# Patient Record
Sex: Female | Born: 1990 | Race: White | Hispanic: No | Marital: Single | Smoking: Former smoker
Health system: Southern US, Community
[De-identification: ages and names within clinical notes are randomized; demographics above are authoritative.]

## PROBLEM LIST (undated history)

## (undated) ENCOUNTER — Inpatient Hospital Stay (HOSPITAL_COMMUNITY): Payer: Self-pay

## (undated) ENCOUNTER — Inpatient Hospital Stay (HOSPITAL_COMMUNITY): Payer: BC Managed Care – PPO

## (undated) DIAGNOSIS — F32A Depression, unspecified: Secondary | ICD-10-CM

## (undated) DIAGNOSIS — I951 Orthostatic hypotension: Secondary | ICD-10-CM

## (undated) DIAGNOSIS — R87629 Unspecified abnormal cytological findings in specimens from vagina: Secondary | ICD-10-CM

## (undated) DIAGNOSIS — R350 Frequency of micturition: Secondary | ICD-10-CM

## (undated) DIAGNOSIS — F329 Major depressive disorder, single episode, unspecified: Secondary | ICD-10-CM

## (undated) DIAGNOSIS — R51 Headache: Secondary | ICD-10-CM

## (undated) DIAGNOSIS — R102 Pelvic and perineal pain: Secondary | ICD-10-CM

## (undated) DIAGNOSIS — R569 Unspecified convulsions: Secondary | ICD-10-CM

## (undated) DIAGNOSIS — F319 Bipolar disorder, unspecified: Secondary | ICD-10-CM

## (undated) DIAGNOSIS — R351 Nocturia: Secondary | ICD-10-CM

## (undated) DIAGNOSIS — Z8742 Personal history of other diseases of the female genital tract: Secondary | ICD-10-CM

## (undated) DIAGNOSIS — B009 Herpesviral infection, unspecified: Secondary | ICD-10-CM

## (undated) DIAGNOSIS — Z915 Personal history of self-harm: Secondary | ICD-10-CM

## (undated) DIAGNOSIS — F341 Dysthymic disorder: Secondary | ICD-10-CM

## (undated) DIAGNOSIS — Z9151 Personal history of suicidal behavior: Secondary | ICD-10-CM

## (undated) DIAGNOSIS — N3010 Interstitial cystitis (chronic) without hematuria: Secondary | ICD-10-CM

## (undated) HISTORY — DX: Headache: R51

---
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
## 2009-03-28 HISTORY — PX: DILATION AND CURETTAGE OF UTERUS: SHX78

## 2009-04-06 ENCOUNTER — Emergency Department (HOSPITAL_COMMUNITY): Admission: EM | Admit: 2009-04-06 | Discharge: 2009-04-07 | Payer: Self-pay | Admitting: Emergency Medicine

## 2009-05-25 IMAGING — CT CT PELVIS W/ CM
2 of 4 series · 16 of 46 positions shown, 18 images · IV contrast (agent unspecified)
Comparison: 12/18/2006

CT ABDOMEN

CLINICAL DATA: Known UTI, increasing suprapubic, mid abdominal,
and right flank pain, leukocytosis

CT ABDOMEN AND PELVIS WITH CONTRAST
TECHNIQUE: Multidetector CT imaging of the abdomen and pelvis was
performed using the standard protocol following bolus
administration of intravenous contrast. Sagittal and coronal MPR
images reconstructed from axial data set.
Contrast: Dilute oral contrast and 100 ml [id number]

[Series 2: rtn ap with st · axial · 0.62mm/px · z∈[-617,-232]mm · 13 of 85 slices shown, 15 images]
[im 4/85  soft-tissue]
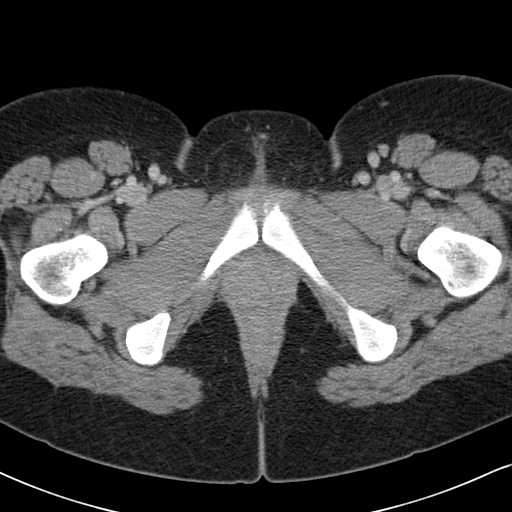
[im 4/85  bone]
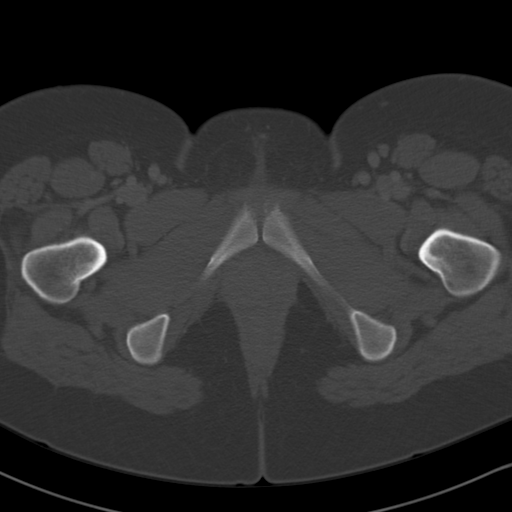
[im 10/85  soft-tissue]
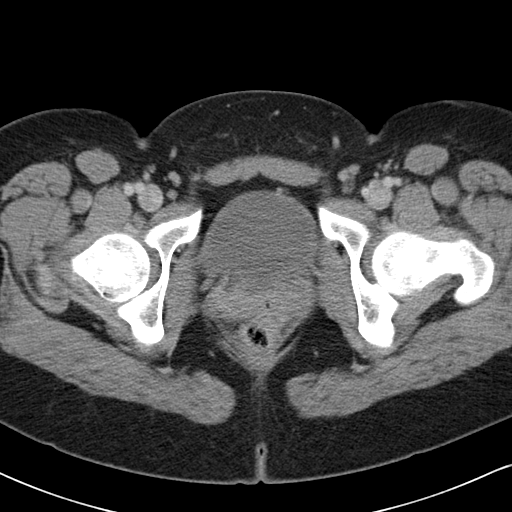
[im 17/85  soft-tissue]
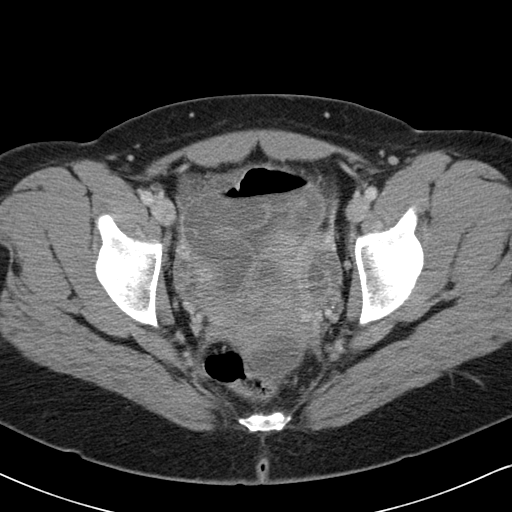
[im 23/85  soft-tissue]
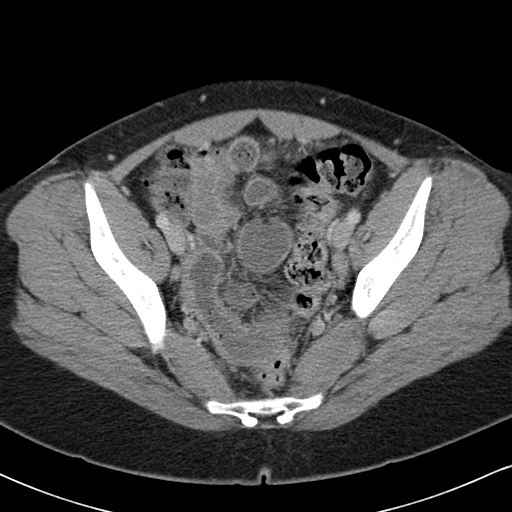
[im 30/85  soft-tissue]
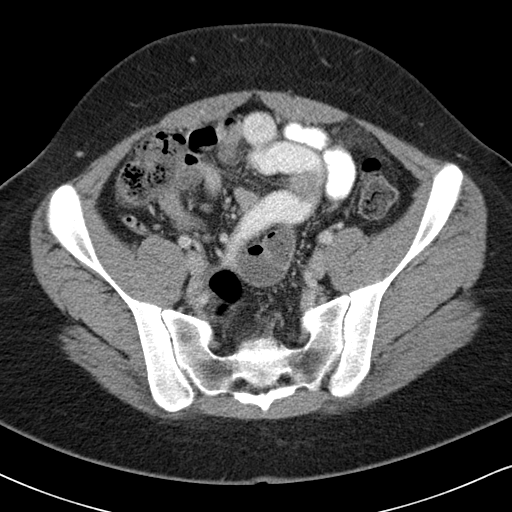
[im 36/85  soft-tissue]
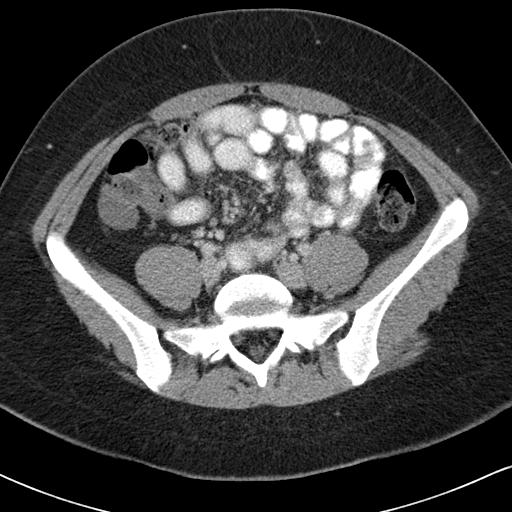
[im 43/85  soft-tissue]
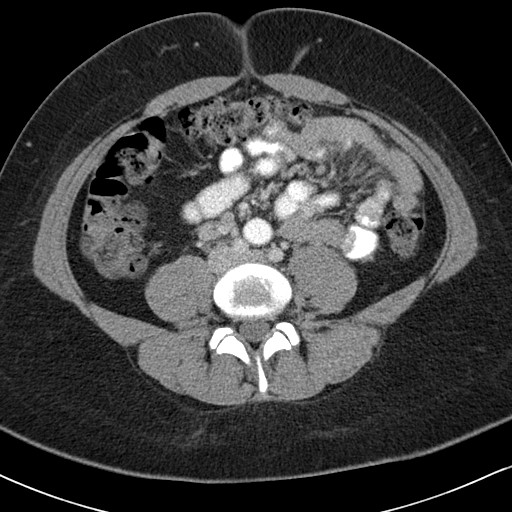
[im 49/85  soft-tissue]
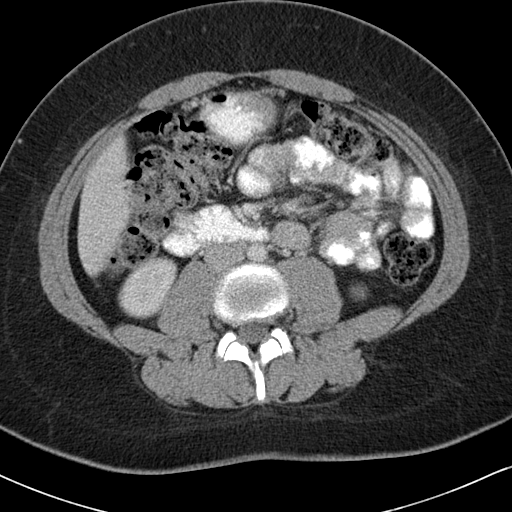
[im 55/85  soft-tissue]
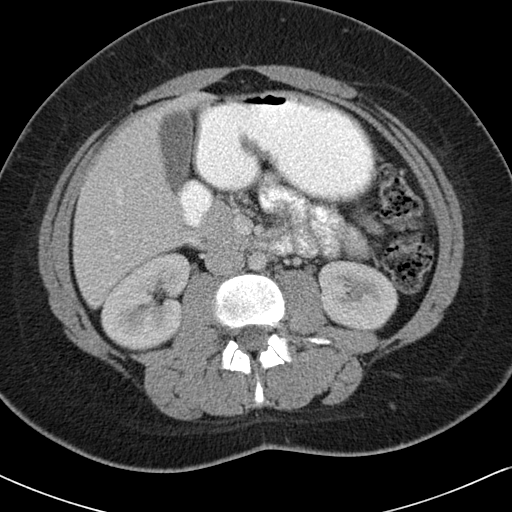
[im 55/85  bone]
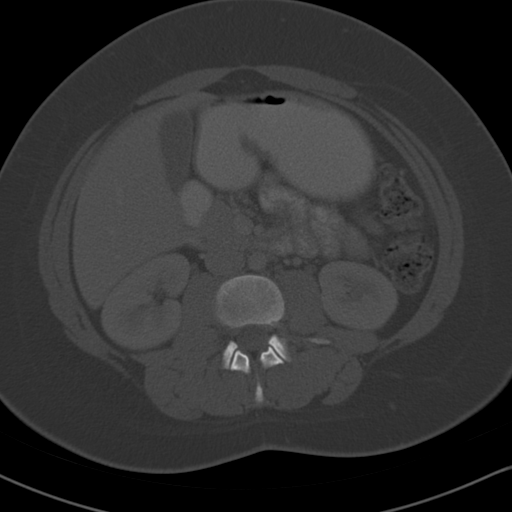
[im 62/85  soft-tissue]
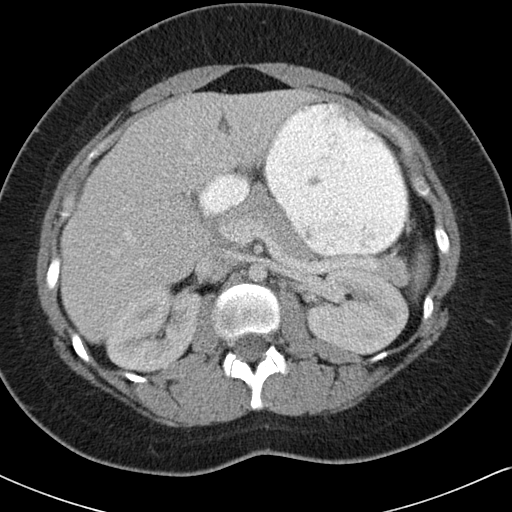
[im 68/85  soft-tissue]
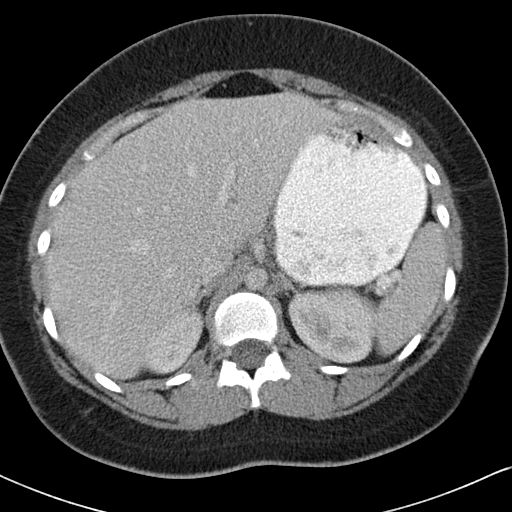
[im 75/85  soft-tissue]
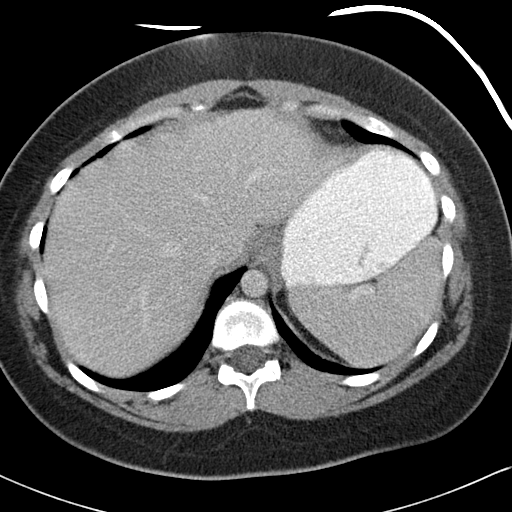
[im 81/85  soft-tissue]
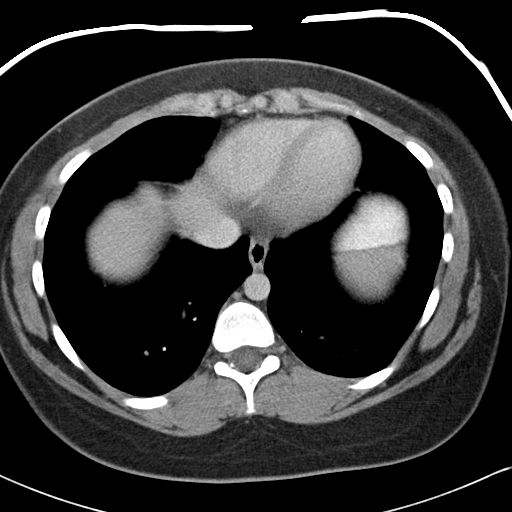

[Series 602: <mpr thick range> · coronal · 0.86mm/px · 3 of 73 slices shown]
[im 25/73  soft-tissue]
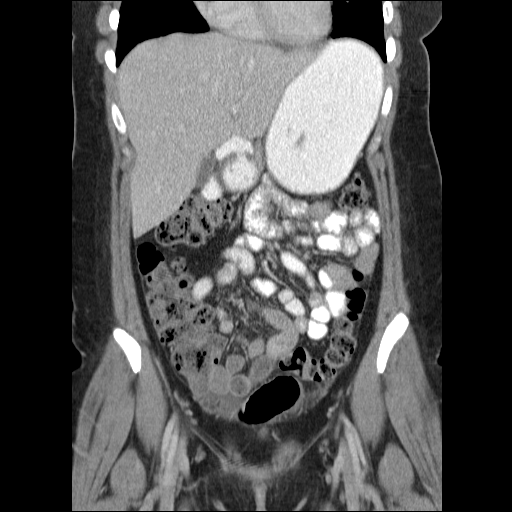
[im 33/73  soft-tissue]
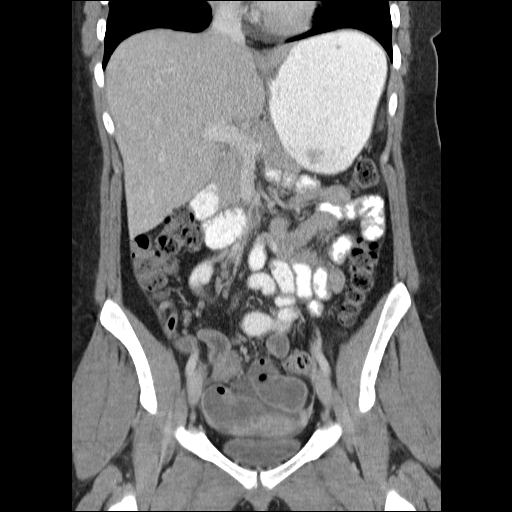
[im 41/73  soft-tissue]
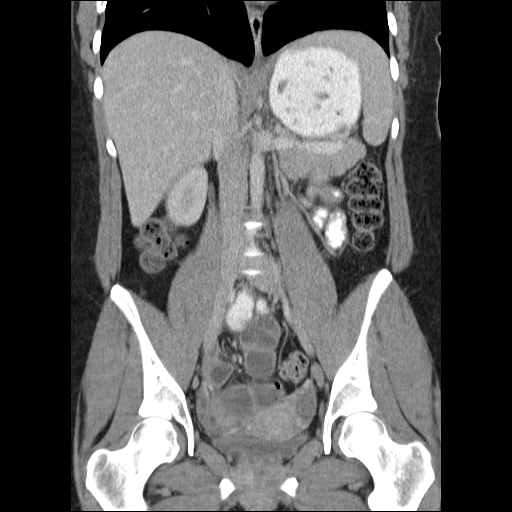

[16 of 46 positions shown; findings below may reference images not displayed]

FINDINGS: Lung bases clear.
Liver, spleen, pancreas, kidneys, and adrenal glands normal.
Symmetric nephrograms without mass, calcification, or
hydronephrosis.
No upper abdominal mass, adenopathy, or free fluid.
Scattered normal-sized lymph nodes throughout small bowel
mesentery, nonspecific.
IMPRESSION: No acute upper abdominal abnormalities, see below.

CT PELVIS
FINDINGS: Bones unremarkable.
Nonspecific free fluid in pelvis dependently and between pelvic
small bowel loops.
In addition, mild dilatation of small bowel loops in pelvis with
mildly thickened walls.
Colon unremarkable with normal appendix noted in upper right
pelvis.
Observed infiltrative changes surround uterus and adnexae without
discrete mass.
No urinary tract calcification or dilatation.
Small calcified pelvic phleboliths noted.
Minimal enhancement is identified at the peritoneal margins of the
cul-de-sac fluid.
Overall pattern is that of an inflammatory process in the pelvis,
suspect infectious, cannot exclude infection/abscess formation.
No free intraperitoneal air or evidence of bowel obstruction.
Ovaries grossly normal in size for patient of this age, containing
low attenuation foci which may represent follicles or small cysts.
Bones unremarkable.
IMPRESSION: Free fluid and apparent inflammatory process in pelvis suspicious
for infection, though uncertain as to whether this is of
gastrointestinal, gynecologic, or urinary origin.
Cannot exclude pelvic abscess.
Diffuse thickening of small bowel loops identified with hazy
mesenteric edema.

Critical test results telephoned to Dr. Stocker at the time of
interpretation on 06/19/2008 at 4585 hrs.

## 2009-08-01 ENCOUNTER — Emergency Department (HOSPITAL_COMMUNITY): Admission: EM | Admit: 2009-08-01 | Discharge: 2009-08-01 | Payer: Self-pay | Admitting: Emergency Medicine

## 2009-10-10 ENCOUNTER — Emergency Department (HOSPITAL_COMMUNITY): Admission: EM | Admit: 2009-10-10 | Discharge: 2009-10-10 | Payer: Self-pay | Admitting: Emergency Medicine

## 2009-11-23 ENCOUNTER — Ambulatory Visit: Payer: Self-pay | Admitting: Nurse Practitioner

## 2009-11-23 ENCOUNTER — Inpatient Hospital Stay (HOSPITAL_COMMUNITY): Admission: AD | Admit: 2009-11-23 | Discharge: 2009-11-23 | Payer: Self-pay | Admitting: Obstetrics and Gynecology

## 2009-11-23 DIAGNOSIS — O2390 Unspecified genitourinary tract infection in pregnancy, unspecified trimester: Secondary | ICD-10-CM

## 2009-11-23 DIAGNOSIS — N72 Inflammatory disease of cervix uteri: Secondary | ICD-10-CM

## 2009-11-23 DIAGNOSIS — N949 Unspecified condition associated with female genital organs and menstrual cycle: Secondary | ICD-10-CM

## 2009-11-25 ENCOUNTER — Inpatient Hospital Stay (HOSPITAL_COMMUNITY): Admission: AD | Admit: 2009-11-25 | Discharge: 2009-11-26 | Payer: Self-pay | Admitting: Obstetrics and Gynecology

## 2009-11-25 DIAGNOSIS — R109 Unspecified abdominal pain: Secondary | ICD-10-CM

## 2009-11-25 DIAGNOSIS — O2390 Unspecified genitourinary tract infection in pregnancy, unspecified trimester: Secondary | ICD-10-CM

## 2009-11-25 DIAGNOSIS — N760 Acute vaginitis: Secondary | ICD-10-CM

## 2009-11-25 DIAGNOSIS — A499 Bacterial infection, unspecified: Secondary | ICD-10-CM

## 2009-11-28 ENCOUNTER — Ambulatory Visit: Payer: Self-pay | Admitting: Gynecology

## 2009-11-28 ENCOUNTER — Inpatient Hospital Stay (HOSPITAL_COMMUNITY): Admission: AD | Admit: 2009-11-28 | Discharge: 2009-11-28 | Payer: Self-pay | Admitting: Obstetrics and Gynecology

## 2009-11-28 DIAGNOSIS — O368190 Decreased fetal movements, unspecified trimester, not applicable or unspecified: Secondary | ICD-10-CM

## 2009-12-12 ENCOUNTER — Inpatient Hospital Stay (HOSPITAL_COMMUNITY): Admission: AD | Admit: 2009-12-12 | Discharge: 2009-12-12 | Payer: Self-pay | Admitting: Obstetrics and Gynecology

## 2010-01-08 ENCOUNTER — Ambulatory Visit: Payer: Self-pay | Admitting: Obstetrics and Gynecology

## 2010-01-08 ENCOUNTER — Inpatient Hospital Stay (HOSPITAL_COMMUNITY): Admission: AD | Admit: 2010-01-08 | Discharge: 2010-01-08 | Payer: Self-pay | Admitting: Obstetrics and Gynecology

## 2010-01-17 ENCOUNTER — Ambulatory Visit: Payer: Self-pay | Admitting: Family

## 2010-01-17 ENCOUNTER — Inpatient Hospital Stay (HOSPITAL_COMMUNITY): Admission: AD | Admit: 2010-01-17 | Discharge: 2010-01-17 | Payer: Self-pay | Admitting: Obstetrics and Gynecology

## 2010-03-02 ENCOUNTER — Inpatient Hospital Stay (HOSPITAL_COMMUNITY)
Admission: AD | Admit: 2010-03-02 | Discharge: 2010-03-02 | Payer: Self-pay | Source: Home / Self Care | Admitting: Obstetrics and Gynecology

## 2010-03-14 ENCOUNTER — Inpatient Hospital Stay (HOSPITAL_COMMUNITY)
Admission: AD | Admit: 2010-03-14 | Discharge: 2010-03-14 | Payer: Self-pay | Source: Home / Self Care | Attending: Obstetrics and Gynecology | Admitting: Obstetrics and Gynecology

## 2010-03-25 ENCOUNTER — Inpatient Hospital Stay (HOSPITAL_COMMUNITY)
Admission: AD | Admit: 2010-03-25 | Discharge: 2010-03-25 | Payer: Self-pay | Source: Home / Self Care | Attending: Obstetrics and Gynecology | Admitting: Obstetrics and Gynecology

## 2010-03-30 ENCOUNTER — Inpatient Hospital Stay (HOSPITAL_COMMUNITY)
Admission: AD | Admit: 2010-03-30 | Discharge: 2010-04-02 | Payer: Self-pay | Source: Home / Self Care | Attending: Obstetrics and Gynecology | Admitting: Obstetrics and Gynecology

## 2010-04-01 LAB — CBC
HCT: 26.5 % — ABNORMAL LOW (ref 36.0–46.0)
Hemoglobin: 8.6 g/dL — ABNORMAL LOW (ref 12.0–15.0)
MCH: 26.9 pg (ref 26.0–34.0)
MCHC: 32.5 g/dL (ref 30.0–36.0)
MCV: 82.8 fL (ref 78.0–100.0)
Platelets: 215 10*3/uL (ref 150–400)
RBC: 3.2 MIL/uL — ABNORMAL LOW (ref 3.87–5.11)
RDW: 15.3 % (ref 11.5–15.5)
WBC: 11.9 10*3/uL — ABNORMAL HIGH (ref 4.0–10.5)

## 2010-04-02 ENCOUNTER — Encounter
Admission: RE | Admit: 2010-04-02 | Discharge: 2010-04-08 | Payer: Self-pay | Source: Home / Self Care | Attending: Obstetrics and Gynecology | Admitting: Obstetrics and Gynecology

## 2010-04-02 LAB — CBC
HCT: 27 % — ABNORMAL LOW (ref 36.0–46.0)
Hemoglobin: 8.6 g/dL — ABNORMAL LOW (ref 12.0–15.0)
MCH: 26.5 pg (ref 26.0–34.0)
MCHC: 31.9 g/dL (ref 30.0–36.0)
MCV: 83.3 fL (ref 78.0–100.0)
Platelets: 250 10*3/uL (ref 150–400)
RBC: 3.24 MIL/uL — ABNORMAL LOW (ref 3.87–5.11)
RDW: 15.4 % (ref 11.5–15.5)
WBC: 9.7 10*3/uL (ref 4.0–10.5)

## 2010-05-23 ENCOUNTER — Emergency Department (HOSPITAL_COMMUNITY): Payer: BC Managed Care – PPO

## 2010-05-23 ENCOUNTER — Emergency Department (HOSPITAL_COMMUNITY)
Admission: EM | Admit: 2010-05-23 | Discharge: 2010-05-24 | Disposition: A | Discharge status: Home / Self Care | Payer: BC Managed Care – PPO | Attending: Emergency Medicine | Admitting: Emergency Medicine

## 2010-05-23 DIAGNOSIS — R0789 Other chest pain: Secondary | ICD-10-CM | POA: Insufficient documentation

## 2010-05-23 LAB — DIFFERENTIAL
Basophils Absolute: 0 10*3/uL (ref 0.0–0.1)
Basophils Relative: 0 % (ref 0–1)
Eosinophils Absolute: 0.2 10*3/uL (ref 0.0–0.7)
Eosinophils Relative: 2 % (ref 0–5)
Lymphocytes Relative: 46 % (ref 12–46)
Lymphs Abs: 3.7 10*3/uL (ref 0.7–4.0)
Monocytes Absolute: 0.6 10*3/uL (ref 0.1–1.0)
Monocytes Relative: 8 % (ref 3–12)
Neutro Abs: 3.5 10*3/uL (ref 1.7–7.7)
Neutrophils Relative %: 44 % (ref 43–77)

## 2010-05-23 LAB — CBC
Hemoglobin: 12.1 g/dL (ref 12.0–15.0)
RBC: 4.72 MIL/uL (ref 3.87–5.11)
WBC: 8 10*3/uL (ref 4.0–10.5)

## 2010-05-23 LAB — POCT CARDIAC MARKERS
CKMB, poc: 1.3 ng/mL (ref 1.0–8.0)
Myoglobin, poc: 48 ng/mL (ref 12–200)
Troponin i, poc: <0.05 ng/mL (ref 0.00–0.09)

## 2010-05-24 LAB — URINALYSIS, ROUTINE W REFLEX MICROSCOPIC
Nitrite: NEGATIVE
Protein, ur: NEGATIVE mg/dL
Specific Gravity, Urine: 1.025 (ref 1.005–1.030)
Urobilinogen, UA: 1 mg/dL (ref 0.0–1.0)

## 2010-05-24 LAB — URINE MICROSCOPIC-ADD ON

## 2010-05-24 LAB — POCT PREGNANCY, URINE: Preg Test, Ur: NEGATIVE

## 2010-05-24 LAB — D-DIMER, QUANTITATIVE: D-Dimer, Quant: 0.47 ug/mL-FEU (ref 0.00–0.48)

## 2010-06-07 LAB — URINALYSIS, ROUTINE W REFLEX MICROSCOPIC
Bilirubin Urine: NEGATIVE
Glucose, UA: NEGATIVE mg/dL
Ketones, ur: NEGATIVE mg/dL
Specific Gravity, Urine: 1.025 (ref 1.005–1.030)
pH: 5.5 (ref 5.0–8.0)

## 2010-06-07 LAB — CBC
HCT: 37 % (ref 36.0–46.0)
Hemoglobin: 11.9 g/dL — ABNORMAL LOW (ref 12.0–15.0)
Hemoglobin: 12.2 g/dL (ref 12.0–15.0)
MCH: 26.3 pg (ref 26.0–34.0)
MCH: 26.5 pg (ref 26.0–34.0)
MCHC: 32.2 g/dL (ref 30.0–36.0)
MCHC: 32.4 g/dL (ref 30.0–36.0)
MCV: 81.9 fL (ref 78.0–100.0)
Platelets: 232 10*3/uL (ref 150–400)
Platelets: 243 10*3/uL (ref 150–400)
RBC: 4.52 MIL/uL (ref 3.87–5.11)
RDW: 14.7 % (ref 11.5–15.5)
WBC: 11.2 10*3/uL — ABNORMAL HIGH (ref 4.0–10.5)

## 2010-06-07 LAB — COMPREHENSIVE METABOLIC PANEL
ALT: 32 U/L (ref 0–35)
AST: 33 U/L (ref 0–37)
Albumin: 2.5 g/dL — ABNORMAL LOW (ref 3.5–5.2)
CO2: 21 mEq/L (ref 19–32)
Calcium: 9.1 mg/dL (ref 8.4–10.5)
Creatinine, Ser: 0.47 mg/dL (ref 0.4–1.2)
GFR calc Af Amer: >60 mL/min (ref >60)
GFR calc non Af Amer: >60 mL/min (ref >60)
Sodium: 134 mEq/L — ABNORMAL LOW (ref 135–145)
Total Protein: 5.9 g/dL — ABNORMAL LOW (ref 6.0–8.3)

## 2010-06-07 LAB — URINE MICROSCOPIC-ADD ON

## 2010-06-07 LAB — RPR: RPR Ser Ql: NONREACTIVE

## 2010-06-11 LAB — URINALYSIS, ROUTINE W REFLEX MICROSCOPIC
Bilirubin Urine: NEGATIVE
Glucose, UA: NEGATIVE mg/dL
Hgb urine dipstick: NEGATIVE
Ketones, ur: NEGATIVE mg/dL
Ketones, ur: NEGATIVE mg/dL
Nitrite: NEGATIVE
Protein, ur: NEGATIVE mg/dL
Protein, ur: NEGATIVE mg/dL
pH: 6 (ref 5.0–8.0)
pH: 6 (ref 5.0–8.0)

## 2010-06-11 LAB — WET PREP, GENITAL
Clue Cells Wet Prep HPF POC: NONE SEEN
Trich, Wet Prep: NONE SEEN
Yeast Wet Prep HPF POC: NONE SEEN

## 2010-06-11 LAB — GC/CHLAMYDIA PROBE AMP, GENITAL
Chlamydia, DNA Probe: NEGATIVE
GC Probe Amp, Genital: NEGATIVE

## 2010-06-12 LAB — WET PREP, GENITAL
Clue Cells Wet Prep HPF POC: NONE SEEN
Yeast Wet Prep HPF POC: NONE SEEN

## 2010-06-12 LAB — RPR: RPR Ser Ql: NONREACTIVE

## 2010-06-13 LAB — DIFFERENTIAL
Eosinophils Absolute: 0.2 10*3/uL (ref 0.0–0.7)
Eosinophils Relative: 2 % (ref 0–5)
Lymphocytes Relative: 33 % (ref 12–46)
Lymphs Abs: 3.3 10*3/uL (ref 0.7–4.0)
Monocytes Absolute: 0.8 10*3/uL (ref 0.1–1.0)

## 2010-06-13 LAB — URINALYSIS, ROUTINE W REFLEX MICROSCOPIC
Glucose, UA: NEGATIVE mg/dL
Hgb urine dipstick: NEGATIVE
Specific Gravity, Urine: 1.015 (ref 1.005–1.030)
pH: 6 (ref 5.0–8.0)

## 2010-06-13 LAB — URINE MICROSCOPIC-ADD ON

## 2010-06-13 LAB — CBC
HCT: 36.1 % (ref 36.0–46.0)
Hemoglobin: 11.9 g/dL — ABNORMAL LOW (ref 12.0–15.0)
MCV: 87.5 fL (ref 78.0–100.0)
WBC: 10 10*3/uL (ref 4.0–10.5)

## 2010-06-15 LAB — POCT PREGNANCY, URINE: Preg Test, Ur: POSITIVE

## 2010-06-15 LAB — GC/CHLAMYDIA PROBE AMP, GENITAL
Chlamydia, DNA Probe: NEGATIVE
GC Probe Amp, Genital: NEGATIVE

## 2010-06-15 LAB — URINALYSIS, ROUTINE W REFLEX MICROSCOPIC
Hgb urine dipstick: NEGATIVE
Nitrite: NEGATIVE
Protein, ur: NEGATIVE mg/dL
Urobilinogen, UA: 0.2 mg/dL (ref 0.0–1.0)

## 2010-06-15 LAB — CBC
HCT: 37 % (ref 36.0–46.0)
MCV: 87.7 fL (ref 78.0–100.0)
Platelets: 309 10*3/uL (ref 150–400)
RDW: 12.3 % (ref 11.5–15.5)

## 2010-06-15 LAB — WET PREP, GENITAL: Yeast Wet Prep HPF POC: NONE SEEN

## 2010-07-08 LAB — COMPREHENSIVE METABOLIC PANEL
ALT: 16 U/L (ref 0–35)
Alkaline Phosphatase: 57 U/L (ref 39–117)
CO2: 28 mEq/L (ref 19–32)
Chloride: 102 mEq/L (ref 96–112)
GFR calc non Af Amer: >60 mL/min (ref >60)
Glucose, Bld: 106 mg/dL — ABNORMAL HIGH (ref 70–99)
Potassium: 3.9 mEq/L (ref 3.5–5.1)
Sodium: 136 mEq/L (ref 135–145)

## 2010-07-08 LAB — URINE MICROSCOPIC-ADD ON

## 2010-07-08 LAB — DIFFERENTIAL
Basophils Relative: 1 % (ref 0–1)
Eosinophils Absolute: 0.2 10*3/uL (ref 0.0–0.7)
Monocytes Relative: 9 % (ref 3–12)
Neutrophils Relative %: 69 % (ref 43–77)

## 2010-07-08 LAB — URINALYSIS, ROUTINE W REFLEX MICROSCOPIC
Glucose, UA: NEGATIVE mg/dL
Ketones, ur: NEGATIVE mg/dL
Protein, ur: NEGATIVE mg/dL

## 2010-07-08 LAB — CBC
Hemoglobin: 12.3 g/dL (ref 12.0–15.0)
RBC: 4.35 MIL/uL (ref 3.87–5.11)

## 2010-07-08 LAB — WET PREP, GENITAL
Clue Cells Wet Prep HPF POC: NONE SEEN
Trich, Wet Prep: NONE SEEN
Yeast Wet Prep HPF POC: NONE SEEN

## 2010-07-08 LAB — GC/CHLAMYDIA PROBE AMP, GENITAL: Chlamydia, DNA Probe: POSITIVE — AB

## 2010-07-08 LAB — LIPASE, BLOOD: Lipase: 17 U/L (ref 11–59)

## 2010-07-13 LAB — URINALYSIS, ROUTINE W REFLEX MICROSCOPIC
Ketones, ur: 15 mg/dL — AB
Nitrite: NEGATIVE
Protein, ur: NEGATIVE mg/dL
Urobilinogen, UA: 0.2 mg/dL (ref 0.0–1.0)
pH: 5.5 (ref 5.0–8.0)

## 2010-07-13 LAB — DIFFERENTIAL
Eosinophils Absolute: 0.1 10*3/uL (ref 0.0–0.7)
Eosinophils Relative: 1 % (ref 0–5)
Lymphocytes Relative: 25 % (ref 12–46)
Lymphs Abs: 3.4 10*3/uL (ref 0.7–4.0)
Monocytes Relative: 6 % (ref 3–12)
Neutrophils Relative %: 68 % (ref 43–77)

## 2010-07-13 LAB — ETHANOL: Alcohol, Ethyl (B): <5 mg/dL (ref 0–10)

## 2010-07-13 LAB — RAPID URINE DRUG SCREEN, HOSP PERFORMED
Amphetamines: NOT DETECTED
Benzodiazepines: NOT DETECTED
Cocaine: NOT DETECTED
Tetrahydrocannabinol: NOT DETECTED

## 2010-07-13 LAB — BASIC METABOLIC PANEL
Chloride: 105 mEq/L (ref 96–112)
GFR calc Af Amer: >60 mL/min (ref >60)
GFR calc non Af Amer: >60 mL/min (ref >60)
Potassium: 3.8 mEq/L (ref 3.5–5.1)
Sodium: 139 mEq/L (ref 135–145)

## 2010-07-13 LAB — POCT PREGNANCY, URINE: Preg Test, Ur: NEGATIVE

## 2010-07-13 LAB — CBC
HCT: 39.2 % (ref 36.0–46.0)
MCV: 86.3 fL (ref 78.0–100.0)
RBC: 4.55 MIL/uL (ref 3.87–5.11)
WBC: 13.4 10*3/uL — ABNORMAL HIGH (ref 4.0–10.5)

## 2010-08-10 NOTE — Op Note (Signed)
NAME:  Kathy [name], Kathy [name]           ACCOUNT NO.:  [id number]   MEDICAL RECORD NO.:  [id number]          PATIENT TYPE:  AMB   LOCATION:  DSC                          FACILITY:  [facility]   PHYSICIAN:  Jefry H. Pollyann Kennedy, MD     DATE OF BIRTH:  1990/11/24   DATE OF PROCEDURE:  09/10/2007  DATE OF DISCHARGE:                               OPERATIVE REPORT   PREOPERATIVE DIAGNOSIS:  Chronic tonsillitis.   POSTOPERATIVE DIAGNOSIS:  Chronic tonsillitis.   PROCEDURE:  Tonsillectomy.   SURGEON:  Jefry H. Pollyann Kennedy, MD   ANESTHESIA:  General endotracheal anesthesia was used.   COMPLICATIONS:  None.   FINDINGS:  Large cryptic tonsils.  Nasopharynx clear.   BLOOD LOSS:  None.   REFERRING PHYSICIAN:  Ursula Beath, MD   HISTORY:  An 20 year old with a history of chronic and recurring  tonsillar pharyngitis.  Risks, benefits, alternatives, and complications  of the procedure were explained to the patient and her parents.  She  seemed to understand and agreed to surgery.   PROCEDURE:  The patient was taken to the operating room and placed on  the operating table in supine position.  Following induction of general  endotracheal anesthesia, the table was turned and the patient was draped  in a standard fashion.  A Crowe-Davis mouth gag was inserted into the  oral cavity used to retract the tongue and mandible and attached to Mayo  stand.  Inspection of the palate revealed no evidence of a submucous  cleft or shortening of the soft palate.  Red rubber catheter was  inserted into the right side of the nose, withdrawn through the mouth  and used to retract the soft palate and uvula.  Exam of the nasopharynx  revealed no abnormal findings.  The tonsillectomy was performed using  electrocautery dissection carefully dissecting the avascular plane  between the capsule and the constrictor muscles.  Tonsils were sent  together for pathologic evaluation.  Cautery was used for  completion of hemostasis.   The oral cavity and pharynx was irrigated  with saline and suction.  An orogastric tube was used to aspirate the  contents of the stomach.  The patient was then awakened, extubated, and  transferred to recovery in stable condition.      Jefry H. Pollyann Kennedy, MD  Electronically Signed     JHR/MEDQ  D:  09/10/2007  T:  09/10/2007  Job:  [id number]   cc:   Ursula Beath, MD

## 2010-08-13 NOTE — H&P (Signed)
NAME:  Kathy [name], Kathy [name]           ACCOUNT NO.:  [id number]   MEDICAL RECORD NO.:  [id number]          PATIENT TYPE:  INP   LOCATION:  0100                          [facility]ACILITY:  [facility]   PHYSICIAN:  Lalla Brothers, MDDATE OF BIRTH:  November 11, 1990   DATE OF ADMISSION:  05/06/2004  DATE OF DISCHARGE:                         PSYCHIATRIC ADMISSION ASSESSMENT   IDENTIFICATION:  This 87-1/20-year-old female 9th grade student at [employer] is admitted emergently voluntarily from behavioral center access  and intake where she was brought by the adoptive parents for inpatient  stabilization and treatment of suicide attempt and depression. The patient  had overdosed with 4-7 OxyContin and 4 Advil of her father's and had slumped  to the floor twice at home. She had a previous suicide attempt by smothering  herself and has a history of self-cutting including an acute laceration on  the left wrist.   HISTORY OF PRESENT ILLNESS:  The patient and mother described that the  patient has had many years of low self-esteem and self-doubt in a dependent  pattern. The patient has been rather pervasively depressed now for the last  2 years reportedly since the 7th grade year in school. The patient has  dissatisfaction and disappointment with herself, her life and her future.  She has a significant lack of fulfillment despite the encouragement and  redirection of others. She is difficult to work with in that way. The  patient formulates that she was given therapy 2 years ago but not allowed  consideration of medication at that time. She is admitted at this time on  Wellbutrin 300 mg XL every morning as well as Adderall 20 mg XR every  morning, taking the Adderall for ADHD in the Wellbutrin apparently for  depression. The patient suggests that she had some therapy at a around age  [age] as well. Adoptive mother indicates that [facility] has been  their most helpful therapy resource though she  is now retired. The patient  has several months now of increasing depression and fixation and  hopelessness and helplessness. She maintains that her adoptive parents are  overbearing, overly critical, and expecting too much of her. The patient  indicates that she would have taken more pills have a friend did not  interrupt her verbally and stop her from taking further. Mother states the  patient estimated ingesting 4-7 of the OxyContin tablets of father's but  they can only document that as many as 4 were missing. The patient asked why  her biological mother at age 27 after the patient's birth what maltreat the  patient and give her up for adoption. The patient seems to formulate that  her biological mother now has young child and that the biological father has  three children. The patient continues to ask over and over when they did not  keep her. The patient seems to propose that others are withholding nurturing  and concern from her. She does not acknowledge specific anxiety. She does  report inattention in the past but does not describe hyperactivity. She has  significant dependent features to her identity diffusion and confusion. She  does  not acknowledge hallucinations or delusions.   PAST MEDICAL HISTORY:  The patient is under the primary care of Dr. Evelena Peat at that [facility]. She has a history of  chickenpox. She does have as asthma episodes when she has upper respiratory  infections. She has occasional strep throat. She is mildly overweight but  mother describes a the patient was losing some weight on Adderall and  Wellbutrin leveled this off, while the patient states that neither really  affected her appetite, but she just stop eating apparently because of  depression. She has a laceration on the left wrist. She has scars from  previous self-lacerations on the left forearm. She has some acne vulgaris.  Her last menses was 05/03/2004. She is not sexually  active. She has no  medication allergies. At the time of admission she is taking Wellbutrin 300  mg XL every morning and Adderall 20 mg XR every morning. She has had no  seizure or syncope. She has had no heart murmur or arrhythmia.   REVIEW OF SYSTEMS:  The patient denies difficulty with gait, gaze or  continence. She denies exposure to communicable disease or toxins. She  denies rash, jaundice or purpura. There is no chest pain, palpitations or  presyncope. There is no abdominal pain, nausea, vomiting or diarrhea. There  is no dysuria or arthralgia.   IMMUNIZATIONS:  Are up-to-date.   FAMILY HISTORY:  Biological mother was age 77 when the patient was born and  was maltreating to the patient. The patient was subsequently adopted and  currently resides with biological parents. The patient is concerned the  biological mother has her own young child now and the biological father has  three children which they did keep. Nothing is known, otherwise, about her  biological family.   SOCIAL AND DEVELOPMENTAL HISTORY:  The patient is a 9th grade student at  [employer]. Adaptation to high school appears to have been  challenging particular developmentally. The patient is dressed in a morbid  fashion having stars inked in on all fingers and wearing black with heavy  black makeup and she had black hair. The patient denies use of illicit drugs  or alcohol. She denies sexual activity. She denies legal problems.   ASSETS:  The patient thinks she cannot talk in any clarifying way about her  feelings and past, but she does so quite successfully in the interview and  intervention.   MENTAL STATUS EXAM:  Height 65 inches and weight is 160 pounds with blood  pressure 137/79, heart rate 72 sitting and 130/84 with heart rate of 94  standing. The patient is right-handed. Neurological exam is intact. She is  alert and oriented with speech intact. Cranial nerves II-XII intact. Alternating  motion rates are intact. Muscle strength and tone are normal.  There is no abnormal involuntary movements. There is no neurologic soft  signs or pathologic reflexes. Gait and gaze are intact. The patient dresses  in a morbid fashion but is hopeless in her mannerism. She understates her  capability for sharing her own emotions and the content of her loss. She  presents low self-esteem and self-deprecation. She suggests a 2-year history  of chronic dysthymic dysphoria now exacerbated over a couple months with  apparent major depression. She seems to project inadequate nurturing and  validation from parents currently considering this is stressful and  critical. However, object loss extends to biological parents predominantly.  She has no hallucinations or delusions. She has no  dissociation or cognitive  impairment. She is inattentive though she is seen after taking her Adderall  making quantitation difficult. She does not have hyperactivity at this time.  She does have suicidal ideation and attempt. She is not homicidal or  assaultive   IMPRESSION:   AXIS I:  1.  Major depression, single episode, moderate to severe.  2.  Dysthymic disorder, early onset, severe with atypical features.  3.  Attention deficit hyperactivity disorder, predominantly inattentive      type, moderate severity.  4.  Identity disorder with dependent features.  5.  Parent-child problem.  6.  Other specified family circumstances.   AXIS II:  Diagnosis deferred.   AXIS III:  1.  Laceration, left wrist.  2.  Asthma by history.  3.  Slightly overweight.   AXIS IV:  Stressors family severe chronic; phase of life severe acute.   AXIS V:  Global assessment of function 35 with highest in the last year 73.   PLAN:  The patient is admitted for inpatient adolescent psychiatric and  multidisciplinary multimodal behavioral treatment in a team-based  programmatic locked psychiatric unit. I discussed Wellbutrin  pharmacotherapy  with the patient and adoptive mother separately. We will increase Wellbutrin  to 450 mg XL every morning. We will maintain Adderall currently 20 mg XR  every morning though it may be possible to discontinue Adderall when  Wellbutrin as titrated up. If Wellbutrin is not successful for mood and this  dose, then it may be best to change to a different antidepressant. Learning-  based strategies, cognitive behavioral, object relations and family,  identity consolidation, individuation separation habit reversal training  therapies are be considered and undertaken. Estimated length of stay is 5-7  days with target symptoms for discharge being stabilization of suicide risk  and mood, stabilization of neurotic perpetuation of depression in treatment  failure and generalization of the capacity for safe effective participation  in outpatient treatment.      GEJ/MEDQ  D:  05/07/2004  T:  05/07/2004  Job:  [id number]
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
## 2010-09-23 ENCOUNTER — Other Ambulatory Visit: Payer: Self-pay | Admitting: Obstetrics and Gynecology

## 2010-09-24 ENCOUNTER — Ambulatory Visit
Admission: RE | Admit: 2010-09-24 | Discharge: 2010-09-24 | Disposition: A | Discharge status: Home / Self Care | Payer: BC Managed Care – PPO | Source: Ambulatory Visit | Attending: Obstetrics and Gynecology | Admitting: Obstetrics and Gynecology

## 2010-12-23 ENCOUNTER — Other Ambulatory Visit: Payer: Self-pay | Admitting: Family Medicine

## 2010-12-23 DIAGNOSIS — R221 Localized swelling, mass and lump, neck: Secondary | ICD-10-CM

## 2010-12-24 ENCOUNTER — Ambulatory Visit
Admission: RE | Admit: 2010-12-24 | Discharge: 2010-12-24 | Disposition: A | Discharge status: Home / Self Care | Payer: BC Managed Care – PPO | Source: Ambulatory Visit | Attending: Family Medicine | Admitting: Family Medicine

## 2010-12-24 DIAGNOSIS — R221 Localized swelling, mass and lump, neck: Secondary | ICD-10-CM

## 2011-01-06 LAB — DIFFERENTIAL
Basophils Absolute: 0
Basophils Relative: 0
Eosinophils Relative: 0
Monocytes Absolute: 0.7

## 2011-01-06 LAB — COMPREHENSIVE METABOLIC PANEL
AST: 20
Albumin: 3.8
Alkaline Phosphatase: 43 — ABNORMAL LOW
BUN: 14
CO2: 26
Chloride: 101
Potassium: 4.4
Total Bilirubin: 0.5

## 2011-01-06 LAB — URINALYSIS, ROUTINE W REFLEX MICROSCOPIC
Glucose, UA: NEGATIVE
Hgb urine dipstick: NEGATIVE
Specific Gravity, Urine: 1.022

## 2011-01-06 LAB — CBC
HCT: 37.1
MCV: 82.4
Platelets: 360 — ABNORMAL HIGH
RBC: 4.5
WBC: 11.9 — ABNORMAL HIGH

## 2011-01-06 LAB — WET PREP, GENITAL

## 2011-01-06 LAB — GC/CHLAMYDIA PROBE AMP, GENITAL: GC Probe Amp, Genital: NEGATIVE

## 2011-01-06 LAB — POCT PREGNANCY, URINE: Operator id: 29727

## 2011-04-15 ENCOUNTER — Inpatient Hospital Stay (HOSPITAL_COMMUNITY): Payer: BC Managed Care – PPO

## 2011-04-15 ENCOUNTER — Encounter (HOSPITAL_COMMUNITY): Payer: Self-pay

## 2011-04-15 ENCOUNTER — Inpatient Hospital Stay (HOSPITAL_COMMUNITY)
Admission: AD | Admit: 2011-04-15 | Discharge: 2011-04-15 | Disposition: A | Discharge status: Home / Self Care | Payer: BC Managed Care – PPO | Source: Ambulatory Visit | Attending: Obstetrics and Gynecology | Admitting: Obstetrics and Gynecology

## 2011-04-15 DIAGNOSIS — R109 Unspecified abdominal pain: Secondary | ICD-10-CM | POA: Insufficient documentation

## 2011-04-15 DIAGNOSIS — O209 Hemorrhage in early pregnancy, unspecified: Secondary | ICD-10-CM

## 2011-04-15 HISTORY — DX: Orthostatic hypotension: I95.1

## 2011-04-15 HISTORY — DX: Major depressive disorder, single episode, unspecified: F32.9

## 2011-04-15 HISTORY — DX: Depression, unspecified: F32.A

## 2011-04-15 LAB — CBC
Hemoglobin: 10.9 g/dL — ABNORMAL LOW (ref 12.0–15.0)
MCHC: 32.7 g/dL (ref 30.0–36.0)
RBC: 3.94 MIL/uL (ref 3.87–5.11)
WBC: 7 10*3/uL (ref 4.0–10.5)

## 2011-04-15 LAB — URINALYSIS, ROUTINE W REFLEX MICROSCOPIC
Leukocytes, UA: NEGATIVE
Nitrite: NEGATIVE
Specific Gravity, Urine: >1.03 — ABNORMAL HIGH (ref 1.005–1.030)
pH: 6 (ref 5.0–8.0)

## 2011-04-15 LAB — WET PREP, GENITAL
Clue Cells Wet Prep HPF POC: NONE SEEN
Trich, Wet Prep: NONE SEEN
Yeast Wet Prep HPF POC: NONE SEEN

## 2011-04-15 LAB — HCG, QUANTITATIVE, PREGNANCY: hCG, Beta Chain, Quant, S: 21451 m[IU]/mL — ABNORMAL HIGH (ref <5)

## 2011-04-15 LAB — URINE MICROSCOPIC-ADD ON

## 2011-04-15 NOTE — Progress Notes (Signed)
Patient states she started spotting and cramping last night and it continues. Patient is not wearing a pad at this time.

## 2011-04-15 NOTE — ED Notes (Signed)
Labs and [place] reviewed with NP

## 2011-04-15 NOTE — ED Provider Notes (Signed)
History     Chief Complaint  Patient presents with  . Abdominal Cramping   HPI  Pt is early pregnant ?[redacted]w[redacted]d by LMP 03/08/2011.  However, that was not a normal period and pt is unsure how far along she is.  She has a history of miscarriage and has been followed in th office with serial Hcg s which pt says are rising appropriately.  Pt has had increase in pain/cramping with some spotting yesterday and today.    Past Medical History  Diagnosis Date  . Orthostatic hypotension   . Bradycardia   . Asthma   . Urinary tract infection   . Depression   . Ovarian cyst   . PID (pelvic inflammatory disease)   . Abnormal Pap smear   . Chlamydia     Past Surgical History  Procedure Date  . Dilation and curettage of uterus     Family History  Problem Relation Age of Onset  . Adopted: Yes    History  Substance Use Topics  . Smoking status: Former Games developer  . Smokeless tobacco: Never Used   Comment: with positive preg test  . Alcohol Use: No    Allergies: No Known Allergies  Prescriptions prior to admission  Medication Sig Dispense Refill  . acetaminophen (TYLENOL) 325 MG tablet Take 325 mg by mouth every 6 (six) hours as needed. For pain and fever.      . fish oil-omega-3 fatty acids 1000 MG capsule Take 1 g by mouth daily.      . Prenatal Vit-Fe Fumarate-FA (PRENATAL MULTIVITAMIN) TABS Take 1 tablet by mouth daily.        Review of Systems  Constitutional: Positive for fever.  HENT: Positive for congestion.    Physical Exam   Blood pressure 105/72, pulse 85, temperature 98.1 F (36.7 C), temperature source Oral, resp. rate 16, height 5\' 4"  (1.626 m), weight 172 lb 9.6 oz (78.291 kg), last menstrual period 03/08/2011, SpO2 99.00%, unknown if currently breastfeeding.  Physical Exam  Vitals reviewed. Constitutional: She is oriented to person, place, and time. She appears well-developed and well-nourished.  Eyes: Pupils are equal, round, and reactive to light.  Neck: Normal  range of motion. Neck supple.  Cardiovascular: Normal rate.   Respiratory: Effort normal.  GI: Soft. She exhibits no distension. There is no tenderness. There is no rebound and no guarding.  Genitourinary: Vagina normal and uterus normal. No vaginal discharge found.       No blood noted in vault; uterus slightly tender with bimanual - uterus and adnexa without palpable enlargement but difficult to evaluate due to habitus  Musculoskeletal: Normal range of motion.  Neurological: She is alert and oriented to person, place, and time.  Skin: Skin is warm and dry.  Psychiatric: She has a normal mood and affect.    MAU Course  Procedures Results for orders placed during the hospital encounter of 04/15/11 (from the past 24 hour(s))  URINALYSIS, ROUTINE W REFLEX MICROSCOPIC     Status: Abnormal   Collection Time   04/15/11  8:24 AM      Component Value Range   Color, Urine YELLOW  YELLOW    APPearance CLEAR  CLEAR    Specific Gravity, Urine >1.030 (*) 1.005 - 1.030    pH 6.0  5.0 - 8.0    Glucose, UA NEGATIVE  NEGATIVE (mg/dL)   Hgb urine dipstick TRACE (*) NEGATIVE    Bilirubin Urine NEGATIVE  NEGATIVE    Ketones, ur NEGATIVE  NEGATIVE (  mg/dL)   Protein, ur NEGATIVE  NEGATIVE (mg/dL)   Urobilinogen, UA 0.2  0.0 - 1.0 (mg/dL)   Nitrite NEGATIVE  NEGATIVE    Leukocytes, UA NEGATIVE  NEGATIVE   URINE MICROSCOPIC-ADD ON     Status: Normal   Collection Time   04/15/11  8:24 AM      Component Value Range   Squamous Epithelial / LPF RARE  RARE    WBC, UA 0-2  <3 (WBC/hpf)   RBC / HPF 0-2  <3 (RBC/hpf)   Urine-Other MUCOUS PRESENT    CBC     Status: Abnormal   Collection Time   04/15/11  8:32 AM      Component Value Range   WBC 7.0  4.0 - 10.5 (K/uL)   RBC 3.94  3.87 - 5.11 (MIL/uL)   Hemoglobin 10.9 (*) 12.0 - 15.0 (g/dL)   HCT 16.1 (*) 09.6 - 46.0 (%)   MCV 84.5  78.0 - 100.0 (fL)   MCH 27.7  26.0 - 34.0 (pg)   MCHC 32.7  30.0 - 36.0 (g/dL)   RDW 04.5  40.9 - 81.1 (%)   Platelets  245  150 - 400 (K/uL)  HCG, QUANTITATIVE, PREGNANCY     Status: Abnormal   Collection Time   04/15/11  8:32 AM      Component Value Range   hCG, Beta Chain, Quant, S 91478 (*) <5 (mIU/mL)  WET PREP, GENITAL     Status: Abnormal   Collection Time   04/15/11  8:35 AM      Component Value Range   Yeast, Wet Prep NONE SEEN  NONE SEEN    Trich, Wet Prep NONE SEEN  NONE SEEN    Clue Cells, Wet Prep NONE SEEN  NONE SEEN    WBC, Wet Prep HPF POC FEW (*) NONE SEEN   ultrasound showed [redacted]w[redacted]d IUP with small right CLC FHR 96 Gc/Chlamdyia-pending Discussed with Dr. Renaldo Fiddler- pt to keep U/S appt next week as scheduled to confirm viability  Assessment and Plan  Bleeding in early pregnancy  LINEBERRY,SUSAN 04/15/2011, 8:39 AM

## 2011-04-15 NOTE — Progress Notes (Signed)
Has been followed at MD office to confirm preg.  Had been having pain in RLQ, had spotting last night, was sick all day (cold sick).  Generalized cramping today.

## 2011-04-23 ENCOUNTER — Inpatient Hospital Stay (HOSPITAL_COMMUNITY)
Admission: AD | Admit: 2011-04-23 | Discharge: 2011-04-23 | Disposition: A | Discharge status: Home / Self Care | Payer: BC Managed Care – PPO | Source: Ambulatory Visit | Attending: Obstetrics and Gynecology | Admitting: Obstetrics and Gynecology

## 2011-04-23 ENCOUNTER — Encounter (HOSPITAL_COMMUNITY): Payer: Self-pay | Admitting: *Deleted

## 2011-04-23 DIAGNOSIS — O209 Hemorrhage in early pregnancy, unspecified: Secondary | ICD-10-CM

## 2011-04-23 HISTORY — DX: Bipolar disorder, unspecified: F31.9

## 2011-04-23 NOTE — ED Provider Notes (Signed)
History     Chief Complaint  Patient presents with  . Vaginal Bleeding   HPI This is a 21 y.o. female at [redacted]w[redacted]d who presents with c/o bleeding with clots over the past 24 hrs. Has had bleeding for weeks. This was dark and brown withsome red. Had u/s last week with "slower than expected" FHR of 95, but states she had one in the office yesterday with FHR 140s.   States has to pick up her son and cannot stay for [place] today.  Did have IC yesterday  OB History    Grav Para Term Preterm Abortions TAB SAB Ect Mult Living   3 1 1  0 1 0 1 0 0 1      Past Medical History  Diagnosis Date  . Orthostatic hypotension   . Bradycardia   . Asthma   . Urinary tract infection   . Depression   . Ovarian cyst   . PID (pelvic inflammatory disease)   . Abnormal Pap smear   . Chlamydia   . Bipolar 1 disorder     Past Surgical History  Procedure Date  . Dilation and curettage of uterus     Family History  Problem Relation Age of Onset  . Adopted: Yes    History  Substance Use Topics  . Smoking status: Former Smoker    Quit date: 03/29/2011  . Smokeless tobacco: Never Used   Comment: with positive preg test  . Alcohol Use: No    Allergies: No Known Allergies  Prescriptions prior to admission  Medication Sig Dispense Refill  . acetaminophen (TYLENOL) 325 MG tablet Take 325 mg by mouth every 6 (six) hours as needed. For pain and fever.      . Prenatal Vit-Fe Fumarate-FA (PRENATAL MULTIVITAMIN) TABS Take 1 tablet by mouth daily.        ROS As above  Physical Exam   Blood pressure 103/48, pulse 75, temperature 98.8 F (37.1 C), temperature source Oral, resp. rate 16, height 5\' 4"  (1.626 m), weight 176 lb 9.6 oz (80.105 kg), last menstrual period 03/08/2011, SpO2 97.00%, unknown if currently breastfeeding.  Physical Exam Deferred due to pt's request  Does not want exam.   I did a bedside US and noted her abdomen to be soft and nontender. US showed  SIUP with FHR seen at 150-160.    MAU Course  Procedures  Assessment and Plan  A:  SIUP at [redacted]w[redacted]d      First Trimester bleeding      Reassuring FHR P:  Reassured.      Bleeding precautions      Follow up as scheduled next week in office      Recommend pelvic rest until she has 1-2 weeks with no bleeding  [facility] 04/23/2011, 5:00 PM

## 2011-04-23 NOTE — Progress Notes (Signed)
Onset of vaginal bleeding with passing clots mild back pain had [place] yesterday at [facility]  [facility] 9/17

## 2011-04-23 NOTE — ED Notes (Signed)
M.Williams at bedside with U/S machine. FHR observed with u/s. Pt reassured and stated she needed to pick up son could not wait for formal u/s. Pt d/c home.

## 2011-07-12 ENCOUNTER — Encounter (HOSPITAL_COMMUNITY): Payer: Self-pay | Admitting: *Deleted

## 2011-07-12 ENCOUNTER — Inpatient Hospital Stay (HOSPITAL_COMMUNITY)
Admission: AD | Admit: 2011-07-12 | Discharge: 2011-07-12 | Disposition: A | Discharge status: Home / Self Care | Payer: BC Managed Care – PPO | Source: Ambulatory Visit | Attending: Obstetrics and Gynecology | Admitting: Obstetrics and Gynecology

## 2011-07-12 DIAGNOSIS — R1011 Right upper quadrant pain: Secondary | ICD-10-CM | POA: Insufficient documentation

## 2011-07-12 LAB — COMPREHENSIVE METABOLIC PANEL
ALT: 10 U/L (ref 0–35)
AST: 14 U/L (ref 0–37)
Albumin: 2.4 g/dL — ABNORMAL LOW (ref 3.5–5.2)
Alkaline Phosphatase: 52 U/L (ref 39–117)
BUN: 10 mg/dL (ref 6–23)
Chloride: 101 mEq/L (ref 96–112)
Potassium: 3.9 mEq/L (ref 3.5–5.1)
Total Bilirubin: 0.2 mg/dL — ABNORMAL LOW (ref 0.3–1.2)

## 2011-07-12 LAB — URINALYSIS, ROUTINE W REFLEX MICROSCOPIC
Glucose, UA: NEGATIVE mg/dL
Leukocytes, UA: NEGATIVE
Protein, ur: NEGATIVE mg/dL

## 2011-07-12 LAB — CBC
HCT: 33.3 % — ABNORMAL LOW (ref 36.0–46.0)
Platelets: 234 10*3/uL (ref 150–400)
RDW: 13.3 % (ref 11.5–15.5)
WBC: 10.8 10*3/uL — ABNORMAL HIGH (ref 4.0–10.5)

## 2011-07-12 NOTE — Discharge Instructions (Signed)
Abdominal Pain During Pregnancy  Abdominal discomfort is common in pregnancy. Most of the time, it does not cause harm. There are many causes of abdominal pain. Some causes are more serious than others. Some of the causes of abdominal pain in pregnancy are easily diagnosed. Occasionally, the diagnosis takes time to understand. Other times, the cause is not determined. Abdominal pain can be a sign that something is very wrong with the pregnancy, or the pain may have nothing to do with the pregnancy at all. For this reason, always tell your caregiver if you have any abdominal discomfort.  CAUSES  Common and harmless causes of abdominal pain include:   Constipation.   Excess gas and bloating.   Round ligament pain. This is pain that is felt in the folds of the groin.   The position the baby or placenta is in.   Baby kicks.   Braxton-Hicks contractions. These are mild contractions that do not cause cervical dilation.  Serious causes of abdominal pain include:   Ectopic pregnancy. This happens when a fertilized egg implants outside of the uterus.   Miscarriage.   Preterm labor. This is when labor starts at less than 37 weeks of pregnancy.   Placental abruption. This is when the placenta partially or completely separates from the uterus.   Preeclampsia. This is often associated with high blood pressure and has been referred to as "toxemia in pregnancy."   Uterine or amniotic fluid infections.  Causes unrelated to pregnancy include:   Urinary tract infection.   Gallbladder stones or inflammation.   Hepatitis or other liver illness.   Intestinal problems, stomach flu, food poisoning, or ulcer.   Appendicitis.   Kidney (renal) stones.   Kidney infection (pylonephritis).  HOME CARE INSTRUCTIONS   For mild pain:   Do not have sexual intercourse or put anything in your vagina until your symptoms go away completely.   Get plenty of rest until your pain improves. If your pain does not improve in 1 hour, call  your caregiver.   Drink clear fluids if you feel nauseous. Avoid solid food as long as you are uncomfortable or nauseous.   Only take medicine as directed by your caregiver.   Keep all follow-up appointments with your caregiver.  SEEK IMMEDIATE MEDICAL CARE IF:   You are bleeding, leaking fluid, or passing tissue from the vagina.   You have increasing pain or cramping.   You have persistent vomiting.   You have painful or bloody urination.   You have a fever.   You notice a decrease in your baby's movements.   You have extreme weakness or feel faint.   You have shortness of breath, with or without abdominal pain.   You develop a severe headache with abdominal pain.   You have abnormal vaginal discharge with abdominal pain.   You have persistent diarrhea.   You have abdominal pain that continues even after rest, or gets worse.  MAKE SURE YOU:    Understand these instructions.   Will watch your condition.   Will get help right away if you are not doing well or get worse.  Document Released: 03/14/2005 Document Revised: 03/03/2011 Document Reviewed: 10/08/2010  [employer] Patient Information 2012 [employer].

## 2011-07-12 NOTE — MAU Note (Signed)
Pt in c/o sharp, twisting pains in ruq that started this morning.  Feels relief when pressing on it.  Denies any vaginal problems.

## 2011-07-12 NOTE — MAU Provider Note (Signed)
History   Pt presents today c/o RUQ pain for the past couple of hours. She states the pain is constant and feel better when she pushes on her abd. She also reports some lower abd cramping that comes and goes but none now. She denies vag dc, bleeding, fever, dysuria, N&V, or any other sx at this time.  CSN: [id number]  Arrival date and time: 07/12/11 4098   First Provider Initiated Contact with Patient 07/12/11 [phone]      Chief Complaint  Patient presents with  . Abdominal Pain   HPI  OB History    Grav Para Term Preterm Abortions TAB SAB Ect Mult Living   3 1 1  0 1 0 1 0 0 1      Past Medical History  Diagnosis Date  . Orthostatic hypotension   . Bradycardia   . Asthma   . Urinary tract infection   . Depression   . Ovarian cyst   . PID (pelvic inflammatory disease)   . Abnormal Pap smear   . Chlamydia   . Bipolar 1 disorder     Past Surgical History  Procedure Date  . Dilation and curettage of uterus     Family History  Problem Relation Age of Onset  . Adopted: Yes  . Anesthesia problems Neg Hx     History  Substance Use Topics  . Smoking status: Former Smoker    Quit date: 03/29/2011  . Smokeless tobacco: Never Used   Comment: with positive preg test  . Alcohol Use: No    Allergies: No Known Allergies  Prescriptions prior to admission  Medication Sig Dispense Refill  . Prenatal Vit-Fe Fumarate-FA (PRENATAL MULTIVITAMIN) TABS Take 1 tablet by mouth daily.        Review of Systems  Constitutional: Negative for fever and chills.  Eyes: Negative for blurred vision and double vision.  Respiratory: Negative for cough, hemoptysis, sputum production, shortness of breath and wheezing.   Cardiovascular: Negative for chest pain and palpitations.  Gastrointestinal: Positive for abdominal pain. Negative for nausea, vomiting, diarrhea and constipation.  Genitourinary: Negative for dysuria, urgency, frequency and hematuria.  Neurological: Negative for dizziness  and headaches.  Psychiatric/Behavioral: Negative for depression and suicidal ideas.   Physical Exam   Blood pressure 115/59, pulse 82, temperature 98.5 F (36.9 C), temperature source Oral, resp. rate 18, height 5\' 5"  (1.651 m), weight 180 lb (81.647 kg), last menstrual period 03/08/2011, unknown if currently breastfeeding.  Physical Exam  Nursing note and vitals reviewed. Constitutional: She is oriented to person, place, and time. She appears well-developed and well-nourished. No distress.  HENT:  Head: Normocephalic and atraumatic.  Eyes: Conjunctivae and EOM are normal. Pupils are equal, round, and reactive to light.  GI: Soft. She exhibits no distension and no mass. There is tenderness (Slight tenderness to the RUQ and periumbilically. ). There is no rebound and no guarding.  Genitourinary: No bleeding around the vagina. No vaginal discharge found.       Cervix Lg/closed.  Neurological: She is alert and oriented to person, place, and time.  Skin: Skin is warm and dry. She is not diaphoretic.  Psychiatric: She has a normal mood and affect. Her behavior is normal. Judgment and thought content normal.    MAU Course  Procedures  Results for orders placed during the hospital encounter of 07/12/11 (from the past 24 hour(s))  URINALYSIS, ROUTINE W REFLEX MICROSCOPIC     Status: Normal   Collection Time   07/12/11  9:20  AM      Component Value Range   Color, Urine YELLOW  YELLOW    APPearance CLEAR  CLEAR    Specific Gravity, Urine 1.015  1.005 - 1.030    pH 6.0  5.0 - 8.0    Glucose, UA NEGATIVE  NEGATIVE (mg/dL)   Hgb urine dipstick NEGATIVE  NEGATIVE    Bilirubin Urine NEGATIVE  NEGATIVE    Ketones, ur NEGATIVE  NEGATIVE (mg/dL)   Protein, ur NEGATIVE  NEGATIVE (mg/dL)   Urobilinogen, UA 0.2  0.0 - 1.0 (mg/dL)   Nitrite NEGATIVE  NEGATIVE    Leukocytes, UA NEGATIVE  NEGATIVE   CBC     Status: Abnormal   Collection Time   07/12/11  9:54 AM      Component Value Range   WBC  10.8 (*) 4.0 - 10.5 (K/uL)   RBC 3.87  3.87 - 5.11 (MIL/uL)   Hemoglobin 10.6 (*) 12.0 - 15.0 (g/dL)   HCT 04.5 (*) 40.9 - 46.0 (%)   MCV 86.0  78.0 - 100.0 (fL)   MCH 27.4  26.0 - 34.0 (pg)   MCHC 31.8  30.0 - 36.0 (g/dL)   RDW 81.1  91.4 - 78.2 (%)   Platelets 234  150 - 400 (K/uL)  COMPREHENSIVE METABOLIC PANEL     Status: Abnormal   Collection Time   07/12/11  9:54 AM      Component Value Range   Sodium 135  135 - 145 (mEq/L)   Potassium 3.9  3.5 - 5.1 (mEq/L)   Chloride 101  96 - 112 (mEq/L)   CO2 27  19 - 32 (mEq/L)   Glucose, Bld 74  70 - 99 (mg/dL)   BUN 10  6 - 23 (mg/dL)   Creatinine, Ser 9.56  0.50 - 1.10 (mg/dL)   Calcium 8.9  8.4 - 21.3 (mg/dL)   Total Protein 5.4 (*) 6.0 - 8.3 (g/dL)   Albumin 2.4 (*) 3.5 - 5.2 (g/dL)   AST 14  0 - 37 (U/L)   ALT 10  0 - 35 (U/L)   Alkaline Phosphatase 52  39 - 117 (U/L)   Total Bilirubin 0.2 (*) 0.3 - 1.2 (mg/dL)   GFR calc non Af Amer >90  >90 (mL/min)   GFR calc Af Amer >90  >90 (mL/min)   Discussed pt with Dr. Renaldo Fiddler. Ok to dc to home.  Assessment and Plan  RUQ pain: discussed with pt at length. She has a f/u appt scheduled with Dr. Kinnie Scales office. Discussed diet, activity, risks, and precautions.  Clinton Gallant. Charletta Voight III, DrHSc, MPAS, PA-C  07/12/2011, 10:05 AM

## 2011-08-05 ENCOUNTER — Inpatient Hospital Stay (HOSPITAL_COMMUNITY)
Admission: AD | Admit: 2011-08-05 | Discharge: 2011-08-05 | Disposition: A | Discharge status: Home / Self Care | Payer: BC Managed Care – PPO | Source: Ambulatory Visit | Attending: Obstetrics and Gynecology | Admitting: Obstetrics and Gynecology

## 2011-08-05 ENCOUNTER — Encounter (HOSPITAL_COMMUNITY): Payer: Self-pay

## 2011-08-05 DIAGNOSIS — R109 Unspecified abdominal pain: Secondary | ICD-10-CM | POA: Insufficient documentation

## 2011-08-05 DIAGNOSIS — O26899 Other specified pregnancy related conditions, unspecified trimester: Secondary | ICD-10-CM

## 2011-08-05 DIAGNOSIS — K5900 Constipation, unspecified: Secondary | ICD-10-CM | POA: Insufficient documentation

## 2011-08-05 DIAGNOSIS — O368190 Decreased fetal movements, unspecified trimester, not applicable or unspecified: Secondary | ICD-10-CM | POA: Insufficient documentation

## 2011-08-05 LAB — URINALYSIS, ROUTINE W REFLEX MICROSCOPIC
Bilirubin Urine: NEGATIVE
Ketones, ur: NEGATIVE mg/dL
Nitrite: NEGATIVE
pH: 6 (ref 5.0–8.0)

## 2011-08-05 LAB — URINE MICROSCOPIC-ADD ON
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
## 2011-08-05 NOTE — Discharge Instructions (Signed)
Drink at least 8 8-oz glasses of water every day. Take Tylenol 325 mg 2 tablets by mouth every 4 hours if needed for pain. Keep your appointments in the office Eat a high fiber diet. Call your doctor if you are having problems.

## 2011-09-23 ENCOUNTER — Inpatient Hospital Stay (HOSPITAL_COMMUNITY)
Admission: AD | Admit: 2011-09-23 | Discharge: 2011-09-23 | Disposition: A | Discharge status: Home / Self Care | Payer: BC Managed Care – PPO | Source: Ambulatory Visit | Attending: Obstetrics and Gynecology | Admitting: Obstetrics and Gynecology

## 2011-09-23 ENCOUNTER — Encounter (HOSPITAL_COMMUNITY): Payer: Self-pay | Admitting: *Deleted

## 2011-09-23 DIAGNOSIS — R55 Syncope and collapse: Secondary | ICD-10-CM

## 2011-09-23 DIAGNOSIS — R109 Unspecified abdominal pain: Secondary | ICD-10-CM | POA: Insufficient documentation

## 2011-09-23 DIAGNOSIS — O2650 Maternal hypotension syndrome, unspecified trimester: Secondary | ICD-10-CM | POA: Insufficient documentation

## 2011-09-23 LAB — CBC
MCV: 84.9 fL (ref 78.0–100.0)
Platelets: 269 10*3/uL (ref 150–400)
RBC: 3.83 MIL/uL — ABNORMAL LOW (ref 3.87–5.11)
WBC: 11.6 10*3/uL — ABNORMAL HIGH (ref 4.0–10.5)

## 2011-09-23 LAB — GLUCOSE, CAPILLARY: Glucose-Capillary: 76 mg/dL (ref 70–99)

## 2011-09-23 NOTE — MAU Provider Note (Signed)
Chief Complaint:  Blurred Vision and Abdominal Pain   First Provider Initiated Contact with Patient 09/23/11 1113      HPI  Kathy [name] is a 21 y.o. G3P1011 at [redacted]w[redacted]d presenting with presyncope sx. 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 No longer feels weak but does have a headache. Declines treatment for that. This episode was similar to orthostatic hypotension she experienced postdelivery in the past. Has never fainted. She also reports bilateral low back pain that has been present four-week or so and radiates around to the left groin. She's also had intermittent visual blurring in her left eye for about 2 weeks. She denies dysuria, urinary urgency or frequency, hematuria. Denies contractions, leakage of fluid or vaginal bleeding. Good fetal movement.   Pregnancy Course: uncomplicated  Past Medical History: Past Medical History  Diagnosis Date  . Orthostatic hypotension   . Bradycardia   . Asthma   . Urinary tract infection   . Depression   . Ovarian cyst   . PID (pelvic inflammatory disease)   . Abnormal Pap smear   . Chlamydia   . Bipolar 1 disorder     Past Surgical History: Past Surgical History  Procedure Date  . Dilation and curettage of uterus     Family History: Family History  Problem Relation Age of Onset  . Adopted: Yes  . Anesthesia problems Neg Hx     Social History: History  Substance Use Topics  . Smoking status: Former Smoker    Quit date: 03/29/2011  . Smokeless tobacco: Never Used   Comment: with positive preg test  . Alcohol Use: No    Allergies: No Known Allergies  Meds:  Prescriptions prior to admission  Medication Sig Dispense Refill  . Prenatal Vit-Fe Fumarate-FA (PRENATAL MULTIVITAMIN) TABS Take 1 tablet by mouth daily.          Physical Exam  Blood pressure 113/60, pulse 94, temperature 99.1 F (37.3 C), temperature source  Oral, resp. rate 16, height 5\' 5"  (1.651 m), weight 98.884 kg (218 lb), last menstrual period 03/08/2011, SpO2 99.00%, unknown if currently breastfeeding.  Orthostatic VS normal  GENERAL: Well-developed, well-nourished female in no acute distress.  HEENT: normocephalic, good dentition HEART: normal rate RESP: normal effort ABDOMEN: Soft, nontender, gravid appropriate for gestational age EXTREMITIES: Nontender, no edema NEURO: alert and oriented      FHT:  Baseline 150 , moderate variability, accelerations present, no decelerations Contractions: none   Labs: Results for orders placed during the hospital encounter of 09/23/11 (from the past 24 hour(s))  GLUCOSE, CAPILLARY     Status: Normal   Collection Time   09/23/11 11:36 AM      Component Value Range   Glucose-Capillary 76  70 - 99 mg/dL   Comment 1 Notify RN    CBC     Status: Abnormal   Collection Time   09/23/11 11:52 AM      Component Value Range   WBC 11.6 (*) 4.0 - 10.5 K/uL   RBC 3.83 (*) 3.87 - 5.11 MIL/uL   Hemoglobin 10.5 (*) 12.0 - 15.0 g/dL   HCT 16.1 (*) 09.6 - 04.5 %   MCV 84.9  78.0 - 100.0 fL   MCH 27.4  26.0 - 34.0 pg   MCHC 32.3  30.0 - 36.0 g/dL   RDW 40.9  81.1 - 91.4 %   Platelets  269  150 - 400 K/uL        Assessment:  G3P1011 at [redacted]w[redacted]d Presyncope   Plan: Discharge with precautions on slow position changes, stay well-hydrated. Followup at routine prenatal visit next week as scheduled    Kestrel Mis 6/28/201311:29 AM

## 2011-09-23 NOTE — Discharge Instructions (Signed)
Near-Syncope Near-syncope is sudden weakness, dizziness, or feeling like you might pass out (faint). This may occur when getting up after sitting or while standing for a long period of time. Near-syncope can be caused by a drop in blood pressure. This is a common reaction, but it may occur to a greater degree in people taking medicines to control their blood pressure. Fainting often occurs when the blood pressure or pulse is too low to provide enough blood flow to the brain to keep you conscious. Fainting and near-syncope are not usually due to serious medical problems. However, certain people should be more cautious in the event of near-syncope, including elderly patients, patients with diabetes, and patients with a history of heart conditions (especially irregular rhythms).  CAUSES   Drop in blood pressure.   Physical pain.   Dehydration.   Heat exhaustion.   Emotional distress.   Low blood sugar.   Internal bleeding.   Heart and circulatory problems.   Infections.  SYMPTOMS   Dizziness.   Feeling sick to your stomach (nauseous).   Nearly fainting.   Body numbness.   Turning pale.   Tunnel vision.   Weakness.  HOME CARE INSTRUCTIONS   Lie down right away if you start feeling like you might faint. Breathe deeply and steadily. Wait until all the symptoms have passed. Most of these episodes last only a few minutes. You may feel tired for several hours.   Drink enough fluids to keep your urine clear or pale yellow.   If you are taking blood pressure or heart medicine, get up slowly, taking several minutes to sit and then stand. This can reduce dizziness that is caused by a drop in blood pressure.  SEEK IMMEDIATE MEDICAL CARE IF:   You have a severe headache.   Unusual pain develops in the chest, abdomen, or back.   There is bleeding from the mouth or rectum, or you have black or tarry stool.   An irregular heartbeat or a very rapid pulse develops.   You have  repeated fainting or seizure-like jerking during an episode.   You faint when sitting or lying down.   You develop confusion.   You have difficulty walking.   Severe weakness develops.   Vision problems develop.  MAKE SURE YOU:   Understand these instructions.   Will watch your condition.   Will get help right away if you are not doing well or get worse.  Document Released: 03/14/2005 Document Revised: 03/03/2011 Document Reviewed: 04/30/2010 [employer] Patient Information 2012 [employer]. 

## 2011-09-23 NOTE — MAU Note (Signed)
Patient states she has been having blurred vision in the right eye off and on for about 1-2 weeks. Started having abdominal pain for a couple of days. Today having back pain, feels tired an weak,  felt dizzy and like she was going to faint. Denies any bleeding or leaking and reports good fetal movement.

## 2011-10-17 ENCOUNTER — Encounter (HOSPITAL_COMMUNITY): Payer: Self-pay | Admitting: *Deleted

## 2011-10-17 ENCOUNTER — Inpatient Hospital Stay (HOSPITAL_COMMUNITY)
Admission: AD | Admit: 2011-10-17 | Discharge: 2011-10-18 | Disposition: A | Discharge status: Home / Self Care | Payer: BC Managed Care – PPO | Source: Ambulatory Visit | Attending: Obstetrics and Gynecology | Admitting: Obstetrics and Gynecology

## 2011-10-17 DIAGNOSIS — M545 Low back pain, unspecified: Secondary | ICD-10-CM | POA: Insufficient documentation

## 2011-10-17 DIAGNOSIS — A084 Viral intestinal infection, unspecified: Secondary | ICD-10-CM | POA: Diagnosis present

## 2011-10-17 DIAGNOSIS — O99891 Other specified diseases and conditions complicating pregnancy: Secondary | ICD-10-CM | POA: Insufficient documentation

## 2011-10-17 DIAGNOSIS — R197 Diarrhea, unspecified: Secondary | ICD-10-CM | POA: Insufficient documentation

## 2011-10-17 DIAGNOSIS — A088 Other specified intestinal infections: Secondary | ICD-10-CM | POA: Insufficient documentation

## 2011-10-17 DIAGNOSIS — R109 Unspecified abdominal pain: Secondary | ICD-10-CM | POA: Insufficient documentation

## 2011-10-17 LAB — URINALYSIS, ROUTINE W REFLEX MICROSCOPIC
Leukocytes, UA: NEGATIVE
Nitrite: NEGATIVE
Protein, ur: NEGATIVE mg/dL
Urobilinogen, UA: 0.2 mg/dL (ref 0.0–1.0)

## 2011-10-17 MED ORDER — DEXTROSE 5 % IN LACTATED RINGERS IV BOLUS: 1000.0000 mL | Freq: Once | INTRAVENOUS | Status: DC

## 2011-10-17 MED ORDER — ONDANSETRON HCL 4 MG/2ML IJ SOLN
4.0000 mg | INTRAMUSCULAR | Status: AC
  Administered 2011-10-18: 4 mg via INTRAVENOUS
  Filled 2011-10-17: qty 2

## 2011-10-17 NOTE — MAU Note (Signed)
Pt reports pain on left side of abd and upper abd.

## 2011-10-17 NOTE — Progress Notes (Signed)
Pt states she was treated for depression about 2 years ago 

## 2011-10-17 NOTE — Progress Notes (Signed)
Pt states she had nausea and blurred vision and she feel dizzy

## 2011-10-17 NOTE — MAU Provider Note (Signed)
Chief Complaint:  Abdominal Pain   First Provider Initiated Contact with Patient 10/17/11 2232      HPI  Kathy [name] is a 21 y.o. G3P1011 at [redacted]w[redacted]d presenting with diarrhea x2-3 times in 3 hours, abdominal cramping, and constant lower back pain.  She also reports nausea without vomiting with onset around 5 pm, the same time diarrhea started.  She reports good fetal movement, denies LOF, vaginal bleeding, vaginal itching/burning, urinary symptoms, h/a, dizziness, or fever/chills.     Pregnancy Course: uncomplicated  Past Medical History: Past Medical History  Diagnosis Date  . Orthostatic hypotension   . Bradycardia   . Asthma   . Urinary tract infection   . Depression   . Ovarian cyst   . PID (pelvic inflammatory disease)   . Abnormal Pap smear   . Chlamydia   . Bipolar 1 disorder     Past Surgical History: Past Surgical History  Procedure Date  . Dilation and curettage of uterus     Family History: Family History  Problem Relation Age of Onset  . Adopted: Yes  . Anesthesia problems Neg Hx     Social History: History  Substance Use Topics  . Smoking status: Former Smoker    Quit date: 03/29/2011  . Smokeless tobacco: Never Used   Comment: with positive preg test  . Alcohol Use: No    Allergies: No Known Allergies  Meds:  Prescriptions prior to admission  Medication Sig Dispense Refill  . Prenatal Vit-Fe Fumarate-FA (PRENATAL MULTIVITAMIN) TABS Take 1 tablet by mouth daily.          Physical Exam  Blood pressure 127/69, pulse 114, temperature 98.7 F (37.1 C), temperature source Oral, resp. rate 20, height 5' 3.5" (1.613 m), weight 102.967 kg (227 lb), last menstrual period 03/08/2011, SpO2 100.00%. GENERAL: Well-developed, well-nourished female in no acute distress.  HEENT: normocephalic, good dentition HEART: normal rate RESP: normal effort ABDOMEN: Soft, nontender, gravid appropriate for gestational age EXTREMITIES: Nontender, no  edema NEURO: alert and oriented  Pelvic exam: Cervix pink, visually closed, without lesion, scant white creamy discharge, vaginal walls and external genitalia normal Cervix 0/long/high, soft, posterior, external os 1 cm     FHT:  Baseline 165 , moderate variability, accelerations present, no decelerations Contractions: irregular, q 3-10 mins   Labs: Results for orders placed during the hospital encounter of 10/17/11 (from the past 24 hour(s))  URINALYSIS, ROUTINE W REFLEX MICROSCOPIC     Status: Abnormal   Collection Time   10/17/11  9:30 PM      Component Value Range   Color, Urine YELLOW  YELLOW   APPearance CLEAR  CLEAR   Specific Gravity, Urine 1.025  1.005 - 1.030   pH 6.0  5.0 - 8.0   Glucose, UA NEGATIVE  NEGATIVE mg/dL   Hgb urine dipstick NEGATIVE  NEGATIVE   Bilirubin Urine NEGATIVE  NEGATIVE   Ketones, ur >80 (*) NEGATIVE mg/dL   Protein, ur NEGATIVE  NEGATIVE mg/dL   Urobilinogen, UA 0.2  0.0 - 1.0 mg/dL   Nitrite NEGATIVE  NEGATIVE   Leukocytes, UA NEGATIVE  NEGATIVE     Assessment: Gastroenteritis, viral   Plan: LR x1000 ml by IV Zofran 4 mg IV x1 dose Called Dr Henderson Cloud to discuss assessment and findings Zofran 4 mg PO Q6 hours D/C home with preterm labor precautions F/U with prenatal provider Return to MAU as needed  Medication List  As of 10/20/2011  4:29 PM   TAKE these  medications         ondansetron 4 MG tablet   Commonly known as: ZOFRAN   Take 1 tablet (4 mg total) by mouth every 8 (eight) hours as needed for nausea.      prenatal multivitamin Tabs   Take 1 tablet by mouth daily.           LEFTWICH-KIRBY, LISA 7/22/201311:18 PM

## 2011-10-18 DIAGNOSIS — A088 Other specified intestinal infections: Secondary | ICD-10-CM

## 2011-10-18 DIAGNOSIS — A084 Viral intestinal infection, unspecified: Secondary | ICD-10-CM | POA: Diagnosis present

## 2011-10-18 MED ORDER — ONDANSETRON HCL 4 MG PO TABS: 4.0000 mg | ORAL_TABLET | Freq: Three times a day (TID) | ORAL | Status: AC | PRN

## 2011-11-25 ENCOUNTER — Inpatient Hospital Stay (HOSPITAL_COMMUNITY)
Admission: AD | Admit: 2011-11-25 | Discharge: 2011-11-25 | Disposition: A | Discharge status: Home / Self Care | Payer: BC Managed Care – PPO | Source: Ambulatory Visit | Attending: Obstetrics and Gynecology | Admitting: Obstetrics and Gynecology

## 2011-11-25 ENCOUNTER — Encounter (HOSPITAL_COMMUNITY): Payer: Self-pay | Admitting: *Deleted

## 2011-11-25 ENCOUNTER — Encounter (HOSPITAL_COMMUNITY): Payer: Self-pay

## 2011-11-25 ENCOUNTER — Inpatient Hospital Stay (HOSPITAL_COMMUNITY): Payer: BC Managed Care – PPO

## 2011-11-25 DIAGNOSIS — O479 False labor, unspecified: Secondary | ICD-10-CM | POA: Insufficient documentation

## 2011-11-25 DIAGNOSIS — O99891 Other specified diseases and conditions complicating pregnancy: Secondary | ICD-10-CM | POA: Insufficient documentation

## 2011-11-25 LAB — POCT FERN TEST: Fern Test: NEGATIVE

## 2011-11-25 NOTE — MAU Note (Signed)
Pt states she has been having leakage since a week ago and is concerned that she may be leaking amniotic fluid since last pregnancy she had the same issues with last pregnancy and was actually ruptured for a week or so.

## 2011-11-25 NOTE — MAU Note (Signed)
Pt notified u/s should be completed in the next half hour.

## 2011-11-25 NOTE — MAU Note (Signed)
Report given to Dr. Vincente Poli received order to discharge home

## 2011-11-25 NOTE — MAU Provider Note (Signed)
Kathy [name] is a 21 y.o. female @[redacted]w[redacted]d  gestation with ? ROM.  Procedures: Slide obtained for RN to do microscopic exam for ferning. No pooling noted on exam. RN  will call the patient's OB with results of slide and to give update on EFM tracing.   BP 117/75  Pulse 105  Temp 97.9 F (36.6 C) (Oral)  Resp 18  Ht 5' 5.5" (1.664 m)  Wt 247 lb 12.8 oz (112.401 kg)  BMI 40.61 kg/m2  LMP 03/08/2011

## 2011-11-25 NOTE — MAU Note (Signed)
RN notified pt that she is now waiting on u/s requested by Dr. Vincente Poli. Pt concerned with time, stating she has another child to take care of. RN to call u/s for estimated time until u/s performed.

## 2011-11-25 NOTE — MAU Note (Signed)
Patient states she is having contractions every 4-5 minutes, no leaking and reports good fetal movement. States she was seen in MAU earlier and r/o rom but states she was not examined. Spoke with her doctor and was told to come back for reevaluation.

## 2011-12-03 ENCOUNTER — Inpatient Hospital Stay (HOSPITAL_COMMUNITY)
Admission: AD | Admit: 2011-12-03 | Discharge: 2011-12-03 | Disposition: A | Discharge status: Home / Self Care | Payer: BC Managed Care – PPO | Source: Ambulatory Visit | Attending: Obstetrics and Gynecology | Admitting: Obstetrics and Gynecology

## 2011-12-03 ENCOUNTER — Encounter (HOSPITAL_COMMUNITY): Payer: Self-pay | Admitting: Obstetrics and Gynecology

## 2011-12-03 DIAGNOSIS — O479 False labor, unspecified: Secondary | ICD-10-CM | POA: Insufficient documentation

## 2011-12-03 NOTE — MAU Note (Signed)
"  I've been having UCs since 0730 this morning every 4-6 mins.  They have been more frequent and intense.  I've been having backaches that are pretty regular and started at the same time. No VB or LOF.  (+) FM, but slower today.  He was more active yesterday."

## 2011-12-03 NOTE — MAU Note (Signed)
Pt reports having ctx on and off since this morning. C/o constant back pain as well. Denies vag bleeding or leaking at this time. Reports good fetal movement.

## 2011-12-11 ENCOUNTER — Inpatient Hospital Stay (HOSPITAL_COMMUNITY): Payer: BC Managed Care – PPO | Admitting: Anesthesiology

## 2011-12-11 ENCOUNTER — Encounter (HOSPITAL_COMMUNITY): Payer: Self-pay | Admitting: Obstetrics and Gynecology

## 2011-12-11 ENCOUNTER — Inpatient Hospital Stay (HOSPITAL_COMMUNITY)
Admission: AD | Admit: 2011-12-11 | Discharge: 2011-12-13 | DRG: 373 | Disposition: A | Discharge status: Home / Self Care | Payer: BC Managed Care – PPO | Source: Ambulatory Visit | Attending: Obstetrics and Gynecology | Admitting: Obstetrics and Gynecology

## 2011-12-11 ENCOUNTER — Encounter (HOSPITAL_COMMUNITY): Payer: Self-pay | Admitting: Anesthesiology

## 2011-12-11 DIAGNOSIS — A084 Viral intestinal infection, unspecified: Secondary | ICD-10-CM

## 2011-12-11 LAB — CBC
HCT: 36 % (ref 36.0–46.0)
MCV: 82.6 fL (ref 78.0–100.0)
Platelets: 231 10*3/uL (ref 150–400)
RBC: 4.36 MIL/uL (ref 3.87–5.11)
WBC: 10.8 10*3/uL — ABNORMAL HIGH (ref 4.0–10.5)

## 2011-12-11 MED ORDER — OXYTOCIN 40 UNITS IN LACTATED RINGERS INFUSION - SIMPLE MED
62.5000 mL/h | Freq: Once | INTRAVENOUS | Status: AC
  Administered 2011-12-12: 62.5 mL/h via INTRAVENOUS
  Filled 2011-12-11: qty 1000

## 2011-12-11 MED ORDER — TERBUTALINE SULFATE 1 MG/ML IJ SOLN: 0.2500 mg | Freq: Once | INTRAMUSCULAR | Status: AC | PRN

## 2011-12-11 MED ORDER — FENTANYL 2.5 MCG/ML BUPIVACAINE 1/10 % EPIDURAL INFUSION (WH - ANES)
14.0000 mL/h | INTRAMUSCULAR | Status: DC
  Administered 2011-12-11 (×2): 14 mL/h via EPIDURAL
  Filled 2011-12-11 (×2): qty 60

## 2011-12-11 MED ORDER — LIDOCAINE HCL (PF) 1 % IJ SOLN
INTRAMUSCULAR | Status: DC | PRN
  Administered 2011-12-11 (×2): 5 mL
  Administered 2011-12-12: 30 mL

## 2011-12-11 MED ORDER — LIDOCAINE HCL (PF) 1 % IJ SOLN
30.0000 mL | INTRAMUSCULAR | Status: DC | PRN
  Filled 2011-12-11: qty 30

## 2011-12-11 MED ORDER — OXYTOCIN BOLUS FROM INFUSION
500.0000 mL | Freq: Once | INTRAVENOUS | Status: AC
  Administered 2011-12-12: 500 mL via INTRAVENOUS
  Filled 2011-12-11: qty 500

## 2011-12-11 MED ORDER — EPHEDRINE 5 MG/ML INJ
10.0000 mg | INTRAVENOUS | Status: DC | PRN
  Filled 2011-12-11: qty 4

## 2011-12-11 MED ORDER — LACTATED RINGERS IV SOLN: 500.0000 mL | INTRAVENOUS | Status: DC | PRN

## 2011-12-11 MED ORDER — DIPHENHYDRAMINE HCL 50 MG/ML IJ SOLN: 12.5000 mg | INTRAMUSCULAR | Status: DC | PRN

## 2011-12-11 MED ORDER — ONDANSETRON HCL 4 MG/2ML IJ SOLN: 4.0000 mg | Freq: Four times a day (QID) | INTRAMUSCULAR | Status: DC | PRN

## 2011-12-11 MED ORDER — ACETAMINOPHEN 325 MG PO TABS: 650.0000 mg | ORAL_TABLET | ORAL | Status: DC | PRN

## 2011-12-11 MED ORDER — OXYCODONE-ACETAMINOPHEN 5-325 MG PO TABS: 1.0000 | ORAL_TABLET | ORAL | Status: DC | PRN

## 2011-12-11 MED ORDER — LACTATED RINGERS IV SOLN
INTRAVENOUS | Status: DC
  Administered 2011-12-11: 20:00:00 via INTRAVENOUS

## 2011-12-11 MED ORDER — PHENYLEPHRINE 40 MCG/ML (10ML) SYRINGE FOR IV PUSH (FOR BLOOD PRESSURE SUPPORT)
80.0000 ug | PREFILLED_SYRINGE | INTRAVENOUS | Status: DC | PRN
  Filled 2011-12-11: qty 5

## 2011-12-11 MED ORDER — EPHEDRINE 5 MG/ML INJ: 10.0000 mg | INTRAVENOUS | Status: DC | PRN

## 2011-12-11 MED ORDER — CITRIC ACID-SODIUM CITRATE 334-500 MG/5ML PO SOLN: 30.0000 mL | ORAL | Status: DC | PRN

## 2011-12-11 MED ORDER — LACTATED RINGERS IV SOLN
500.0000 mL | Freq: Once | INTRAVENOUS | Status: AC
  Administered 2011-12-11: 500 mL via INTRAVENOUS

## 2011-12-11 MED ORDER — PHENYLEPHRINE 40 MCG/ML (10ML) SYRINGE FOR IV PUSH (FOR BLOOD PRESSURE SUPPORT): 80.0000 ug | PREFILLED_SYRINGE | INTRAVENOUS | Status: DC | PRN

## 2011-12-11 MED ORDER — SODIUM CHLORIDE 0.9 % IV SOLN
2.0000 g | Freq: Once | INTRAVENOUS | Status: AC
  Administered 2011-12-11: 2 g via INTRAVENOUS
  Filled 2011-12-11: qty 2000

## 2011-12-11 MED ORDER — OXYTOCIN 40 UNITS IN LACTATED RINGERS INFUSION - SIMPLE MED: 1.0000 m[IU]/min | INTRAVENOUS | Status: DC

## 2011-12-11 MED ORDER — IBUPROFEN 600 MG PO TABS: 600.0000 mg | ORAL_TABLET | Freq: Four times a day (QID) | ORAL | Status: DC | PRN

## 2011-12-11 NOTE — Anesthesia Procedure Notes (Signed)
Epidural Patient location during procedure: OB Start time: 12/11/2011 8:40 PM  Staffing Anesthesiologist: Brayton Caves R Performed by: anesthesiologist   Preanesthetic Checklist Completed: patient identified, site marked, surgical consent, pre-op evaluation, timeout performed, IV checked, risks and benefits discussed and monitors and equipment checked  Epidural Patient position: sitting Prep: site prepped and draped and DuraPrep Patient monitoring: continuous pulse ox and blood pressure Approach: midline Injection technique: LOR air and LOR saline  Needle:  Needle type: Tuohy  Needle gauge: 17 G Needle length: 9 cm and 9 Needle insertion depth: 9 cm Catheter type: closed end flexible Catheter size: 19 Gauge Catheter at skin depth: 15 cm Test dose: negative  Assessment Events: blood not aspirated, injection not painful, no injection resistance, negative IV test and no paresthesia  Additional Notes Patient identified.  Risk benefits discussed including failed block, incomplete pain control, headache, nerve damage, paralysis, blood pressure changes, nausea, vomiting, reactions to medication both toxic or allergic, and postpartum back pain.  Patient expressed understanding and wished to proceed.  All questions were answered.  Sterile technique used throughout procedure and epidural site dressed with sterile barrier dressing. No paresthesia or other complications noted.The patient did not experience any signs of intravascular injection such as tinnitus or metallic taste in mouth nor signs of intrathecal spread such as rapid motor block. Please see nursing notes for vital signs.

## 2011-12-11 NOTE — H&P (Signed)
Kathy [name] is a 21 y.o. female presenting for C/O UCs. No recognized SROM. Had U/S with normal AFI, EFW about 8.5 lbs and negative Nitrazine/pool in office on Friday. Occasional mild HA. Maternal Medical History:  Reason for admission: Reason for admission: contractions.  Contractions: Onset was 3-5 hours ago.      OB History    Grav Para Term Preterm Abortions TAB SAB Ect Mult Living   3 1 1  0 1 0 1 0 0 1     Past Medical History  Diagnosis Date  . Orthostatic hypotension   . Bradycardia   . Asthma   . Urinary tract infection   . Depression   . Ovarian cyst   . PID (pelvic inflammatory disease)   . Abnormal Pap smear   . Chlamydia   . Bipolar 1 disorder    Past Surgical History  Procedure Date  . Dilation and curettage of uterus    Family History: family history is negative for Anesthesia problems.  She is adopted. Social History:  reports that she quit smoking about 8 months ago. Her smoking use included Cigarettes. She has never used smokeless tobacco. She reports that she does not drink alcohol or use illicit drugs.   Prenatal Transfer Tool  Maternal Diabetes: No Genetic Screening: Normal Maternal Ultrasounds/Referrals: Normal Fetal Ultrasounds or other Referrals:  None Maternal Substance Abuse:  No Significant Maternal Medications:  None Significant Maternal Lab Results:  None Other Comments:  Hx of bipolar disorder, currently no meds  Review of Systems  Gastrointestinal: Negative for abdominal pain.    Dilation: 5 Effacement (%): 90 Station: -1 Exam by:: Dr.Elek Holderness Blood pressure 118/62, pulse 78, temperature 97.5 F (36.4 C), temperature source Oral, resp. rate 18, height 5' 5.5" (1.664 m), weight 118.842 kg (262 lb), last menstrual period 03/08/2011. Maternal Exam:  Uterine Assessment: Contraction strength is moderate.  Contraction frequency is irregular.   Abdomen: Fetal presentation: vertex     Fetal Exam Fetal Monitor Review:  Pattern: accelerations present.       Physical Exam  Cardiovascular: Normal rate and regular rhythm.   Respiratory: Effort normal and breath sounds normal.  GI: There is no tenderness.  Neurological: She has normal reflexes.   AROM of forebag-clear fluid Prenatal labs: ABO, Rh:   Antibody:   Rubella:   RPR:    HBsAg:    HIV:    GBS: Negative (08/23 0000)   Assessment/Plan: 21 yo G3P1 at 41 5/7 weeks with +fern in MAU and C/O UCs.  Admit, pitocin, epidural prn SROM of unknown duration-will give antibiotics per GBBS protocol.   Kathy [name],Francys Bolin E 12/11/2011, 7:51 PM

## 2011-12-11 NOTE — Anesthesia Preprocedure Evaluation (Signed)
Anesthesia Evaluation  Patient identified by MRN, date of birth, ID band Patient awake    Reviewed: Allergy & Precautions, H&P , Patient's Chart, lab work & pertinent test results  Airway Mallampati: III TM Distance: >3 FB Neck ROM: full    Dental No notable dental hx.    Pulmonary neg pulmonary ROS, asthma ,  breath sounds clear to auscultation  Pulmonary exam normal       Cardiovascular negative cardio ROS  Rhythm:regular Rate:Normal     Neuro/Psych PSYCHIATRIC DISORDERS Bipolar Disorder negative neurological ROS  negative psych ROS   GI/Hepatic negative GI ROS, Neg liver ROS,   Endo/Other  negative endocrine ROSMorbid obesity  Renal/GU negative Renal ROS     Musculoskeletal   Abdominal   Peds  Hematology negative hematology ROS (+)   Anesthesia Other Findings Orthostatic hypotension     Bradycardia        Asthma     Urinary tract infection        Depression     Ovarian cyst        PID (pelvic inflammatory disease)     Abnormal Pap smear        Chlamydia     Bipolar 1 disorder    Reproductive/Obstetrics (+) Pregnancy                           Anesthesia Physical Anesthesia Plan  ASA: III  Anesthesia Plan: Epidural   Post-op Pain Management:    Induction:   Airway Management Planned:   Additional Equipment:   Intra-op Plan:   Post-operative Plan:   Informed Consent: I have reviewed the patients History and Physical, chart, labs and discussed the procedure including the risks, benefits and alternatives for the proposed anesthesia with the patient or authorized representative who has indicated his/her understanding and acceptance.     Plan Discussed with:   Anesthesia Plan Comments:         Anesthesia Quick Evaluation

## 2011-12-11 NOTE — Progress Notes (Signed)
Notified Dr. Henderson Cloud of patient arrival for labor evaluation. Pt is 39w5, large amounts of discharge noted prior to cervical exam, Crist Fat obtained, Fern positive. Orders received.

## 2011-12-11 NOTE — MAU Note (Signed)
Pt reports having contractions since 3 pm about 3-4 min apart. Reports having bloody show. Good fetal movement reported.

## 2011-12-12 ENCOUNTER — Encounter (HOSPITAL_COMMUNITY): Payer: Self-pay | Admitting: *Deleted

## 2011-12-12 LAB — CBC
HCT: 30.2 % — ABNORMAL LOW (ref 36.0–46.0)
Hemoglobin: 9.8 g/dL — ABNORMAL LOW (ref 12.0–15.0)
MCH: 26.9 pg (ref 26.0–34.0)
MCHC: 32.5 g/dL (ref 30.0–36.0)
RBC: 3.64 MIL/uL — ABNORMAL LOW (ref 3.87–5.11)

## 2011-12-12 MED ORDER — PRENATAL MULTIVITAMIN CH
1.0000 | ORAL_TABLET | Freq: Every day | ORAL | Status: DC
  Administered 2011-12-12 – 2011-12-13 (×2): 1 via ORAL
  Filled 2011-12-12 (×2): qty 1

## 2011-12-12 MED ORDER — BISACODYL 10 MG RE SUPP: 10.0000 mg | Freq: Every day | RECTAL | Status: DC | PRN

## 2011-12-12 MED ORDER — DIBUCAINE 1 % RE OINT: 1.0000 "application " | TOPICAL_OINTMENT | RECTAL | Status: DC | PRN

## 2011-12-12 MED ORDER — WITCH HAZEL-GLYCERIN EX PADS: 1.0000 "application " | MEDICATED_PAD | CUTANEOUS | Status: DC | PRN

## 2011-12-12 MED ORDER — SENNOSIDES-DOCUSATE SODIUM 8.6-50 MG PO TABS
2.0000 | ORAL_TABLET | Freq: Every day | ORAL | Status: DC
  Administered 2011-12-12: 2 via ORAL

## 2011-12-12 MED ORDER — ONDANSETRON HCL 4 MG/2ML IJ SOLN: 4.0000 mg | INTRAMUSCULAR | Status: DC | PRN

## 2011-12-12 MED ORDER — INFLUENZA VIRUS VACC SPLIT PF IM SUSP
0.5000 mL | Freq: Once | INTRAMUSCULAR | Status: AC
Start: 2011-12-12 — End: 2011-12-12
  Administered 2011-12-12: 0.5 mL via INTRAMUSCULAR
  Filled 2011-12-12: qty 0.5

## 2011-12-12 MED ORDER — BENZOCAINE-MENTHOL 20-0.5 % EX AERO
1.0000 "application " | INHALATION_SPRAY | CUTANEOUS | Status: DC | PRN
  Administered 2011-12-13: 1 via TOPICAL
  Filled 2011-12-12 (×2): qty 56

## 2011-12-12 MED ORDER — OXYCODONE-ACETAMINOPHEN 5-325 MG PO TABS
1.0000 | ORAL_TABLET | ORAL | Status: DC | PRN
Start: 2011-12-12 — End: 2011-12-13

## 2011-12-12 MED ORDER — ZOLPIDEM TARTRATE 5 MG PO TABS: 5.0000 mg | ORAL_TABLET | Freq: Every evening | ORAL | Status: DC | PRN

## 2011-12-12 MED ORDER — FLEET ENEMA 7-19 GM/118ML RE ENEM: 1.0000 | ENEMA | Freq: Every day | RECTAL | Status: DC | PRN

## 2011-12-12 MED ORDER — TETANUS-DIPHTH-ACELL PERTUSSIS 5-2.5-18.5 LF-MCG/0.5 IM SUSP: 0.5000 mL | Freq: Once | INTRAMUSCULAR | Status: DC

## 2011-12-12 MED ORDER — SERTRALINE HCL 50 MG PO TABS
50.0000 mg | ORAL_TABLET | Freq: Every day | ORAL | Status: DC
  Administered 2011-12-12 – 2011-12-13 (×2): 50 mg via ORAL
  Filled 2011-12-12 (×2): qty 1

## 2011-12-12 MED ORDER — DIPHENHYDRAMINE HCL 25 MG PO CAPS: 25.0000 mg | ORAL_CAPSULE | Freq: Four times a day (QID) | ORAL | Status: DC | PRN

## 2011-12-12 MED ORDER — IBUPROFEN 600 MG PO TABS
600.0000 mg | ORAL_TABLET | Freq: Four times a day (QID) | ORAL | Status: DC
  Administered 2011-12-12 – 2011-12-13 (×5): 600 mg via ORAL
  Filled 2011-12-12 (×5): qty 1

## 2011-12-12 MED ORDER — LANOLIN HYDROUS EX OINT: TOPICAL_OINTMENT | CUTANEOUS | Status: DC | PRN

## 2011-12-12 MED ORDER — PNEUMOCOCCAL VAC POLYVALENT 25 MCG/0.5ML IJ INJ
0.5000 mL | INJECTION | INTRAMUSCULAR | Status: AC
  Administered 2011-12-13: 0.5 mL via INTRAMUSCULAR
  Filled 2011-12-12: qty 0.5

## 2011-12-12 MED ORDER — SIMETHICONE 80 MG PO CHEW: 80.0000 mg | CHEWABLE_TABLET | ORAL | Status: DC | PRN

## 2011-12-12 MED ORDER — ONDANSETRON HCL 4 MG PO TABS: 4.0000 mg | ORAL_TABLET | ORAL | Status: DC | PRN

## 2011-12-12 NOTE — Progress Notes (Signed)
Delivery Note Pushing with good effort vtx posterior/asynclytic FHT 60-120s St cath done D/W patient and husband VE due to [facility] and no progress beyond +3 Second degree MLE Kiwi VE x 1 UC-no pop offs-mild traction Nuchal cord x 1 VMI apgars 9/9 Weight/art pH pending Placenta intact, 3 vessels Cx/vagina intact MLE repaired, rectum checked Patient and infant stable in LDR

## 2011-12-12 NOTE — Anesthesia Postprocedure Evaluation (Signed)
  Anesthesia Post-op Note  Patient: Kathy [name]  Procedure(s) Performed: * No surgery found *  Patient Location: Mother/Baby  Anesthesia Type: Epidural  Level of Consciousness: awake  Airway and Oxygen Therapy: Patient Spontanous Breathing  Post-op Pain: none  Post-op Assessment: Patient's Cardiovascular Status Stable, Respiratory Function Stable, Patent Airway, No signs of Nausea or vomiting, Adequate PO intake, Pain level controlled, No headache, No backache, No residual numbness and No residual motor weakness  Post-op Vital Signs: Reviewed and stable  Complications: No apparent anesthesia complications

## 2011-12-12 NOTE — Progress Notes (Signed)
Post Partum Day 0 Subjective: up ad lib, voiding, tolerating PO and H/O of depression and desires to start Zoloft  Objective: Blood pressure 94/56, pulse 79, temperature 97.9 F (36.6 C), temperature source Oral, resp. rate 18, height 5' 5.5" (1.664 m), weight 118.842 kg (262 lb), last menstrual period 03/08/2011, unknown if currently breastfeeding.  Physical Exam:  General: alert and cooperative Lochia: appropriate Uterine Fundus: firm Incision: perineum intact , no significant edema noted DVT Evaluation: No evidence of DVT seen on physical exam.   Basename 12/12/11 0528 12/11/11 1940  HGB 9.8* 11.6*  HCT 30.2* 36.0    Assessment/Plan: Plan for discharge tomorrow   LOS: 1 day   CURTIS,CAROL G 12/12/2011, 8:03 AM

## 2011-12-13 MED ORDER — IBUPROFEN 600 MG PO TABS: 600.0000 mg | ORAL_TABLET | Freq: Four times a day (QID) | ORAL | Status: DC

## 2011-12-13 MED ORDER — SERTRALINE HCL 50 MG PO TABS: 50.0000 mg | ORAL_TABLET | Freq: Every day | ORAL | Status: DC

## 2011-12-13 NOTE — Discharge Summary (Signed)
Obstetric Discharge Summary Reason for Admission: onset of labor Prenatal Procedures: ultrasound Intrapartum Procedures: vacuum Postpartum Procedures: none Complications-Operative and Postpartum: 2 degree perineal laceration Hemoglobin  Date Value Range Status  12/12/2011 9.8* 12.0 - 15.0 g/dL Final     HCT  Date Value Range Status  12/12/2011 30.2* 36.0 - 46.0 % Final    Physical Exam:  General: alert and cooperative Lochia: appropriate Uterine Fundus: firm Incision: perineum intact DVT Evaluation: No evidence of DVT seen on physical exam. No cords or calf tenderness.  Discharge Diagnoses: Term Pregnancy-delivered  Discharge Information: Date: 12/13/2011 Activity: pelvic rest Diet: routine Medications: PNV, Ibuprofen and zoloft Condition: stable Instructions: refer to practice specific booklet Discharge to: home   Newborn Data: Live born female  Birth Weight: 9 lb 2.9 oz (4165 g) APGAR: 9, 9  Home with mother.  CURTIS,CAROL G 12/13/2011, 8:10 AM

## 2011-12-13 NOTE — Clinical Social Work Psychosocial (Signed)
    Clinical Social Work Department BRIEF PSYCHOSOCIAL ASSESSMENT 12/13/2011  Patient:  Kathy [name], Kathy [name]     Account Number:  [id number]     Admit date:  12/11/2011  Clinical Social Worker:  Andy Gauss  Date/Time:  12/13/2011 03:42 PM  Referred by:  Physician  Date Referred:  12/12/2011 Referred for  Behavioral Health Issues   Other Referral:   Hx of depression and bipolar   Interview type:  Patient Other interview type:    PSYCHOSOCIAL DATA Living Status:  WITH MINOR CHILDREN Admitted from facility:   Level of care:   Primary support name:  Nigel Mormon Primary support relationship to patient:  FRIEND Degree of support available:   Involved    CURRENT CONCERNS Current Concerns  Behavioral Health Issues   Other Concerns:    SOCIAL WORK ASSESSMENT / PLAN Pt was diagnosed with depression and bipolar disorder at age 21.  Pt told Sw that she took Wellbutrin throughout high school in addition to Lamictal, as a Holiday representative to treat symptoms.  Once she graduated from school, she stopped taking the medication and was able to cope well.  When pt's father passed away, it was necessary for her to restart her medication.  She has continuously taking medication since she was diagnosed, with expectation of 1 year.  Pt reports feeling "decent & tired," now.  No depression.  SI history at age 21.  Pt's MD has provided pt with a script for Zoloft, as a precaution since pt is at higher risk of experiencing PP depression.  Pt agrees to take medication if needed.  She denies any history of PP depression.  She identified her mother, Avis Farruggia and FOB, as primary support system. Pt is employed at [employer].  She has all the necessary supplies for the infant.  FOB at the bedside and appears to be bonding well with the infant.  Pt appears to be self aware and appropriate.  Sw available to assist further if needed.   Assessment/plan status:  No Further Intervention Required Other  assessment/ plan:   Information/referral to community resources:   Pt has a prescription for an anti-depressant and agrees to start medication if symptoms arise.    PATIENT'S/FAMILY'S RESPONSE TO PLAN OF CARE: Pt thanked Sw for consult.

## 2012-01-24 ENCOUNTER — Emergency Department (HOSPITAL_COMMUNITY): Payer: BC Managed Care – PPO

## 2012-01-24 ENCOUNTER — Emergency Department (HOSPITAL_COMMUNITY)
Admission: EM | Admit: 2012-01-24 | Discharge: 2012-01-24 | Discharge status: Against Medical Advice | Payer: BC Managed Care – PPO | Attending: Emergency Medicine | Admitting: Emergency Medicine

## 2012-01-24 ENCOUNTER — Encounter (HOSPITAL_COMMUNITY): Payer: Self-pay

## 2012-01-24 ENCOUNTER — Emergency Department (HOSPITAL_COMMUNITY)
Admission: EM | Admit: 2012-01-24 | Discharge: 2012-01-24 | Disposition: A | Discharge status: Home / Self Care | Payer: BC Managed Care – PPO | Attending: Emergency Medicine | Admitting: Emergency Medicine

## 2012-01-24 ENCOUNTER — Encounter (HOSPITAL_COMMUNITY): Payer: Self-pay | Admitting: *Deleted

## 2012-01-24 DIAGNOSIS — S161XXA Strain of muscle, fascia and tendon at neck level, initial encounter: Secondary | ICD-10-CM

## 2012-01-24 DIAGNOSIS — W1809XA Striking against other object with subsequent fall, initial encounter: Secondary | ICD-10-CM | POA: Insufficient documentation

## 2012-01-24 DIAGNOSIS — Y929 Unspecified place or not applicable: Secondary | ICD-10-CM | POA: Insufficient documentation

## 2012-01-24 DIAGNOSIS — Y9389 Activity, other specified: Secondary | ICD-10-CM | POA: Insufficient documentation

## 2012-01-24 DIAGNOSIS — W108XXA Fall (on) (from) other stairs and steps, initial encounter: Secondary | ICD-10-CM | POA: Insufficient documentation

## 2012-01-24 DIAGNOSIS — Z8659 Personal history of other mental and behavioral disorders: Secondary | ICD-10-CM | POA: Insufficient documentation

## 2012-01-24 DIAGNOSIS — S139XXA Sprain of joints and ligaments of unspecified parts of neck, initial encounter: Secondary | ICD-10-CM | POA: Insufficient documentation

## 2012-01-24 DIAGNOSIS — Y9289 Other specified places as the place of occurrence of the external cause: Secondary | ICD-10-CM | POA: Insufficient documentation

## 2012-01-24 DIAGNOSIS — Z8742 Personal history of other diseases of the female genital tract: Secondary | ICD-10-CM | POA: Insufficient documentation

## 2012-01-24 DIAGNOSIS — I498 Other specified cardiac arrhythmias: Secondary | ICD-10-CM | POA: Insufficient documentation

## 2012-01-24 DIAGNOSIS — Z79899 Other long term (current) drug therapy: Secondary | ICD-10-CM | POA: Insufficient documentation

## 2012-01-24 DIAGNOSIS — Z8619 Personal history of other infectious and parasitic diseases: Secondary | ICD-10-CM | POA: Insufficient documentation

## 2012-01-24 DIAGNOSIS — S0990XA Unspecified injury of head, initial encounter: Secondary | ICD-10-CM | POA: Insufficient documentation

## 2012-01-24 DIAGNOSIS — J45909 Unspecified asthma, uncomplicated: Secondary | ICD-10-CM | POA: Insufficient documentation

## 2012-01-24 DIAGNOSIS — Z87440 Personal history of urinary (tract) infections: Secondary | ICD-10-CM | POA: Insufficient documentation

## 2012-01-24 DIAGNOSIS — Z87891 Personal history of nicotine dependence: Secondary | ICD-10-CM | POA: Insufficient documentation

## 2012-01-24 DIAGNOSIS — W19XXXA Unspecified fall, initial encounter: Secondary | ICD-10-CM

## 2012-01-24 DIAGNOSIS — I951 Orthostatic hypotension: Secondary | ICD-10-CM | POA: Insufficient documentation

## 2012-01-24 DIAGNOSIS — F309 Manic episode, unspecified: Secondary | ICD-10-CM | POA: Insufficient documentation

## 2012-01-24 MED ORDER — CYCLOBENZAPRINE HCL 10 MG PO TABS: 5.0000 mg | ORAL_TABLET | Freq: Two times a day (BID) | ORAL | Status: DC | PRN

## 2012-01-24 MED ORDER — IBUPROFEN 600 MG PO TABS: 600.0000 mg | ORAL_TABLET | Freq: Four times a day (QID) | ORAL | Status: DC | PRN

## 2012-01-24 NOTE — ED Notes (Signed)
Pt states this morning she fell down stairs this morning and hit her head on concrete, complaining of neck pain radiating down to mid back, severe headache, pt states she blacked out when she hit her head. Pt states having blurred vision. Pt states she had a period today where it was hard for her to speak, states she was too tired and wanted to go to sleep, states she was here earlier today to be seen but had to leave and she decided to come back. No difficulty w/ ambulation.

## 2012-01-24 NOTE — ED Notes (Signed)
C collar removed per order.

## 2012-01-24 NOTE — ED Notes (Signed)
Patient returned from X-ray 

## 2012-01-24 NOTE — ED Notes (Signed)
Patient reports that she tripped on her shoe and fell down 15 stairs. Patient states she bounced down the steps and is now having posterior neck pain, upper and mid back pain. MAE. Patient states she has pressure behind her eyes and feels really tired. Patient thinks she passed out briefly.

## 2012-01-24 NOTE — ED Notes (Signed)
Pt states she took Tylenol 1000mg  at 1830. Pt states her neck still hurts and it stiff. Pt states pain in neck radiates down spine. C-collar provided for stabilization upon assessment.

## 2012-01-24 NOTE — ED Provider Notes (Signed)
History     CSN: [id number]  Arrival date & time 01/24/12  1843   First MD Initiated Contact with Patient 01/24/12 1956      No chief complaint on file.   (Consider location/radiation/quality/duration/timing/severity/associated sxs/prior treatment) HPI Comments: Patient states she fell down.  Her stairs, today, from the cervical to the first for approximately 10 to 12 steps, hitting the back of her head on concrete.  She immediately had neck pain, and headache.  She laid around most of the, day.  She didn't dose of Tylenol about 6 PM, which did help with her headache.  She's had persistent neck pain.  Since, then she's had no nausea, or vomiting.  No visual changes, or previous injury to her head or neck.  She did scrape her right knee, which has a superficial abrasion.  Last tetanus shot was approximately 2 years ago. She is currently 5 weeks postpartum, vaginal delivery, with no increase in abdominal pain.  The history is provided by the patient.    Past Medical History  Diagnosis Date  . Orthostatic hypotension   . Bradycardia   . Asthma   . Urinary tract infection   . Depression   . Ovarian cyst   . PID (pelvic inflammatory disease)   . Abnormal Pap smear   . Chlamydia   . Bipolar 1 disorder     Past Surgical History  Procedure Date  . Dilation and curettage of uterus     Family History  Problem Relation Age of Onset  . Adopted: Yes  . Anesthesia problems Neg Hx     History  Substance Use Topics  . Smoking status: Former Smoker    Types: Cigarettes    Quit date: 03/29/2011  . Smokeless tobacco: Never Used   Comment: with positive preg test  . Alcohol Use: No    OB History    Grav Para Term Preterm Abortions TAB SAB Ect Mult Living   3 2 2  0 1 0 1 0 0 2      Review of Systems  Constitutional: Negative for fever and chills.  Eyes: Negative for visual disturbance.  Cardiovascular: Negative for leg swelling.  Musculoskeletal: Negative for joint  swelling.  Skin: Positive for wound.  Neurological: Positive for dizziness and headaches. Negative for weakness and numbness.    Allergies  Review of patient's allergies indicates no known allergies.  Home Medications   Current Outpatient Rx  Name Route Sig Dispense Refill  . OMEGA-3 FATTY ACIDS 1000 MG PO CAPS Oral Take 1 g by mouth daily.    Marland Kitchen PRENATAL MULTIVITAMIN CH Oral Take 1 tablet by mouth daily.    . CYCLOBENZAPRINE HCL 10 MG PO TABS Oral Take 0.5 tablets (5 mg total) by mouth 2 (two) times daily as needed for muscle spasms. 20 tablet 0  . IBUPROFEN 600 MG PO TABS Oral Take 1 tablet (600 mg total) by mouth every 6 (six) hours as needed for pain. 30 tablet 0    BP 100/57  Pulse 58  Temp 98.5 F (36.9 C) (Oral)  Resp 18  SpO2 100%  Breastfeeding? No  Physical Exam  Constitutional: She appears well-developed and well-nourished.  HENT:  Head: Normocephalic.  Eyes: Pupils are equal, round, and reactive to light.  Neck: Muscular tenderness present.    Cardiovascular: Normal rate.   Pulmonary/Chest: Effort normal.  Abdominal: Soft.  Musculoskeletal: Normal range of motion.  Neurological: She is alert.  Skin: Skin is warm and dry. There is erythema.  Superficial abrasion, and erythema to the anterior portion of the right knee    ED Course  Procedures (including critical care time)  Labs Reviewed - No data to display Dg Cervical Spine Complete  01/24/2012  *RADIOLOGY REPORT*  Clinical Data: Pain post fall.  CERVICAL SPINE - COMPLETE 4+ VIEW  Comparison:  None  Findings: Straightening of the normal cervical lordosis.  Negative for fracture, dislocation, or other acute bony abnormality.  No significant osseous degenerative change.  No prevertebral soft tissue swelling.  IMPRESSION: 1.  Negative for fracture or other acute bony abnormality. 2. Loss of the normal cervical spine lordosis, which may be secondary to positioning, spasm, or soft tissue injury.   Original  Report Authenticated By: Osa Craver, M.D.    Ct Head Wo Contrast  01/24/2012  *RADIOLOGY REPORT*  Clinical Data: Fall.  Loss of consciousness.  CT HEAD WITHOUT CONTRAST  Technique:  Contiguous axial images were obtained from the base of the skull through the vertex without contrast.  Comparison: None.  Findings: No acute intracranial abnormality is present. Specifically, there is no evidence for acute infarct, hemorrhage, mass, hydrocephalus, or extra-axial fluid collection.  The paranasal sinuses and mastoid air cells are clear.  The globes and orbits are intact.  The osseous skull is intact.  IMPRESSION: Negative CT of the head.   Original Report Authenticated By: Jamesetta Orleans. MATTERN, M.D.      1. Fall   2. Cervical strain, acute       MDM   Will CT head 22 reported, loss of consciousness, as well as obtaining x-ray of her C-spine due to right lateral neck pain, and persistence of symptoms since this morning        Arman Filter, NP 01/24/12 2050

## 2012-01-25 NOTE — ED Provider Notes (Signed)
Medical screening examination/treatment/procedure(s) were performed by non-physician practitioner and as supervising physician I was immediately available for consultation/collaboration.  Cheri Guppy, MD 01/25/12 0002

## 2012-04-19 ENCOUNTER — Encounter: Payer: Self-pay | Admitting: Gastroenterology

## 2012-04-25 ENCOUNTER — Ambulatory Visit: Payer: BC Managed Care – PPO | Admitting: Gastroenterology

## 2013-04-06 ENCOUNTER — Inpatient Hospital Stay (HOSPITAL_COMMUNITY): Payer: BC Managed Care – PPO

## 2013-04-06 ENCOUNTER — Encounter (HOSPITAL_COMMUNITY): Payer: Self-pay | Admitting: *Deleted

## 2013-04-06 ENCOUNTER — Inpatient Hospital Stay (HOSPITAL_COMMUNITY)
Admission: AD | Admit: 2013-04-06 | Discharge: 2013-04-06 | Disposition: A | Discharge status: Home / Self Care | Payer: BC Managed Care – PPO | Source: Ambulatory Visit | Attending: Obstetrics and Gynecology | Admitting: Obstetrics and Gynecology

## 2013-04-06 DIAGNOSIS — N949 Unspecified condition associated with female genital organs and menstrual cycle: Secondary | ICD-10-CM | POA: Insufficient documentation

## 2013-04-06 DIAGNOSIS — N83209 Unspecified ovarian cyst, unspecified side: Secondary | ICD-10-CM | POA: Insufficient documentation

## 2013-04-06 DIAGNOSIS — R1032 Left lower quadrant pain: Secondary | ICD-10-CM

## 2013-04-06 DIAGNOSIS — Z87891 Personal history of nicotine dependence: Secondary | ICD-10-CM | POA: Insufficient documentation

## 2013-04-06 LAB — CBC
HCT: 33.5 % — ABNORMAL LOW (ref 36.0–46.0)
Hemoglobin: 10.9 g/dL — ABNORMAL LOW (ref 12.0–15.0)
MCH: 27.2 pg (ref 26.0–34.0)
MCHC: 32.5 g/dL (ref 30.0–36.0)
MCV: 83.5 fL (ref 78.0–100.0)
PLATELETS: 229 10*3/uL (ref 150–400)
RBC: 4.01 MIL/uL (ref 3.87–5.11)
RDW: 12.9 % (ref 11.5–15.5)
WBC: 7.3 10*3/uL (ref 4.0–10.5)

## 2013-04-06 LAB — URINALYSIS, ROUTINE W REFLEX MICROSCOPIC
BILIRUBIN URINE: NEGATIVE
GLUCOSE, UA: NEGATIVE mg/dL
Hgb urine dipstick: NEGATIVE
KETONES UR: NEGATIVE mg/dL
LEUKOCYTES UA: NEGATIVE
Nitrite: NEGATIVE
PH: 6.5 (ref 5.0–8.0)
PROTEIN: NEGATIVE mg/dL
Specific Gravity, Urine: 1.025 (ref 1.005–1.030)
Urobilinogen, UA: 0.2 mg/dL (ref 0.0–1.0)

## 2013-04-06 LAB — WET PREP, GENITAL
CLUE CELLS WET PREP: NONE SEEN
TRICH WET PREP: NONE SEEN
Yeast Wet Prep HPF POC: NONE SEEN

## 2013-04-06 LAB — POCT PREGNANCY, URINE: Preg Test, Ur: NEGATIVE

## 2013-04-06 MED ORDER — IBUPROFEN 600 MG PO TABS: 600.0000 mg | ORAL_TABLET | Freq: Four times a day (QID) | ORAL | Status: DC | PRN

## 2013-04-06 MED ORDER — KETOROLAC TROMETHAMINE 60 MG/2ML IM SOLN
60.0000 mg | Freq: Once | INTRAMUSCULAR | Status: AC
  Administered 2013-04-06: 60 mg via INTRAMUSCULAR
  Filled 2013-04-06: qty 2

## 2013-04-06 NOTE — MAU Provider Note (Signed)
History     CSN: [id number]  Arrival date and time: 04/06/13 1845   First Provider Initiated Contact with Patient 04/06/13 1919      Chief Complaint  Patient presents with  . Abdominal Pain   HPI Comments: Kathy [name] 23 y.o. [id number] presents to MAU with Left LQ pains that have been ongoing for 2 weeks. It was worse initially with sharp pains. She has Nexplanon for birth control and has a history of ovarian cysts.      Past Medical History  Diagnosis Date  . Orthostatic hypotension   . Bradycardia   . Asthma   . Urinary tract infection   . Depression   . Ovarian cyst   . PID (pelvic inflammatory disease)   . Abnormal Pap smear   . Chlamydia   . Bipolar 1 disorder     Past Surgical History  Procedure Laterality Date  . Dilation and curettage of uterus    . Tonsillectomy      Family History  Problem Relation Age of Onset  . Adopted: Yes  . Anesthesia problems Neg Hx   . Breast cancer Maternal Grandmother     History  Substance Use Topics  . Smoking status: Former Smoker    Types: Cigarettes    Quit date: 03/29/2011  . Smokeless tobacco: Never Used     Comment: with positive preg test  . Alcohol Use: No    Allergies: No Known Allergies  Prescriptions prior to admission  Medication Sig Dispense Refill  . albuterol (PROVENTIL HFA;VENTOLIN HFA) 108 (90 BASE) MCG/ACT inhaler Inhale 2 puffs into the lungs every 6 (six) hours as needed (asthma).      . GARLIC PO Take 1 capsule by mouth daily.      Marland Kitchen. PRESCRIPTION MEDICATION Take 1 capsule by mouth 2 (two) times daily. Pt states taking antibiotic twice daily, unknown drug/strength.        Review of Systems  Constitutional: Negative.   HENT: Negative.   Eyes: Negative.   Respiratory: Negative.   Cardiovascular: Negative.   Gastrointestinal: Negative.   Genitourinary:       Left lower quad pain  Skin: Negative.   Neurological: Negative.   Psychiatric/Behavioral: The patient is nervous/anxious.     Physical Exam   Blood pressure 99/57, pulse 68, temperature 98.2 F (36.8 C), temperature source Oral, resp. rate 16, height 5\' 5"  (1.651 m), weight 79.436 kg (175 lb 2 oz), not currently breastfeeding.  Physical Exam  Constitutional: She is oriented to person, place, and time. She appears well-developed and well-nourished. No distress.  HENT:  Head: Normocephalic and atraumatic.  Eyes: Pupils are equal, round, and reactive to light.  Cardiovascular: Normal rate and regular rhythm.   Respiratory: Effort normal and breath sounds normal.  GI: Soft. Bowel sounds are normal.  Genitourinary:  Genitalia: External: Negative Vag: small amount white discharge Cervix: slight erosion Biman: Fullness / tenderness in and around left ovary   Neurological: She is alert and oriented to person, place, and time.  Skin: Skin is warm and dry.  Psychiatric: She has a normal mood and affect. Her behavior is normal. Judgment and thought content normal.   Results for orders placed during the hospital encounter of 04/06/13 (from the past 24 hour(s))  URINALYSIS, ROUTINE W REFLEX MICROSCOPIC     Status: None   Collection Time    04/06/13  7:05 PM      Result Value Range   Color, Urine YELLOW  YELLOW  APPearance CLEAR  CLEAR   Specific Gravity, Urine 1.025  1.005 - 1.030   pH 6.5  5.0 - 8.0   Glucose, UA NEGATIVE  NEGATIVE mg/dL   Hgb urine dipstick NEGATIVE  NEGATIVE   Bilirubin Urine NEGATIVE  NEGATIVE   Ketones, ur NEGATIVE  NEGATIVE mg/dL   Protein, ur NEGATIVE  NEGATIVE mg/dL   Urobilinogen, UA 0.2  0.0 - 1.0 mg/dL   Nitrite NEGATIVE  NEGATIVE   Leukocytes, UA NEGATIVE  NEGATIVE  POCT PREGNANCY, URINE     Status: None   Collection Time    04/06/13  7:18 PM      Result Value Range   Preg Test, Ur NEGATIVE  NEGATIVE  WET PREP, GENITAL     Status: Abnormal   Collection Time    04/06/13  7:32 PM      Result Value Range   Yeast Wet Prep HPF POC NONE SEEN  NONE SEEN   Trich, Wet Prep NONE  SEEN  NONE SEEN   Clue Cells Wet Prep HPF POC NONE SEEN  NONE SEEN   WBC, Wet Prep HPF POC MODERATE BACTERIA SEEN (*) NONE SEEN  CBC     Status: Abnormal   Collection Time    04/06/13  7:45 PM      Result Value Range   WBC 7.3  4.0 - 10.5 K/uL   RBC 4.01  3.87 - 5.11 MIL/uL   Hemoglobin 10.9 (*) 12.0 - 15.0 g/dL   HCT 16.1 (*) 09.6 - 04.5 %   MCV 83.5  78.0 - 100.0 fL   MCH 27.2  26.0 - 34.0 pg   MCHC 32.5  30.0 - 36.0 g/dL   RDW 40.9  81.1 - 91.4 %   Platelets 229  150 - 400 K/uL    MAU Course  Procedures  MDM  Toradol 60 mg IM CBC Pelvic ultrasound  Assessment and Plan   Care turned over to [facility] CNMW at 9 pm  Carolynn Serve 04/06/2013, 7:25 PM

## 2013-04-06 NOTE — MAU Note (Signed)
Patient presents with complaint of lower left abdominal pain X 2 weeks.

## 2013-04-06 NOTE — MAU Provider Note (Signed)
History     CSN: [id number]  Arrival date and time: 04/06/13 1845   First Provider Initiated Contact with Patient 04/06/13 1919      Chief Complaint  Patient presents with  . Abdominal Pain   HPI Comments: Kathy [name] 23 y.o. [id number] presents to MAU with Left LQ pains that have been ongoing for 2 weeks. It was worse initially with sharp pains. She has Nexplanon for birth control and has a history of ovarian cysts.   Abdominal Pain      Past Medical History  Diagnosis Date  . Orthostatic hypotension   . Bradycardia   . Asthma   . Urinary tract infection   . Depression   . Ovarian cyst   . PID (pelvic inflammatory disease)   . Abnormal Pap smear   . Chlamydia   . Bipolar 1 disorder     Past Surgical History  Procedure Laterality Date  . Dilation and curettage of uterus    . Tonsillectomy      Family History  Problem Relation Age of Onset  . Adopted: Yes  . Anesthesia problems Neg Hx   . Breast cancer Maternal Grandmother     History  Substance Use Topics  . Smoking status: Former Smoker    Types: Cigarettes    Quit date: 03/29/2011  . Smokeless tobacco: Never Used     Comment: with positive preg test  . Alcohol Use: No    Allergies: No Known Allergies  Prescriptions prior to admission  Medication Sig Dispense Refill  . albuterol (PROVENTIL HFA;VENTOLIN HFA) 108 (90 BASE) MCG/ACT inhaler Inhale 2 puffs into the lungs every 6 (six) hours as needed (asthma).      . GARLIC PO Take 1 capsule by mouth daily.      Marland Kitchen PRESCRIPTION MEDICATION Take 1 capsule by mouth 2 (two) times daily. Pt states taking antibiotic twice daily, unknown drug/strength.        Review of Systems  Constitutional: Negative.   HENT: Negative.   Eyes: Negative.   Respiratory: Negative.   Cardiovascular: Negative.   Gastrointestinal: Positive for abdominal pain.  Genitourinary:       Left lower quad pain  Skin: Negative.   Neurological: Negative.    Psychiatric/Behavioral: The patient is nervous/anxious.    Physical Exam   Blood pressure 99/57, pulse 68, temperature 98.2 F (36.8 C), temperature source Oral, resp. rate 16, height 5\' 5"  (1.651 m), weight 79.436 kg (175 lb 2 oz), not currently breastfeeding.  Physical Exam  Constitutional: She is oriented to person, place, and time. She appears well-developed and well-nourished. No distress.  HENT:  Head: Normocephalic and atraumatic.  Eyes: Pupils are equal, round, and reactive to light.  Cardiovascular: Normal rate and regular rhythm.   Respiratory: Effort normal and breath sounds normal.  GI: Soft. Bowel sounds are normal.  Genitourinary:  Genitalia: External: Negative Vag: small amount white discharge Cervix: slight erosion Biman: Fullness / tenderness in and around left ovary   Neurological: She is alert and oriented to person, place, and time.  Skin: Skin is warm and dry.  Psychiatric: She has a normal mood and affect. Her behavior is normal. Judgment and thought content normal.   Results for orders placed during the hospital encounter of 04/06/13 (from the past 24 hour(s))  URINALYSIS, ROUTINE W REFLEX MICROSCOPIC     Status: None   Collection Time    04/06/13  7:05 PM      Result Value Range  Color, Urine YELLOW  YELLOW   APPearance CLEAR  CLEAR   Specific Gravity, Urine 1.025  1.005 - 1.030   pH 6.5  5.0 - 8.0   Glucose, UA NEGATIVE  NEGATIVE mg/dL   Hgb urine dipstick NEGATIVE  NEGATIVE   Bilirubin Urine NEGATIVE  NEGATIVE   Ketones, ur NEGATIVE  NEGATIVE mg/dL   Protein, ur NEGATIVE  NEGATIVE mg/dL   Urobilinogen, UA 0.2  0.0 - 1.0 mg/dL   Nitrite NEGATIVE  NEGATIVE   Leukocytes, UA NEGATIVE  NEGATIVE  POCT PREGNANCY, URINE     Status: None   Collection Time    04/06/13  7:18 PM      Result Value Range   Preg Test, Ur NEGATIVE  NEGATIVE  WET PREP, GENITAL     Status: Abnormal   Collection Time    04/06/13  7:32 PM      Result Value Range   Yeast Wet  Prep HPF POC NONE SEEN  NONE SEEN   Trich, Wet Prep NONE SEEN  NONE SEEN   Clue Cells Wet Prep HPF POC NONE SEEN  NONE SEEN   WBC, Wet Prep HPF POC MODERATE BACTERIA SEEN (*) NONE SEEN  CBC     Status: Abnormal   Collection Time    04/06/13  7:45 PM      Result Value Range   WBC 7.3  4.0 - 10.5 K/uL   RBC 4.01  3.87 - 5.11 MIL/uL   Hemoglobin 10.9 (*) 12.0 - 15.0 g/dL   HCT 16.133.5 (*) 09.636.0 - 04.546.0 %   MCV 83.5  78.0 - 100.0 fL   MCH 27.2  26.0 - 34.0 pg   MCHC 32.5  30.0 - 36.0 g/dL   RDW 40.912.9  81.111.5 - 91.415.5 %   Platelets 229  150 - 400 K/uL   Ultrasound: FINDINGS:  Uterus  Measurements: 9.0 x 3.8 x 5.1 cm. No fibroids or other mass  visualized.  Endometrium  Thickness: 2 mm. No focal abnormality visualized.  Right ovary  Measurements: 2.9 x 1.8 x 2.2 cm. Normal appearance/no adnexal mass.  Left ovary  Measurements: 5.0 x 2.4 x 3.8 cm. 2.4 x 2.0 x 2.7 cm simple  cyst/follicle.  Other findings  Trace pelvic fluid.  IMPRESSION:  2.7 cm simple left ovarian cyst/follicle, likely physiologic.  Negative pelvic ultrasound.   MAU Course  Procedures  MDM  Toradol 60 mg IM CBC Pelvic ultrasound  Assessment and Plan  Pelvic Pain - Simple Cyst  Plan: Discharge to home Ibuprofen prn for pain Follow-up prn  [facility]MUHAMMAD,Hershel Corkery 04/06/2013, 9:33 PM

## 2013-04-06 NOTE — Progress Notes (Signed)
Kathy [name] CNM in earlier and discussed test results and d/c plan. Written and verbal d/c instructions given and understanding vocied.

## 2013-04-09 LAB — GC/CHLAMYDIA PROBE AMP
CT PROBE, AMP APTIMA: INVALID
GC PROBE AMP APTIMA: INVALID

## 2013-09-07 ENCOUNTER — Emergency Department (HOSPITAL_COMMUNITY)
Admission: EM | Admit: 2013-09-07 | Discharge: 2013-09-07 | Disposition: A | Discharge status: Home / Self Care | Payer: BC Managed Care – PPO | Attending: Emergency Medicine | Admitting: Emergency Medicine

## 2013-09-07 ENCOUNTER — Encounter (HOSPITAL_COMMUNITY): Payer: Self-pay | Admitting: Emergency Medicine

## 2013-09-07 DIAGNOSIS — Z87440 Personal history of urinary (tract) infections: Secondary | ICD-10-CM | POA: Insufficient documentation

## 2013-09-07 DIAGNOSIS — Z8679 Personal history of other diseases of the circulatory system: Secondary | ICD-10-CM | POA: Insufficient documentation

## 2013-09-07 DIAGNOSIS — Z8619 Personal history of other infectious and parasitic diseases: Secondary | ICD-10-CM | POA: Insufficient documentation

## 2013-09-07 DIAGNOSIS — R519 Headache, unspecified: Secondary | ICD-10-CM

## 2013-09-07 DIAGNOSIS — J45909 Unspecified asthma, uncomplicated: Secondary | ICD-10-CM | POA: Insufficient documentation

## 2013-09-07 DIAGNOSIS — Z8659 Personal history of other mental and behavioral disorders: Secondary | ICD-10-CM | POA: Insufficient documentation

## 2013-09-07 DIAGNOSIS — Z8742 Personal history of other diseases of the female genital tract: Secondary | ICD-10-CM | POA: Insufficient documentation

## 2013-09-07 DIAGNOSIS — F17200 Nicotine dependence, unspecified, uncomplicated: Secondary | ICD-10-CM | POA: Insufficient documentation

## 2013-09-07 DIAGNOSIS — R51 Headache: Secondary | ICD-10-CM | POA: Insufficient documentation

## 2013-09-07 NOTE — Discharge Instructions (Signed)

## 2013-09-07 NOTE — ED Notes (Signed)
Pt states that she had a bad headache yesterday and when she went to bed last night she had left sided facial numbness and then woke up with it this morning as well.  Pt has symmetrical smile and no facial deficits.

## 2013-09-07 NOTE — ED Provider Notes (Signed)
CSN: [id number]     Arrival date & time 09/07/13  1044 History   First MD Initiated Contact with Patient 09/07/13 1340     Chief Complaint  Patient presents with  . facial numbness      (Consider location/radiation/quality/duration/timing/severity/associated sxs/prior Treatment) The history is provided by the patient.   Here complaining of headache that began yesterday characterized as dull bitemporal pain that lasted for approximately one hour and is since subsided. No associated vomiting. No visual changes. No focal neurological deficits. When she woke this point, she left facial paresthesias. She denies any trouble closing her eye. Denies any numbness to her tongue or change in her taste. States she's had no ataxia. No speechChanges. The left-sided facial numbness is sporadic and nothing seems to make it better or worse. No treatment used prior to arrival. Past Medical History  Diagnosis Date  . Orthostatic hypotension   . Bradycardia   . Asthma   . Urinary tract infection   . Depression   . Ovarian cyst   . PID (pelvic inflammatory disease)   . Abnormal Pap smear   . Chlamydia   . Bipolar 1 disorder    Past Surgical History  Procedure Laterality Date  . Dilation and curettage of uterus    . Tonsillectomy     Family History  Problem Relation Age of Onset  . Adopted: Yes  . Anesthesia problems Neg Hx   . Breast cancer Maternal Grandmother    History  Substance Use Topics  . Smoking status: Current Every Day Smoker    Types: Cigarettes    Last Attempt to Quit: 03/29/2011  . Smokeless tobacco: Never Used     Comment: with positive preg test  . Alcohol Use: No   OB History   Grav Para Term Preterm Abortions TAB SAB Ect Mult Living   3 2 2  0 1 0 1 0 0 2     Review of Systems  All other systems reviewed and are negative.     Allergies  Review of patient's allergies indicates no known allergies.  Home Medications   Prior to Admission medications   Medication  Sig Start Date End Date Taking? Authorizing Provider  albuterol (PROVENTIL HFA;VENTOLIN HFA) 108 (90 BASE) MCG/ACT inhaler Inhale 2 puffs into the lungs every 6 (six) hours as needed (asthma).   Yes Historical Provider, MD  Multiple Vitamins-Minerals (MULTIVITAMIN WITH MINERALS) tablet Take 1 tablet by mouth daily.   Yes Historical Provider, MD   BP 118/70  Pulse 69  Temp(Src) 98.1 F (36.7 C) (Oral)  Resp 18  SpO2 100% Physical Exam  Nursing note and vitals reviewed. Constitutional: She is oriented to person, place, and time. She appears well-developed and well-nourished.  Non-toxic appearance. No distress.  HENT:  Head: Normocephalic and atraumatic.  Eyes: Conjunctivae, EOM and lids are normal. Pupils are equal, round, and reactive to light.  Neck: Normal range of motion. Neck supple. No tracheal deviation present. No mass present.  Cardiovascular: Normal rate, regular rhythm and normal heart sounds.  Exam reveals no gallop.   No murmur heard. Pulmonary/Chest: Effort normal and breath sounds normal. No stridor. No respiratory distress. She has no decreased breath sounds. She has no wheezes. She has no rhonchi. She has no rales.  Abdominal: Soft. Normal appearance and bowel sounds are normal. She exhibits no distension. There is no tenderness. There is no rebound and no CVA tenderness.  Musculoskeletal: Normal range of motion. She exhibits no edema and no tenderness.  Neurological: She is alert and oriented to person, place, and time. She has normal strength. No cranial nerve deficit or sensory deficit. GCS eye subscore is 4. GCS verbal subscore is 5. GCS motor subscore is 6.  Skin: Skin is warm and dry. No abrasion and no rash noted.  Psychiatric: She has a normal mood and affect. Her speech is normal and behavior is normal.    ED Course  Procedures (including critical care time) Labs Review Labs Reviewed - No data to display  Imaging Review No results found.   EKG  Interpretation None      MDM   Final diagnoses:  None    Neurological exam is normal here. She has no change in sensation in her face. No rashes on her face noted. She has noted some bilateral lower extremity numbness over the past several months which is been intermittent. I did speak with her about the possibility of MS and I will give her referral to neurology. No concern this time for subarachnoid hemorrhage.    Toy BakerAnthony T Creg Gilmer, MD 09/07/13 [phone]

## 2013-09-07 NOTE — ED Notes (Signed)
Pt from home and reports that she has had L sided facial numbness x1 year. Pt adds that she "I feel like shit and out of it." Pt reports that she is a single mother to 2 toddlers and maybe she is just tired. Pt is A&O x4 and all neuro assessments are WNL. Pt in NAD and denies pain. Pt also denies CP, SOB, N/V/D, fever. Pt denies hx of migraines

## 2013-09-25 ENCOUNTER — Ambulatory Visit: Payer: BC Managed Care – PPO | Admitting: Neurology

## 2013-09-30 ENCOUNTER — Encounter: Payer: Self-pay | Admitting: *Deleted

## 2013-10-02 ENCOUNTER — Encounter: Payer: Self-pay | Admitting: Neurology

## 2013-10-02 ENCOUNTER — Ambulatory Visit (INDEPENDENT_AMBULATORY_CARE_PROVIDER_SITE_OTHER): Payer: BC Managed Care – PPO | Admitting: Neurology

## 2013-10-02 VITALS — BP 98/64 | HR 66 | Temp 98.1°F | Resp 18 | Ht 65.0 in | Wt 169.5 lb

## 2013-10-02 DIAGNOSIS — R200 Anesthesia of skin: Secondary | ICD-10-CM

## 2013-10-02 DIAGNOSIS — R202 Paresthesia of skin: Secondary | ICD-10-CM

## 2013-10-02 DIAGNOSIS — R209 Unspecified disturbances of skin sensation: Secondary | ICD-10-CM

## 2013-10-02 DIAGNOSIS — G43009 Migraine without aura, not intractable, without status migrainosus: Secondary | ICD-10-CM

## 2013-10-02 NOTE — Progress Notes (Signed)
NEUROLOGY CONSULTATION NOTE  Kathy [name] MRN: [id number] DOB: Dec 29, 1990  Referring provider: Lorre NickAnthony Allen, MD (ED) Primary care provider: Candice Campavid Lowe, MD  Reason for consult:  Headache, facial numbness  HISTORY OF PRESENT ILLNESS: Kathy [name] is a 23 year old right-handed woman with orthostatic hypotension, bradycardia, and asthma who presents for facial numbness and headache.  Records from the ED reviewed.  Over the past 6 or 7 months, she has had approximately 3 new headaches. She will first develop numbness and tingling below her left thigh, left side of her jaw, and left side of her lip. She would then soon develop a bitemporal pressure in stabbing type headache, about a 6/10 in intensity. There is no associated nausea, photophobia, or phonophobia. It would typically last one day. The headache would respond to Advil. In June, she had a similar type of headache, but she was concerned because the facial numbness lasted 2-3 days. She presented to the ER, who referred her to neurology. For about 1 month prior to this last headache, she complained of blurred vision which resolved after this last headache. She reports one previous headache, which she found to be a more typical migraine. This occurred during Christmas. It was a severe (7 8/10) pounding headache, but she does not remember laterality. There was associated photophobia and phonophobia but no nausea. It lasted approximately one day and didn't respond to Advil. Her mother has history of migraines.  For several months, she will occasionally experience tingling and burning sensation on the bottom of her feet. It would occur either lying in bed, while driving, or while standing in church. It would typically last about 10 minutes or so. Occasionally, she will also noticed some numbness in the fifth digits of both her hands. There is no associated neck pain or weakness in the upper or lower extremities.    PAST MEDICAL  HISTORY: Past Medical History  Diagnosis Date  . Orthostatic hypotension   . Bradycardia   . Asthma   . Urinary tract infection   . Depression   . Ovarian cyst   . PID (pelvic inflammatory disease)   . Abnormal Pap smear   . Chlamydia   . Bipolar 1 disorder   . Headache(784.0)   . Numbness and tingling of left side of face     PAST SURGICAL HISTORY: Past Surgical History  Procedure Laterality Date  . Dilation and curettage of uterus    . Tonsillectomy      MEDICATIONS: Current Outpatient Prescriptions on File Prior to Visit  Medication Sig Dispense Refill  . albuterol (PROVENTIL HFA;VENTOLIN HFA) 108 (90 BASE) MCG/ACT inhaler Inhale 2 puffs into the lungs every 6 (six) hours as needed (asthma).      . Multiple Vitamins-Minerals (MULTIVITAMIN WITH MINERALS) tablet Take 1 tablet by mouth daily.       No current facility-administered medications on file prior to visit.    ALLERGIES: No Known Allergies  FAMILY HISTORY: Family History  Problem Relation Age of Onset  . Adopted: Yes  . Anesthesia problems Neg Hx   . Breast cancer Maternal Grandmother     SOCIAL HISTORY: History   Social History  . Marital Status: Single    Spouse Name: N/A    Number of Children: N/A  . Years of Education: N/A   Occupational History  . Not on file.   Social History Main Topics  . Smoking status: Current Every Day Smoker    Types: Cigarettes    Last  Attempt to Quit: 03/29/2011  . Smokeless tobacco: Never Used     Comment: with positive preg test  . Alcohol Use: No  . Drug Use: No  . Sexual Activity: Yes    Birth Control/ Protection: Implant   Other Topics Concern  . Not on file   Social History Narrative  . No narrative on file    REVIEW OF SYSTEMS: Constitutional: No fevers, chills, or sweats, no generalized fatigue, change in appetite Eyes: No visual changes, double vision, eye pain Ear, nose and throat: No hearing loss, ear pain, nasal congestion, sore  throat Cardiovascular: No chest pain, palpitations Respiratory:  No shortness of breath at rest or with exertion, wheezes GastrointestinaI: No nausea, vomiting, diarrhea, abdominal pain, fecal incontinence Genitourinary:  No dysuria, urinary retention or frequency Musculoskeletal:  No neck pain, back pain Integumentary: No rash, pruritus, skin lesions Neurological: as above Psychiatric: No depression, insomnia, anxiety Endocrine: No palpitations, fatigue, diaphoresis, mood swings, change in appetite, change in weight, increased thirst Hematologic/Lymphatic:  No anemia, purpura, petechiae. Allergic/Immunologic: no itchy/runny eyes, nasal congestion, recent allergic reactions, rashes  PHYSICAL EXAM: Filed Vitals:   10/02/13 1403  BP: 98/64  Pulse: 66  Temp: 98.1 F (36.7 C)  Resp: 18   General: No acute distress Head:  Normocephalic/atraumatic Neck: supple, no paraspinal tenderness, full range of motion Back: No paraspinal tenderness Heart: regular rate and rhythm Lungs: Clear to auscultation bilaterally. Vascular: No carotid bruits. Neurological Exam: Mental status: alert and oriented to person, place, and time, recent and remote memory intact, fund of knowledge intact, attention and concentration intact, speech fluent and not dysarthric, language intact. Cranial nerves: CN I: not tested CN II: pupils equal, round and reactive to light, visual fields intact, fundi unremarkable, without vessel changes, exudates, hemorrhages or papilledema. CN III, IV, VI:  full range of motion, no nystagmus, no ptosis CN V: facial sensation intact CN VII: upper and lower face symmetric CN VIII: hearing intact CN IX, X: gag intact, uvula midline CN XI: sternocleidomastoid and trapezius muscles intact CN XII: tongue midline Bulk & Tone: normal, no fasciculations. Motor: 5 out of 5 throughout Sensation: Temperature and vibration intact Deep Tendon Reflexes: 2+ throughout, toes downgoing Finger  to nose testing: No dysmetria Heel to shin: No dysmetria Gait: Normal station and stride, able to turn and walk in tandem. Romberg negative.  IMPRESSION: Probable migraine without aura.   PLAN: Since these are new headaches, and she does have symptoms of numbness and tingling, we will get an MRI of the brain with and without contrast. Since the headaches are infrequent, no further medical management at this time. No followup necessary unless the MRI is revealing of an abnormality or if she develops increase frequency of headaches.  45 minutes spent with the patient, over 50% spent counseling and coordinating care.  Thank you for allowing me to take part in the care of this patient.  Shon MilletAdam Ai Sonnenfeld, DO  CC:  Candice Campavid Lowe, MD

## 2013-10-02 NOTE — Patient Instructions (Addendum)
Most likely it is a migraine. To be sure, we will get MRI of the brain. 10/21/13 4:45pm [facility]

## 2013-10-21 ENCOUNTER — Ambulatory Visit (HOSPITAL_COMMUNITY): Admission: RE | Admit: 2013-10-21 | Payer: BC Managed Care – PPO | Source: Ambulatory Visit

## 2013-12-07 ENCOUNTER — Emergency Department (HOSPITAL_COMMUNITY)
Admission: EM | Admit: 2013-12-07 | Discharge: 2013-12-08 | Disposition: A | Discharge status: Home / Self Care | Payer: BC Managed Care – PPO | Attending: Emergency Medicine | Admitting: Emergency Medicine

## 2013-12-07 ENCOUNTER — Encounter (HOSPITAL_COMMUNITY): Payer: Self-pay | Admitting: Emergency Medicine

## 2013-12-07 DIAGNOSIS — N949 Unspecified condition associated with female genital organs and menstrual cycle: Secondary | ICD-10-CM | POA: Diagnosis present

## 2013-12-07 DIAGNOSIS — Z3202 Encounter for pregnancy test, result negative: Secondary | ICD-10-CM | POA: Diagnosis not present

## 2013-12-07 DIAGNOSIS — Z79899 Other long term (current) drug therapy: Secondary | ICD-10-CM | POA: Diagnosis not present

## 2013-12-07 DIAGNOSIS — R109 Unspecified abdominal pain: Secondary | ICD-10-CM | POA: Insufficient documentation

## 2013-12-07 DIAGNOSIS — Z8619 Personal history of other infectious and parasitic diseases: Secondary | ICD-10-CM | POA: Diagnosis not present

## 2013-12-07 DIAGNOSIS — J45909 Unspecified asthma, uncomplicated: Secondary | ICD-10-CM | POA: Diagnosis not present

## 2013-12-07 DIAGNOSIS — F17200 Nicotine dependence, unspecified, uncomplicated: Secondary | ICD-10-CM | POA: Diagnosis not present

## 2013-12-07 DIAGNOSIS — N390 Urinary tract infection, site not specified: Secondary | ICD-10-CM | POA: Insufficient documentation

## 2013-12-07 DIAGNOSIS — Z8659 Personal history of other mental and behavioral disorders: Secondary | ICD-10-CM | POA: Diagnosis not present

## 2013-12-07 DIAGNOSIS — Z8742 Personal history of other diseases of the female genital tract: Secondary | ICD-10-CM | POA: Insufficient documentation

## 2013-12-07 LAB — URINALYSIS, ROUTINE W REFLEX MICROSCOPIC
Bilirubin Urine: NEGATIVE
GLUCOSE, UA: NEGATIVE mg/dL
HGB URINE DIPSTICK: NEGATIVE
Ketones, ur: NEGATIVE mg/dL
Nitrite: NEGATIVE
PH: 7 (ref 5.0–8.0)
PROTEIN: NEGATIVE mg/dL
Specific Gravity, Urine: 1.007 (ref 1.005–1.030)
Urobilinogen, UA: 0.2 mg/dL (ref 0.0–1.0)

## 2013-12-07 LAB — URINE MICROSCOPIC-ADD ON

## 2013-12-07 LAB — PREGNANCY, URINE: Preg Test, Ur: NEGATIVE

## 2013-12-07 MED ORDER — KETOROLAC TROMETHAMINE 30 MG/ML IJ SOLN
30.0000 mg | Freq: Once | INTRAMUSCULAR | Status: AC
  Administered 2013-12-08: 30 mg via INTRAVENOUS
  Filled 2013-12-07: qty 1

## 2013-12-07 MED ORDER — DEXTROSE 5 % IV SOLN
1.0000 g | Freq: Once | INTRAVENOUS | Status: AC
  Administered 2013-12-08: 1 g via INTRAVENOUS
  Filled 2013-12-07: qty 10

## 2013-12-07 MED ORDER — MORPHINE SULFATE 4 MG/ML IJ SOLN: 4.0000 mg | Freq: Once | INTRAMUSCULAR | Status: DC

## 2013-12-07 MED ORDER — ONDANSETRON HCL 4 MG/2ML IJ SOLN
4.0000 mg | Freq: Once | INTRAMUSCULAR | Status: AC
  Administered 2013-12-08: 4 mg via INTRAVENOUS
  Filled 2013-12-07: qty 2

## 2013-12-07 NOTE — ED Notes (Signed)
Patient c/o left flank pain, pelvic pain, and states she has a UTI per home test. Patient c/o difficulty with urination.

## 2013-12-07 NOTE — ED Provider Notes (Signed)
CSN: [id number]     Arrival date & time 12/07/13  2210 History   First MD Initiated Contact with Patient 12/07/13 2220     Chief Complaint  Patient presents with  . Flank Pain    left  . Urinary Tract Infection  . Pelvic Pain     (Consider location/radiation/quality/duration/timing/severity/associated sxs/prior Treatment) HPI  23 year old female with history of bipolar, PID, recurrent urinary tract infection who presents with complaints of dysuria. Patient reports she has low, pain for the past 2-3 days. She described pain as a sharp and cramping sensation that felt similar to prior urinary tract infection. She notice bright yellow urine.  Approximately 2 hours ago she begins to develop a sharp pain to her left abdomen that radiates to the flank. Pain has been persistent, nothing makes it better or worse. She endorsed chills, nausea, and feeling constipated although she had a bowel movement hernia. She denies fever, but endorsed headache. Headache is typical for migraine. No chest pain, shortness of breath, hematuria, vaginal bleeding. She endorsed vaginal discharge, but states that this is normal for her. Patient has remote history of any infection but states she has not had sexual activities in the past 2 months.  NO hx of kidney stone.  No prior abdominal surgery.    Past Medical History  Diagnosis Date  . Orthostatic hypotension   . Bradycardia   . Asthma   . Urinary tract infection   . Depression   . Ovarian cyst   . PID (pelvic inflammatory disease)   . Abnormal Pap smear   . Chlamydia   . Bipolar 1 disorder   . Headache(784.0)   . Numbness and tingling of left side of face    Past Surgical History  Procedure Laterality Date  . Dilation and curettage of uterus    . Tonsillectomy     Family History  Problem Relation Age of Onset  . Adopted: Yes  . Anesthesia problems Neg Hx   . Breast cancer Maternal Grandmother    History  Substance Use Topics  . Smoking status:  Current Every Day Smoker    Types: Cigarettes  . Smokeless tobacco: Never Used     Comment: with positive preg test  . Alcohol Use: No     Comment: rare   OB History   Grav Para Term Preterm Abortions TAB SAB Ect Mult Living   0 1 0 1 0 0 2     Review of Systems  All other systems reviewed and are negative.     Allergies  Diflucan  Home Medications   Prior to Admission medications   Medication Sig Start Date End Date Taking? Authorizing Provider  albuterol (PROVENTIL HFA;VENTOLIN HFA) 108 (90 BASE) MCG/ACT inhaler Inhale 2 puffs into the lungs every 6 (six) hours as needed (asthma).    Historical Provider, MD  Multiple Vitamins-Minerals (MULTIVITAMIN WITH MINERALS) tablet Take 1 tablet by mouth daily.    Historical Provider, MD   BP 144/81  Pulse 98  Temp(Src) 98.1 F (36.7 C) (Oral)  Resp 18  SpO2 100% Physical Exam  Nursing note and vitals reviewed. Constitutional: She appears well-developed and well-nourished. No distress.  HENT:  Head: Normocephalic and atraumatic.  Eyes: Conjunctivae are normal.  Neck: Normal range of motion. Neck supple.  Cardiovascular: Normal rate and regular rhythm.   Pulmonary/Chest: Effort normal and breath sounds normal. She exhibits no tenderness.  Abdominal: Soft. There is tenderness (suprapubic tenderness, no guarding or rebound).  Genitourinary:  Vagina normal and uterus normal. There is no rash or lesion on the right labia. There is no rash or lesion on the left labia. Cervix exhibits no motion tenderness and no discharge. Right adnexum displays no mass and no tenderness. Left adnexum displays no mass and no tenderness. No erythema, tenderness or bleeding around the vagina. No vaginal discharge found.  Chaperone present:  No CVA tenderness  Lymphadenopathy:       Right: No inguinal adenopathy present.       Left: No inguinal adenopathy present.    ED Course  Procedures (including critical care time)   12:14 AM Pt with  dysuria with L flank pain.  No CVA tenderness to suggest kidney stone.  Urine culture ordered due to recurrent uti.  Pelvic exam unremarkable.  Rocephin IV along with pain medication given.    12:37 AM Wet prep with many WBC, few clue cells.  Pt report not sexually active for the past 2 months.  Will send GC/Ch cultures and if positive then pt will be notify with treatment. Pt report she was tested for GC/Ch 1 month ago and sts it was negative. Her pregnancy test is negative.  She is able to tolerates PO.  Will d/c with abx as treatment for UTI/pyelo.  Return precaution discussed.    Labs Review Labs Reviewed  WET PREP, GENITAL - Abnormal; Notable for the following:    Clue Cells Wet Prep HPF POC FEW (*)    WBC, Wet Prep HPF POC MANY (*)    All other components within normal limits  BASIC METABOLIC PANEL - Abnormal; Notable for the following:    Potassium 3.4 (*)    All other components within normal limits  CBC - Abnormal; Notable for the following:    Hemoglobin 11.9 (*)    All other components within normal limits  URINALYSIS, ROUTINE W REFLEX MICROSCOPIC - Abnormal; Notable for the following:    Leukocytes, UA LARGE (*)    All other components within normal limits  URINE MICROSCOPIC-ADD ON - Abnormal; Notable for the following:    Bacteria, UA MANY (*)    All other components within normal limits  GC/CHLAMYDIA PROBE AMP  URINE CULTURE  PREGNANCY, URINE    Imaging Review No results found.   EKG Interpretation None      MDM   Final diagnoses:  UTI (lower urinary tract infection)    BP 144/81  Pulse 98  Temp(Src) 98.1 F (36.7 C) (Oral)  Resp 18  SpO2 100%  I have reviewed nursing notes and vital signs. I reviewed available ER/hospitalization records thought the EMR     Fayrene Helper, [place] 12/08/13 9147

## 2013-12-08 LAB — WET PREP, GENITAL
Trich, Wet Prep: NONE SEEN
YEAST WET PREP: NONE SEEN

## 2013-12-08 LAB — CBC
HCT: 36.9 % (ref 36.0–46.0)
HEMOGLOBIN: 11.9 g/dL — AB (ref 12.0–15.0)
MCH: 27.5 pg (ref 26.0–34.0)
MCHC: 32.2 g/dL (ref 30.0–36.0)
MCV: 85.2 fL (ref 78.0–100.0)
Platelets: 244 10*3/uL (ref 150–400)
RBC: 4.33 MIL/uL (ref 3.87–5.11)
RDW: 12.6 % (ref 11.5–15.5)
WBC: 7.6 10*3/uL (ref 4.0–10.5)

## 2013-12-08 LAB — BASIC METABOLIC PANEL
ANION GAP: 14 (ref 5–15)
BUN: 14 mg/dL (ref 6–23)
CHLORIDE: 103 meq/L (ref 96–112)
CO2: 25 mEq/L (ref 19–32)
Calcium: 9.2 mg/dL (ref 8.4–10.5)
Creatinine, Ser: 0.72 mg/dL (ref 0.50–1.10)
Glucose, Bld: 95 mg/dL (ref 70–99)
POTASSIUM: 3.4 meq/L — AB (ref 3.7–5.3)
SODIUM: 142 meq/L (ref 137–147)

## 2013-12-08 MED ORDER — CEPHALEXIN 500 MG PO CAPS: 1000.0000 mg | ORAL_CAPSULE | Freq: Two times a day (BID) | ORAL | Status: DC

## 2013-12-08 MED ORDER — ONDANSETRON HCL 4 MG PO TABS: 4.0000 mg | ORAL_TABLET | Freq: Four times a day (QID) | ORAL | Status: DC

## 2013-12-08 NOTE — ED Provider Notes (Signed)
Medical screening examination/treatment/procedure(s) were performed by non-physician practitioner and as supervising physician I was immediately available for consultation/collaboration.   Ellen Mayol L Alisse Tuite, MD 12/08/13 1017 

## 2013-12-08 NOTE — Discharge Instructions (Signed)
Pyelonephritis, Adult °Pyelonephritis is a kidney infection. A kidney infection can happen quickly, or it can last for a long time. °HOME CARE  °· Take your medicine (antibiotics) as told. Finish it even if you start to feel better. °· Keep all doctor visits as told. °· Drink enough fluids to keep your pee (urine) clear or pale yellow. °· Only take medicine as told by your doctor. °GET HELP RIGHT AWAY IF:  °· You have a fever or lasting symptoms for more than 2-3 days. °· You have a fever and your symptoms suddenly get worse. °· You cannot take your medicine or drink fluids as told. °· You have chills and shaking. °· You feel very weak or pass out (faint). °· You do not feel better after 2 days. °MAKE SURE YOU: °· Understand these instructions. °· Will watch your condition. °· Will get help right away if you are not doing well or get worse. °Document Released: 04/21/2004 Document Revised: 09/13/2011 Document Reviewed: 09/01/2010 °ExitCare® Patient Information ©2015 [employer]. This information is not intended to replace advice given to you by your health care provider. Make sure you discuss any questions you have with your health care provider. ° °

## 2013-12-09 LAB — URINE CULTURE: Colony Count: 65000

## 2013-12-09 LAB — GC/CHLAMYDIA PROBE AMP
CT Probe RNA: NEGATIVE
GC Probe RNA: NEGATIVE

## 2014-01-09 ENCOUNTER — Inpatient Hospital Stay (HOSPITAL_COMMUNITY): Payer: BC Managed Care – PPO

## 2014-01-09 ENCOUNTER — Inpatient Hospital Stay (HOSPITAL_COMMUNITY)
Admission: AD | Admit: 2014-01-09 | Discharge: 2014-01-09 | Disposition: A | Discharge status: Home / Self Care | Payer: BC Managed Care – PPO | Source: Ambulatory Visit | Attending: Obstetrics and Gynecology | Admitting: Obstetrics and Gynecology

## 2014-01-09 ENCOUNTER — Encounter (HOSPITAL_COMMUNITY): Payer: Self-pay | Admitting: *Deleted

## 2014-01-09 DIAGNOSIS — F17210 Nicotine dependence, cigarettes, uncomplicated: Secondary | ICD-10-CM | POA: Diagnosis not present

## 2014-01-09 DIAGNOSIS — R102 Pelvic and perineal pain: Secondary | ICD-10-CM

## 2014-01-09 DIAGNOSIS — G8929 Other chronic pain: Secondary | ICD-10-CM | POA: Insufficient documentation

## 2014-01-09 DIAGNOSIS — N949 Unspecified condition associated with female genital organs and menstrual cycle: Secondary | ICD-10-CM | POA: Insufficient documentation

## 2014-01-09 HISTORY — DX: Herpesviral infection, unspecified: B00.9

## 2014-01-09 LAB — URINALYSIS, ROUTINE W REFLEX MICROSCOPIC
Bilirubin Urine: NEGATIVE
GLUCOSE, UA: NEGATIVE mg/dL
KETONES UR: NEGATIVE mg/dL
NITRITE: NEGATIVE
Protein, ur: NEGATIVE mg/dL
Specific Gravity, Urine: 1.025 (ref 1.005–1.030)
UROBILINOGEN UA: 0.2 mg/dL (ref 0.0–1.0)
pH: 5.5 (ref 5.0–8.0)

## 2014-01-09 LAB — URINE MICROSCOPIC-ADD ON

## 2014-01-09 LAB — POCT PREGNANCY, URINE: PREG TEST UR: NEGATIVE

## 2014-01-09 MED ORDER — NAPROXEN SODIUM 550 MG PO TABS: 550.0000 mg | ORAL_TABLET | Freq: Two times a day (BID) | ORAL | Status: DC

## 2014-01-09 MED ORDER — PHENAZOPYRIDINE HCL 100 MG PO TABS
200.0000 mg | ORAL_TABLET | Freq: Once | ORAL | Status: AC
  Administered 2014-01-09: 200 mg via ORAL
  Filled 2014-01-09: qty 2

## 2014-01-09 MED ORDER — KETOROLAC TROMETHAMINE 60 MG/2ML IM SOLN
30.0000 mg | Freq: Once | INTRAMUSCULAR | Status: AC
  Administered 2014-01-09: 30 mg via INTRAMUSCULAR
  Filled 2014-01-09: qty 2

## 2014-01-09 NOTE — Discharge Instructions (Signed)
Your ultrasound today is normal. I spoke with Dr. Marcelle OverlieHolland who is on call today for your GYN group. He wants you to follow up in the office as schedule. You may need additional studies to try and find the reason for your pain. Take the medication for pain.

## 2014-01-09 NOTE — MAU Provider Note (Signed)
History     CSN: [id number]  Arrival date and time: 01/09/14 1202   None     Chief Complaint  Patient presents with  . Pelvic Pain  . Vaginal Pain   Patient is a 23 y.o. female presenting with pelvic pain and vaginal pain. The history is provided by the patient.  Pelvic Pain This is a new problem. The current episode started in the past 7 days. The problem occurs constantly. The problem has been gradually worsening. Associated symptoms include nausea. Pertinent negatives include no chills, fever or vomiting.  Vaginal Pain Associated symptoms include nausea. Pertinent negatives include no chills, fever or vomiting.  Kathy [name] is a 23 y.o. [id number] who is not pregnant presents to the MAU with pelvic and vaginal pain that started yesterday. She states she has had recurrent UTI's since her last vaginal delivery. She was told she had a bladder prolapse. Two days ago she noted pelvic pain and yesterday pain in the vaginal area. She states she has been tested for all the STI's x3 in the past 3 months and has not had sex in 4 months and all tests have been negative. She has an appointment with her GYN next week. She did see them a month ago when she had all the testing and was treated for a yeast infection.   OB History   Grav Para Term Preterm Abortions TAB SAB Ect Mult Living   3 2 2  0 1 0 1 0 0 2      Past Medical History  Diagnosis Date  . Orthostatic hypotension   . Bradycardia   . Asthma   . Urinary tract infection   . Depression   . Ovarian cyst   . PID (pelvic inflammatory disease)   . Abnormal Pap smear   . Chlamydia   . Bipolar 1 disorder   . Headache(784.0)   . Numbness and tingling of left side of face   . 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 Dilation and curettage of uterus    . Tonsillectomy      Family History  Problem Relation Age of Onset  . Adopted: Yes  . Anesthesia problems  Neg Hx   . Breast cancer Maternal Grandmother     History  Substance Use Topics  . Smoking status: Current Every Day Smoker -- 0.25 packs/day    Types: Cigarettes  . Smokeless tobacco: Never Used     Comment: with positive preg test  . Alcohol Use: No     Comment: rare    Allergies:  Allergies  Allergen Reactions  . Diflucan [Fluconazole] Itching    No prescriptions prior to admission    Review of Systems  Constitutional: Negative for fever and chills.  HENT: Negative.   Eyes: Negative for blurred vision and double vision.  Respiratory: Negative.   Gastrointestinal: Positive for nausea. Negative for vomiting.  Genitourinary: Positive for dysuria, frequency, vaginal pain and pelvic pain. Negative for urgency.  Musculoskeletal: Positive for back pain.  Neurological: Negative for tingling, sensory change and speech change.  Psychiatric/Behavioral: Negative for depression. The patient is not nervous/anxious.   Blood pressure 102/59, pulse 61, temperature 98.8 F (37.1 C), temperature source Oral, resp. rate 18, height 5\' 4"  (1.626 m), weight 166 lb (75.297 kg).  Physical Exam  Nursing note and vitals reviewed. Constitutional: She is oriented to person, place, and time. She appears well-developed and well-nourished. No  distress.  HENT:  Head: Normocephalic and atraumatic.  Eyes: EOM are normal.  Neck: Neck supple.  Cardiovascular: Normal rate.   Respiratory: Effort normal.  GI: Soft. There is no tenderness.  Genitourinary:  External genitalia without lesions, small blood vaginal vault. Positive CMT, bilateral adnexal tenderness, uterus without palpable enlargement.   Musculoskeletal: Normal range of motion.  Neurological: She is alert and oriented to person, place, and time.  Skin: Skin is warm and dry.  Psychiatric: She has a normal mood and affect. Her behavior is normal. Judgment and thought content normal.   Results for orders placed during the hospital encounter  of 01/09/14 (from the past 24 hour(s))  URINALYSIS, ROUTINE W REFLEX MICROSCOPIC     Status: Abnormal   Collection Time    01/09/14 12:35 PM      Result Value Ref Range   Color, Urine YELLOW  YELLOW   APPearance HAZY (*) CLEAR   Specific Gravity, Urine 1.025  1.005 - 1.030   pH 5.5  5.0 - 8.0   Glucose, UA NEGATIVE  NEGATIVE mg/dL   Hgb urine dipstick LARGE (*) NEGATIVE   Bilirubin Urine NEGATIVE  NEGATIVE   Ketones, ur NEGATIVE  NEGATIVE mg/dL   Protein, ur NEGATIVE  NEGATIVE mg/dL   Urobilinogen, UA 0.2  0.0 - 1.0 mg/dL   Nitrite NEGATIVE  NEGATIVE   Leukocytes, UA TRACE (*) NEGATIVE  URINE MICROSCOPIC-ADD ON     Status: Abnormal   Collection Time    01/09/14 12:35 PM      Result Value Ref Range   Squamous Epithelial / LPF FEW (*) RARE   WBC, UA 0-2  <3 WBC/hpf   RBC / HPF 21-50  <3 RBC/hpf  POCT PREGNANCY, URINE     Status: None   Collection Time    01/09/14 12:40 PM      Result Value Ref Range   Preg Test, Ur NEGATIVE  NEGATIVE    MAU Course  Procedures [place] Transvaginal Non-ob  01/09/2014   CLINICAL DATA:  Pelvic pain.  Irregular menses.  EXAM: TRANSABDOMINAL AND TRANSVAGINAL ULTRASOUND OF PELVIS  TECHNIQUE: Both transabdominal and transvaginal ultrasound examinations of the pelvis were performed. Transabdominal technique was performed for global imaging of the pelvis including uterus, ovaries, adnexal regions, and pelvic cul-de-sac. It was necessary to proceed with endovaginal exam following the transabdominal exam to visualize the endometrium and ovaries.  COMPARISON:  None  FINDINGS: Uterus  Measurements: 7.4 x 3.4 x 4.1 cm. No fibroids or other mass visualized.  Endometrium  Thickness: 2 mm.  No focal abnormality visualized.  Right ovary  Measurements: 3.1 x 1.8 x 2.2 cm. Normal appearance/no adnexal mass.  Left ovary  Measurements: 3.0 x 2.3 x 1.9 cm. Normal appearance/no adnexal mass.  Other findings  No free fluid.  IMPRESSION: Normal appearance of uterus and both ovaries.  No pelvic mass or other significant abnormality visualized.   Electronically Signed   By: Myles RosenthalJohn  Stahl M.D.   On: 01/09/2014 14:09   [place] Pelvis Complete  01/09/2014   CLINICAL DATA:  Pelvic pain.  Irregular menses.  EXAM: TRANSABDOMINAL AND TRANSVAGINAL ULTRASOUND OF PELVIS  TECHNIQUE: Both transabdominal and transvaginal ultrasound examinations of the pelvis were performed. Transabdominal technique was performed for global imaging of the pelvis including uterus, ovaries, adnexal regions, and pelvic cul-de-sac. It was necessary to proceed with endovaginal exam following the transabdominal exam to visualize the endometrium and ovaries.  COMPARISON:  None  FINDINGS: Uterus  Measurements: 7.4 x 3.4 x  4.1 cm. No fibroids or other mass visualized.  Endometrium  Thickness: 2 mm.  No focal abnormality visualized.  Right ovary  Measurements: 3.1 x 1.8 x 2.2 cm. Normal appearance/no adnexal mass.  Left ovary  Measurements: 3.0 x 2.3 x 1.9 cm. Normal appearance/no adnexal mass.  Other findings  No free fluid.  IMPRESSION: Normal appearance of uterus and both ovaries. No pelvic mass or other significant abnormality visualized.   Electronically Signed   By: Myles RosenthalJohn  Stahl M.D.   On: 01/09/2014 14:09    Assessment and Plan  23 y.o. female who is not pregnant with chronic pelvic pain and UTI symptoms. Consult with Dr. Marcelle OverlieHolland and he request patient keep her follow up appointment in the office as scheduled to discuss her continued pain and possible lap and possible f/u with urology for interstitial cystitis. Patient agrees with plan. Discussed with the patient and all questioned fully answered. She will follow up as scheduled or return here if any problems arise.    Medication List         albuterol 108 (90 BASE) MCG/ACT inhaler  Commonly known as:  PROVENTIL HFA;VENTOLIN HFA  Inhale 2 puffs into the lungs every 6 (six) hours as needed (asthma).     fluticasone 50 MCG/ACT nasal spray  Commonly known as:  FLONASE   Place 2 sprays into both nostrils daily as needed for allergies or rhinitis.     naproxen sodium 550 MG tablet  Commonly known as:  ANAPROX DS  Take 1 tablet (550 mg total) by mouth 2 (two) times daily with a meal.     NEXPLANON 68 MG Impl implant  Generic drug:  etonogestrel  1 each by Subdermal route once. Pt had nexplanon placed in Oct or Nov 2013     VICKS DAYQUIL COLD & FLU 10-5-325 MG Caps  Generic drug:  DM-Phenylephrine-Acetaminophen  Take 2 capsules by mouth daily as needed (for cold symptoms).         Tzivia Oneil 01/09/2014, 3:32 PM

## 2014-01-09 NOTE — MAU Note (Signed)
Keep getting UTI, had a kidney infection about a month ago.  Few days ago thought she was getting a UTI again, started bleeding.   Usually when bleeding it is a yeast infection, so took Diflucan, bleeding stopped for a day.yesterday started feeling a lot of pressure.  Been having pelvic pains.  Small ? Cysts noted in vag opening,  Vagina hurts, has not been sexually active in 4-5 months.  Dx with bladder prolapse after son was born.

## 2014-01-27 ENCOUNTER — Encounter (HOSPITAL_COMMUNITY): Payer: Self-pay | Admitting: *Deleted

## 2014-02-05 ENCOUNTER — Other Ambulatory Visit: Payer: Self-pay | Admitting: Urology

## 2014-02-17 ENCOUNTER — Encounter (HOSPITAL_BASED_OUTPATIENT_CLINIC_OR_DEPARTMENT_OTHER): Payer: Self-pay | Admitting: *Deleted

## 2014-02-18 ENCOUNTER — Encounter (HOSPITAL_BASED_OUTPATIENT_CLINIC_OR_DEPARTMENT_OTHER): Payer: Self-pay | Admitting: *Deleted

## 2014-02-18 NOTE — Progress Notes (Signed)
NPO AFTER MN.  ARRIVE AT 0730.  NEEDS HG AND URINE PREG.  

## 2014-02-24 NOTE — H&P (Signed)
Kathy [name] has chronic cystitis. There is a question that she may have interstitial cystitis. She had another positive culture of Streptococcus. She was here to discuss a renal ultrasound.    When I saw Kathy [name] in February 2014, ultrasound was normal. I gave her Keflex. She was going to take 1 after intercourse for chronic cystitis prophylaxis.  Review of Systems: No change in bowel or neurologic systems.   Kathy [name] said she has had 4 or 5 urinary tract infections. She said she is not sexually active for a long and has not been taking the postcoital Keflex. She says her doctor is concerned that she may have interstitial cystitis. She says she has abdominal pain most of the time. It is not necessarily relieved by voiding. She has had dyspareunia in the past. There are no other modifying factors or associated signs or symptoms. There are no other aggravating or relieving factors. Her presentation is moderate in severity and persisting.  Urinalysis: I reviewed, negative.     Past Medical History Problems  1. History of Asthma (J45.909) 2. History of depression (Z86.59)  Surgical History Problems  1. History of Dilation And Curettage 2. History of Tonsillectomy  Current Meds 1. No Reported Medications Recorded  Allergies Medication  1. No Known Drug Allergies  Family History Problems  1. Family history of Breast Cancer : Maternal Grandmother  Social History Problems  1. Denied: History of Alcohol Use 2. Caffeine Use 3. History of Former smoker [phone](Z87.891) 4. Marital History - Single 5. Occupation:   [employer]  Vitals Vital Signs [Data Includes: Last 1 Day]  Recorded: 06Nov2015 03:15PM  Height: 5 ft 4 in Weight: 165 lb  BMI Calculated: 28.32 BSA Calculated: 1.8 Blood Pressure: 98 / 66 Temperature: 98.3 F Heart Rate: 56  Results/Data  Urine [Data Includes: Last 1 Day]   06Nov2015  COLOR YELLOW   APPEARANCE CLOUDY   SPECIFIC GRAVITY 1.025   pH 7.0    GLUCOSE NEG mg/dL  BILIRUBIN NEG   KETONE NEG mg/dL  BLOOD NEG   PROTEIN NEG mg/dL  UROBILINOGEN 0.2 mg/dL  NITRITE NEG   LEUKOCYTE ESTERASE SMALL   SQUAMOUS EPITHELIAL/HPF FEW   WBC 7-10 WBC/hpf  RBC 0-2 RBC/hpf  BACTERIA NONE SEEN   CRYSTALS NONE SEEN   CASTS NONE SEEN    Assessment Assessed  1. Chronic cystitis (N30.20) 2. Abdominal pain (R10.9)  Discussion/Summary We talked about cystoscopy/hydrodistention and instillation in detail. Pros, cons, general surgical and anesthetic risks, and other options including watchful waiting were discussed. Risks were described but not limited to pain, infection, and bleeding. The risk of bladder perforation and management were discussed. The patient understands that it is primarily a diagnostic procedure.    Kathy [name] could had interstitial cystitis. Role of hydronephrosis discussed. Role of CT scan discussed.   By history Kathy [name] has had a normal pelvic ultrasound. She would like to proceed with a hydrodistention. We will proceed accordingly.   After a thorough review of the management options for the patient's condition the patient  elected to proceed with surgical therapy as noted above. We have discussed the potential benefits and risks of the procedure, side effects of the proposed treatment, the likelihood of the patient achieving the goals of the procedure, and any potential problems that might occur during the procedure or recuperation. Informed consent has been obtained.

## 2014-02-25 ENCOUNTER — Encounter (HOSPITAL_BASED_OUTPATIENT_CLINIC_OR_DEPARTMENT_OTHER): Payer: Self-pay

## 2014-02-25 ENCOUNTER — Encounter (HOSPITAL_BASED_OUTPATIENT_CLINIC_OR_DEPARTMENT_OTHER)
Admission: RE | Disposition: A | Discharge status: Home / Self Care | Payer: Self-pay | Source: Ambulatory Visit | Attending: Urology

## 2014-02-25 ENCOUNTER — Ambulatory Visit (HOSPITAL_BASED_OUTPATIENT_CLINIC_OR_DEPARTMENT_OTHER): Payer: BC Managed Care – PPO | Admitting: Anesthesiology

## 2014-02-25 ENCOUNTER — Ambulatory Visit (HOSPITAL_BASED_OUTPATIENT_CLINIC_OR_DEPARTMENT_OTHER)
Admission: RE | Admit: 2014-02-25 | Discharge: 2014-02-25 | Disposition: A | Discharge status: Home / Self Care | Payer: BC Managed Care – PPO | Source: Ambulatory Visit | Attending: Urology | Admitting: Urology

## 2014-02-25 DIAGNOSIS — F319 Bipolar disorder, unspecified: Secondary | ICD-10-CM | POA: Diagnosis not present

## 2014-02-25 DIAGNOSIS — J45909 Unspecified asthma, uncomplicated: Secondary | ICD-10-CM | POA: Diagnosis not present

## 2014-02-25 DIAGNOSIS — F329 Major depressive disorder, single episode, unspecified: Secondary | ICD-10-CM | POA: Diagnosis not present

## 2014-02-25 DIAGNOSIS — N3010 Interstitial cystitis (chronic) without hematuria: Secondary | ICD-10-CM | POA: Diagnosis not present

## 2014-02-25 DIAGNOSIS — R102 Pelvic and perineal pain: Secondary | ICD-10-CM | POA: Diagnosis present

## 2014-02-25 DIAGNOSIS — F17210 Nicotine dependence, cigarettes, uncomplicated: Secondary | ICD-10-CM | POA: Diagnosis not present

## 2014-02-25 DIAGNOSIS — R109 Unspecified abdominal pain: Secondary | ICD-10-CM | POA: Insufficient documentation

## 2014-02-25 HISTORY — DX: Nocturia: R35.1

## 2014-02-25 HISTORY — DX: Personal history of suicidal behavior: Z91.51

## 2014-02-25 HISTORY — DX: Frequency of micturition: R35.0

## 2014-02-25 HISTORY — DX: Personal history of other diseases of the female genital tract: Z87.42

## 2014-02-25 HISTORY — DX: Pelvic and perineal pain: R10.2

## 2014-02-25 HISTORY — DX: Personal history of self-harm: Z91.5

## 2014-02-25 HISTORY — PX: CYSTO WITH HYDRODISTENSION: SHX5453

## 2014-02-25 HISTORY — DX: Dysthymic disorder: F34.1

## 2014-02-25 LAB — POCT PREGNANCY, URINE: Preg Test, Ur: NEGATIVE

## 2014-02-25 LAB — POCT HEMOGLOBIN-HEMACUE: Hemoglobin: 12.2 g/dL (ref 12.0–15.0)

## 2014-02-25 SURGERY — CYSTOSCOPY, WITH BLADDER HYDRODISTENSION
Anesthesia: General | Site: Bladder

## 2014-02-25 MED ORDER — ONDANSETRON HCL 4 MG/2ML IJ SOLN
INTRAMUSCULAR | Status: DC | PRN
  Administered 2014-02-25: 4 mg via INTRAVENOUS

## 2014-02-25 MED ORDER — KETOROLAC TROMETHAMINE 30 MG/ML IJ SOLN
15.0000 mg | Freq: Once | INTRAMUSCULAR | Status: DC | PRN
  Filled 2014-02-25: qty 1

## 2014-02-25 MED ORDER — PHENAZOPYRIDINE HCL 200 MG PO TABS
ORAL | Status: DC | PRN
  Administered 2014-02-25: 09:00:00 via INTRAVESICAL

## 2014-02-25 MED ORDER — LIDOCAINE HCL (CARDIAC) 20 MG/ML IV SOLN
INTRAVENOUS | Status: DC | PRN
  Administered 2014-02-25: 50 mg via INTRAVENOUS

## 2014-02-25 MED ORDER — GLYCOPYRROLATE 0.2 MG/ML IJ SOLN
INTRAMUSCULAR | Status: DC | PRN
  Administered 2014-02-25: 0.4 mg via INTRAVENOUS

## 2014-02-25 MED ORDER — FENTANYL CITRATE 0.05 MG/ML IJ SOLN
INTRAMUSCULAR | Status: AC
  Filled 2014-02-25: qty 4

## 2014-02-25 MED ORDER — LACTATED RINGERS IV SOLN
INTRAVENOUS | Status: DC
  Administered 2014-02-25: 09:00:00 via INTRAVENOUS
  Filled 2014-02-25: qty 1000

## 2014-02-25 MED ORDER — MIDAZOLAM HCL 2 MG/2ML IJ SOLN
INTRAMUSCULAR | Status: AC
  Filled 2014-02-25: qty 2

## 2014-02-25 MED ORDER — STERILE WATER FOR IRRIGATION IR SOLN
Status: DC | PRN
  Administered 2014-02-25: 3000 mL

## 2014-02-25 MED ORDER — FENTANYL CITRATE 0.05 MG/ML IJ SOLN
INTRAMUSCULAR | Status: DC | PRN
  Administered 2014-02-25: 25 ug via INTRAVENOUS
  Administered 2014-02-25: 50 ug via INTRAVENOUS

## 2014-02-25 MED ORDER — DEXAMETHASONE SODIUM PHOSPHATE 4 MG/ML IJ SOLN
INTRAMUSCULAR | Status: DC | PRN
  Administered 2014-02-25: 8 mg via INTRAVENOUS

## 2014-02-25 MED ORDER — HYDROCODONE-ACETAMINOPHEN 5-325 MG PO TABS: 1.0000 | ORAL_TABLET | Freq: Four times a day (QID) | ORAL | Status: DC | PRN

## 2014-02-25 MED ORDER — KETOROLAC TROMETHAMINE 30 MG/ML IJ SOLN
INTRAMUSCULAR | Status: DC | PRN
  Administered 2014-02-25: 30 mg via INTRAVENOUS

## 2014-02-25 MED ORDER — CIPROFLOXACIN HCL 250 MG PO TABS: 250.0000 mg | ORAL_TABLET | Freq: Two times a day (BID) | ORAL | Status: DC

## 2014-02-25 MED ORDER — FENTANYL CITRATE 0.05 MG/ML IJ SOLN
25.0000 ug | INTRAMUSCULAR | Status: DC | PRN
  Filled 2014-02-25: qty 1

## 2014-02-25 MED ORDER — PROMETHAZINE HCL 25 MG/ML IJ SOLN
6.2500 mg | INTRAMUSCULAR | Status: DC | PRN
  Filled 2014-02-25: qty 1

## 2014-02-25 MED ORDER — MIDAZOLAM HCL 5 MG/5ML IJ SOLN
INTRAMUSCULAR | Status: DC | PRN
  Administered 2014-02-25 (×2): 1 mg via INTRAVENOUS

## 2014-02-25 MED ORDER — BUPIVACAINE HCL 0.5 % IJ SOLN: INTRAMUSCULAR | Status: DC | PRN

## 2014-02-25 MED ORDER — CIPROFLOXACIN IN D5W 400 MG/200ML IV SOLN
400.0000 mg | INTRAVENOUS | Status: AC
  Administered 2014-02-25: 400 mg via INTRAVENOUS
  Filled 2014-02-25: qty 200

## 2014-02-25 MED ORDER — CIPROFLOXACIN IN D5W 400 MG/200ML IV SOLN
INTRAVENOUS | Status: AC
  Filled 2014-02-25: qty 200

## 2014-02-25 MED ORDER — PROPOFOL 10 MG/ML IV BOLUS
INTRAVENOUS | Status: DC | PRN
  Administered 2014-02-25: 200 mg via INTRAVENOUS

## 2014-02-25 SURGICAL SUPPLY — 26 items
BAG DRAIN URO-CYSTO SKYTR STRL (DRAIN) ×3 IMPLANT
BAG DRN UROCATH (DRAIN) ×1
CANISTER SUCT LVC 12 LTR MEDI- (MISCELLANEOUS) ×2 IMPLANT
CATH FOLEY 2WAY SLVR  5CC 18FR (CATHETERS)
CATH FOLEY 2WAY SLVR 5CC 18FR (CATHETERS) IMPLANT
CATH ROBINSON RED A/P 12FR (CATHETERS) IMPLANT
CATH ROBINSON RED A/P 14FR (CATHETERS) ×2 IMPLANT
CLOTH BEACON ORANGE TIMEOUT ST (SAFETY) ×3 IMPLANT
DRAPE CAMERA CLOSED 9X96 (DRAPES) ×3 IMPLANT
ELECT REM PT RETURN 9FT ADLT (ELECTROSURGICAL)
ELECTRODE REM PT RTRN 9FT ADLT (ELECTROSURGICAL) IMPLANT
GLOVE BIO SURGEON STRL SZ 6.5 (GLOVE) ×1 IMPLANT
GLOVE BIO SURGEON STRL SZ7.5 (GLOVE) ×3 IMPLANT
GLOVE BIO SURGEONS STRL SZ 6.5 (GLOVE) ×1
GLOVE INDICATOR 6.5 STRL GRN (GLOVE) ×4 IMPLANT
GOWN STRL REIN XL XLG (GOWN DISPOSABLE) ×1 IMPLANT
GOWN STRL REUS W/ TWL LRG LVL3 (GOWN DISPOSABLE) IMPLANT
GOWN STRL REUS W/ TWL XL LVL3 (GOWN DISPOSABLE) IMPLANT
GOWN STRL REUS W/TWL LRG LVL3 (GOWN DISPOSABLE) ×3
GOWN STRL REUS W/TWL XL LVL3 (GOWN DISPOSABLE) ×3
NDL SAFETY ECLIPSE 18X1.5 (NEEDLE) ×1 IMPLANT
NEEDLE HYPO 18GX1.5 SHARP (NEEDLE) ×3
PACK CYSTO (CUSTOM PROCEDURE TRAY) ×3 IMPLANT
SUT SILK 0 TIES 10X30 (SUTURE) IMPLANT
SYR 20CC LL (SYRINGE) ×3 IMPLANT
WATER STERILE IRR 3000ML UROMA (IV SOLUTION) ×3 IMPLANT

## 2014-02-25 NOTE — Transfer of Care (Signed)
Immediate Anesthesia Transfer of Care Note  Patient: Kathy [name]  Procedure(s) Performed: Procedure(s) (LRB): CYSTOSCOPY/HYDRODISTENSION WITH INSTILLATION (N/A)  Patient Location: PACU  Anesthesia Type: General  Level of Consciousness: awake, oriented, sedated and patient cooperative  Airway & Oxygen Therapy: Patient Spontanous Breathing and Patient connected to face mask oxygen  Post-op Assessment: Report given to PACU RN and Post -op Vital signs reviewed and stable  Post vital signs: Reviewed and stable  Complications: No apparent anesthesia complications

## 2014-02-25 NOTE — Discharge Instructions (Signed)
I have reviewed discharge instructions in detail with the patient. They will follow-up with me or their physician as scheduled. My nurse will also be calling the patients as per protocol.  ° ° °CYSTOSCOPY HOME CARE INSTRUCTIONS ° °Activity: °Rest for the remainder of the day.  Do not drive or operate equipment today.  You may resume normal activities in one to two days as instructed by your physician.  ° °Meals: °Drink plenty of liquids and eat light foods such as gelatin or soup this evening.  You may return to a normal meal plan tomorrow. ° °Return to Work: °You may return to work in one to two days or as instructed by your physician. ° °Special Instructions / Symptoms: °Call your physician if any of these symptoms occur: ° ° -persistent or heavy bleeding ° -bleeding which continues after first few urination ° -large blood clots that are difficult to pass ° -urine stream diminishes or stops completely ° -fever equal to or higher than 101 degrees Farenheit. ° -cloudy urine with a strong, foul odor ° -severe pain ° °Females should always wipe from front to back after elimination.  You may feel some burning pain when you urinate.  This should disappear with time.  Applying moist heat to the lower abdomen or a hot tub bath may help relieve the pain. \ ° ° ° ° °Post Anesthesia Home Care Instructions ° °Activity: °Get plenty of rest for the remainder of the day. A responsible adult should stay with you for 24 hours following the procedure.  °For the next 24 hours, DO NOT: °-Drive a car °-Operate machinery °-Drink alcoholic beverages °-Take any medication unless instructed by your physician °-Make any legal decisions or sign important papers. ° °Meals: °Start with liquid foods such as gelatin or soup. Progress to regular foods as tolerated. Avoid greasy, spicy, heavy foods. If nausea and/or vomiting occur, drink only clear liquids until the nausea and/or vomiting subsides. Call your physician if vomiting  continues. ° °Special Instructions/Symptoms: °Your throat may feel dry or sore from the anesthesia or the breathing tube placed in your throat during surgery. If this causes discomfort, gargle with warm salt water. The discomfort should disappear within 24 hours. ° ° °

## 2014-02-25 NOTE — Anesthesia Procedure Notes (Signed)
Procedure Name: LMA Insertion Date/Time: 02/25/2014 9:06 AM Performed by: Renella CunasHAZEL, Zephyra Bernardi D Pre-anesthesia Checklist: Patient identified, Emergency Drugs available, Suction available and Patient being monitored Patient Re-evaluated:Patient Re-evaluated prior to inductionOxygen Delivery Method: Circle System Utilized Preoxygenation: Pre-oxygenation with 100% oxygen Intubation Type: IV induction Ventilation: Mask ventilation without difficulty LMA: LMA inserted LMA Size: 4.0 Number of attempts: 1 Airway Equipment and Method: bite block Placement Confirmation: positive ETCO2 Tube secured with: Tape Dental Injury: Teeth and Oropharynx as per pre-operative assessment

## 2014-02-25 NOTE — Anesthesia Postprocedure Evaluation (Signed)
  Anesthesia Post-op Note  Patient: Kathy [name]  Procedure(s) Performed: Procedure(s) (LRB): CYSTOSCOPY/HYDRODISTENSION WITH INSTILLATION (N/A)  Patient Location: PACU  Anesthesia Type: General  Level of Consciousness: awake and alert   Airway and Oxygen Therapy: Patient Spontanous Breathing  Post-op Pain: mild  Post-op Assessment: Post-op Vital signs reviewed, Patient's Cardiovascular Status Stable, Respiratory Function Stable, Patent Airway and No signs of Nausea or vomiting  Last Vitals:  Filed Vitals:   02/25/14 1000  BP: 105/66  Pulse: 83  Temp:   Resp: 19    Post-op Vital Signs: stable   Complications: No apparent anesthesia complications

## 2014-02-25 NOTE — Anesthesia Preprocedure Evaluation (Signed)
Anesthesia Evaluation  Patient identified by MRN, date of birth, ID band Patient awake    Reviewed: Allergy & Precautions, H&P , NPO status , Patient's Chart, lab work & pertinent test results  Airway Mallampati: II  TM Distance: >3 FB Neck ROM: Full    Dental no notable dental hx.    Pulmonary asthma , Current Smoker,  breath sounds clear to auscultation  Pulmonary exam normal       Cardiovascular negative cardio ROS  Rhythm:Regular Rate:Normal     Neuro/Psych Bipolar Disorder negative neurological ROS     GI/Hepatic negative GI ROS, Neg liver ROS,   Endo/Other  negative endocrine ROS  Renal/GU negative Renal ROS  negative genitourinary   Musculoskeletal negative musculoskeletal ROS (+)   Abdominal   Peds negative pediatric ROS (+)  Hematology negative hematology ROS (+)   Anesthesia Other Findings   Reproductive/Obstetrics negative OB ROS                             Anesthesia Physical Anesthesia Plan  ASA: II  Anesthesia Plan: General   Post-op Pain Management:    Induction: Intravenous  Airway Management Planned: LMA  Additional Equipment:   Intra-op Plan:   Post-operative Plan: Extubation in OR  Informed Consent: I have reviewed the patients History and Physical, chart, labs and discussed the procedure including the risks, benefits and alternatives for the proposed anesthesia with the patient or authorized representative who has indicated his/her understanding and acceptance.   Dental advisory given  Plan Discussed with: CRNA and Surgeon  Anesthesia Plan Comments:         Anesthesia Quick Evaluation

## 2014-02-25 NOTE — Interval H&P Note (Signed)
History and Physical Interval Note:  02/25/2014 7:56 AM  Kathy [name]  has presented today for surgery, with the diagnosis of PELVIC PAIN  The various methods of treatment have been discussed with the patient and family. After consideration of risks, benefits and other options for treatment, the patient has consented to  Procedure(s): CYSTOSCOPY/HYDRODISTENSION WITH INSTILLATION (N/A) as a surgical intervention .  The patient's history has been reviewed, patient examined, no change in status, stable for surgery.  I have reviewed the patient's chart and labs.  Questions were answered to the patient's satisfaction.     Charles Andringa A   

## 2014-02-25 NOTE — Op Note (Signed)
Preoperative diagnosis: Pelvic pain Postoperative diagnosis: Interstitial cystitis Surgery: Cystoscopy and bladder hydrodistention and bladder installation therapy Surgeon: Dr. Lorin PicketScott Easten Maceachern  The patient has the above diagnoses and consented the above procedure. Preoperative antibiotics were given. 21 French cystoscope was utilized. Bladder mucosa and trigone were normal. The bladder was hydrodistended to 550 mL.  The bladder was emptied and re-examined. She diffuse glomerulations. I lightly irrigated her bladder with my usual technique. There is no bladder injury.  With a separate procedure and a red rubber catheter I instilled 15 mL of 0.5% Marcaine and 400 mg of peridium  The patient has findings in keeping with a diagnosis of interstitial cystitis

## 2014-02-25 NOTE — Interval H&P Note (Signed)
History and Physical Interval Note:  02/25/2014 7:56 AM  Kathy [name]  has presented today for surgery, with the diagnosis of PELVIC PAIN  The various methods of treatment have been discussed with the patient and family. After consideration of risks, benefits and other options for treatment, the patient has consented to  Procedure(s): CYSTOSCOPY/HYDRODISTENSION WITH INSTILLATION (N/A) as a surgical intervention .  The patient's history has been reviewed, patient examined, no change in status, stable for surgery.  I have reviewed the patient's chart and labs.  Questions were answered to the patient's satisfaction.     [name]

## 2014-02-26 ENCOUNTER — Encounter (HOSPITAL_BASED_OUTPATIENT_CLINIC_OR_DEPARTMENT_OTHER): Payer: Self-pay | Admitting: Urology

## 2014-02-28 ENCOUNTER — Other Ambulatory Visit (HOSPITAL_COMMUNITY): Payer: Self-pay | Admitting: Otolaryngology

## 2014-02-28 DIAGNOSIS — R1310 Dysphagia, unspecified: Secondary | ICD-10-CM

## 2014-03-05 ENCOUNTER — Other Ambulatory Visit (HOSPITAL_COMMUNITY): Payer: Self-pay | Admitting: Otolaryngology

## 2014-03-05 DIAGNOSIS — R1310 Dysphagia, unspecified: Secondary | ICD-10-CM

## 2014-03-12 ENCOUNTER — Ambulatory Visit (HOSPITAL_COMMUNITY): Payer: BC Managed Care – PPO

## 2014-04-10 ENCOUNTER — Inpatient Hospital Stay (HOSPITAL_COMMUNITY): Payer: BLUE CROSS/BLUE SHIELD

## 2014-04-10 ENCOUNTER — Encounter (HOSPITAL_COMMUNITY): Payer: Self-pay | Admitting: *Deleted

## 2014-04-10 ENCOUNTER — Inpatient Hospital Stay (HOSPITAL_COMMUNITY)
Admission: AD | Admit: 2014-04-10 | Discharge: 2014-04-10 | Disposition: A | Discharge status: Home / Self Care | Payer: BLUE CROSS/BLUE SHIELD | Source: Ambulatory Visit | Attending: Obstetrics and Gynecology | Admitting: Obstetrics and Gynecology

## 2014-04-10 DIAGNOSIS — N390 Urinary tract infection, site not specified: Secondary | ICD-10-CM | POA: Diagnosis not present

## 2014-04-10 DIAGNOSIS — Z87891 Personal history of nicotine dependence: Secondary | ICD-10-CM | POA: Insufficient documentation

## 2014-04-10 DIAGNOSIS — R1031 Right lower quadrant pain: Secondary | ICD-10-CM

## 2014-04-10 DIAGNOSIS — R109 Unspecified abdominal pain: Secondary | ICD-10-CM | POA: Diagnosis present

## 2014-04-10 HISTORY — DX: Interstitial cystitis (chronic) without hematuria: N30.10

## 2014-04-10 LAB — CBC WITH DIFFERENTIAL/PLATELET
BASOS PCT: 0 % (ref 0–1)
Basophils Absolute: 0 10*3/uL (ref 0.0–0.1)
EOS ABS: 0.2 10*3/uL (ref 0.0–0.7)
Eosinophils Relative: 2 % (ref 0–5)
HEMATOCRIT: 39.3 % (ref 36.0–46.0)
HEMOGLOBIN: 12.7 g/dL (ref 12.0–15.0)
LYMPHS PCT: 19 % (ref 12–46)
Lymphs Abs: 2 10*3/uL (ref 0.7–4.0)
MCH: 27.8 pg (ref 26.0–34.0)
MCHC: 32.3 g/dL (ref 30.0–36.0)
MCV: 86 fL (ref 78.0–100.0)
MONOS PCT: 7 % (ref 3–12)
Monocytes Absolute: 0.8 10*3/uL (ref 0.1–1.0)
Neutro Abs: 7.6 10*3/uL (ref 1.7–7.7)
Neutrophils Relative %: 72 % (ref 43–77)
PLATELETS: 239 10*3/uL (ref 150–400)
RBC: 4.57 MIL/uL (ref 3.87–5.11)
RDW: 12.7 % (ref 11.5–15.5)
WBC: 10.6 10*3/uL — ABNORMAL HIGH (ref 4.0–10.5)

## 2014-04-10 LAB — URINALYSIS, ROUTINE W REFLEX MICROSCOPIC
Bilirubin Urine: NEGATIVE
GLUCOSE, UA: NEGATIVE mg/dL
Hgb urine dipstick: NEGATIVE
Ketones, ur: NEGATIVE mg/dL
NITRITE: NEGATIVE
PH: 6 (ref 5.0–8.0)
PROTEIN: NEGATIVE mg/dL
SPECIFIC GRAVITY, URINE: 1.015 (ref 1.005–1.030)
Urobilinogen, UA: 0.2 mg/dL (ref 0.0–1.0)

## 2014-04-10 LAB — URINE MICROSCOPIC-ADD ON

## 2014-04-10 LAB — HCG, QUANTITATIVE, PREGNANCY: hCG, Beta Chain, Quant, S: <1 m[IU]/mL (ref <5)

## 2014-04-10 LAB — POCT PREGNANCY, URINE: Preg Test, Ur: NEGATIVE

## 2014-04-10 MED ORDER — KETOROLAC TROMETHAMINE 60 MG/2ML IM SOLN
60.0000 mg | Freq: Once | INTRAMUSCULAR | Status: AC
  Administered 2014-04-10: 60 mg via INTRAMUSCULAR
  Filled 2014-04-10: qty 2

## 2014-04-10 MED ORDER — CIPROFLOXACIN HCL 500 MG PO TABS: 500.0000 mg | ORAL_TABLET | Freq: Two times a day (BID) | ORAL | Status: DC

## 2014-04-10 NOTE — MAU Note (Signed)
Rt lower quad pain, started about a week ago, has gotten progressively worse. Is now a sharp stabbing pain. Faint positive preg test yesterday.  Had taken plan B

## 2014-04-10 NOTE — Discharge Instructions (Signed)
Abdominal Pain, Women °Abdominal (stomach, pelvic, or belly) pain can be caused by many things. It is important to tell your doctor: °· The location of the pain. °· Does it come and go or is it present all the time? °· Are there things that start the pain (eating certain foods, exercise)? °· Are there other symptoms associated with the pain (fever, nausea, vomiting, diarrhea)? °All of this is helpful to know when trying to find the cause of the pain. °CAUSES  °· Stomach: virus or bacteria infection, or ulcer. °· Intestine: appendicitis (inflamed appendix), regional ileitis (Crohn's disease), ulcerative colitis (inflamed colon), irritable bowel syndrome, diverticulitis (inflamed diverticulum of the colon), or cancer of the stomach or intestine. °· Gallbladder disease or stones in the gallbladder. °· Kidney disease, kidney stones, or infection. °· Pancreas infection or cancer. °· Fibromyalgia (pain disorder). °· Diseases of the female organs: °· Uterus: fibroid (non-cancerous) tumors or infection. °· Fallopian tubes: infection or tubal pregnancy. °· Ovary: cysts or tumors. °· Pelvic adhesions (scar tissue). °· Endometriosis (uterus lining tissue growing in the pelvis and on the pelvic organs). °· Pelvic congestion syndrome (female organs filling up with blood just before the menstrual period). °· Pain with the menstrual period. °· Pain with ovulation (producing an egg). °· Pain with an IUD (intrauterine device, birth control) in the uterus. °· Cancer of the female organs. °· Functional pain (pain not caused by a disease, may improve without treatment). °· Psychological pain. °· Depression. °DIAGNOSIS  °Your doctor will decide the seriousness of your pain by doing an examination. °· Blood tests. °· X-rays. °· Ultrasound. °· CT scan (computed tomography, special type of X-ray). °· MRI (magnetic resonance imaging). °· Cultures, for infection. °· Barium enema (dye inserted in the large intestine, to better view it with  X-rays). °· Colonoscopy (looking in intestine with a lighted tube). °· Laparoscopy (minor surgery, looking in abdomen with a lighted tube). °· Major abdominal exploratory surgery (looking in abdomen with a large incision). °TREATMENT  °The treatment will depend on the cause of the pain.  °· Many cases can be observed and treated at home. °· Over-the-counter medicines recommended by your caregiver. °· Prescription medicine. °· Antibiotics, for infection. °· Birth control pills, for painful periods or for ovulation pain. °· Hormone treatment, for endometriosis. °· Nerve blocking injections. °· Physical therapy. °· Antidepressants. °· Counseling with a psychologist or psychiatrist. °· Minor or major surgery. °HOME CARE INSTRUCTIONS  °· Do not take laxatives, unless directed by your caregiver. °· Take over-the-counter pain medicine only if ordered by your caregiver. Do not take aspirin because it can cause an upset stomach or bleeding. °· Try a clear liquid diet (broth or water) as ordered by your caregiver. Slowly move to a bland diet, as tolerated, if the pain is related to the stomach or intestine. °· Have a thermometer and take your temperature several times a day, and record it. °· Bed rest and sleep, if it helps the pain. °· Avoid sexual intercourse, if it causes pain. °· Avoid stressful situations. °· Keep your follow-up appointments and tests, as your caregiver orders. °· If the pain does not go away with medicine or surgery, you may try: °¨ Acupuncture. °¨ Relaxation exercises (yoga, meditation). °¨ Group therapy. °¨ Counseling. °SEEK MEDICAL CARE IF:  °· You notice certain foods cause stomach pain. °· Your home care treatment is not helping your pain. °· You need stronger pain medicine. °· You want your IUD removed. °· You feel faint or   lightheaded. °· You develop nausea and vomiting. °· You develop a rash. °· You are having side effects or an allergy to your medicine. °SEEK IMMEDIATE MEDICAL CARE IF:  °· Your  pain does not go away or gets worse. °· You have a fever. °· Your pain is felt only in portions of the abdomen. The right side could possibly be appendicitis. The left lower portion of the abdomen could be colitis or diverticulitis. °· You are passing blood in your stools (bright red or black tarry stools, with or without vomiting). °· You have blood in your urine. °· You develop chills, with or without a fever. °· You pass out. °MAKE SURE YOU:  °· Understand these instructions. °· Will watch your condition. °· Will get help right away if you are not doing well or get worse. °Document Released: 01/09/2007 Document Revised: 07/29/2013 Document Reviewed: 01/29/2009 °[employer]® Patient Information ©2015 [employer]. This information is not intended to replace advice given to you by your health care provider. Make sure you discuss any questions you have with your health care provider. ° °Urinary Tract Infection °Urinary tract infections (UTIs) can develop anywhere along your urinary tract. Your urinary tract is your body's drainage system for removing wastes and extra water. Your urinary tract includes two kidneys, two ureters, a bladder, and a urethra. Your kidneys are a pair of bean-shaped organs. Each kidney is about the size of your fist. They are located below your ribs, one on each side of your spine. °CAUSES °Infections are caused by microbes, which are microscopic organisms, including fungi, viruses, and bacteria. These organisms are so small that they can only be seen through a microscope. Bacteria are the microbes that most commonly cause UTIs. °SYMPTOMS  °Symptoms of UTIs may vary by age and gender of the patient and by the location of the infection. Symptoms in young women typically include a frequent and intense urge to urinate and a painful, burning feeling in the bladder or urethra during urination. Older women and men are more likely to be tired, shaky, and weak and have muscle aches and abdominal pain.  A fever may mean the infection is in your kidneys. Other symptoms of a kidney infection include pain in your back or sides below the ribs, nausea, and vomiting. °DIAGNOSIS °To diagnose a UTI, your caregiver will ask you about your symptoms. Your caregiver also will ask to provide a urine sample. The urine sample will be tested for bacteria and white blood cells. White blood cells are made by your body to help fight infection. °TREATMENT  °Typically, UTIs can be treated with medication. Because most UTIs are caused by a bacterial infection, they usually can be treated with the use of antibiotics. The choice of antibiotic and length of treatment depend on your symptoms and the type of bacteria causing your infection. °HOME CARE INSTRUCTIONS °· If you were prescribed antibiotics, take them exactly as your caregiver instructs you. Finish the medication even if you feel better after you have only taken some of the medication. °· Drink enough water and fluids to keep your urine clear or pale yellow. °· Avoid caffeine, tea, and carbonated beverages. They tend to irritate your bladder. °· Empty your bladder often. Avoid holding urine for long periods of time. °· Empty your bladder before and after sexual intercourse. °· After a bowel movement, women should cleanse from front to back. Use each tissue only once. °SEEK MEDICAL CARE IF:  °· You have back pain. °· You develop   a fever. °· Your symptoms do not begin to resolve within 3 days. °SEEK IMMEDIATE MEDICAL CARE IF:  °· You have severe back pain or lower abdominal pain. °· You develop chills. °· You have nausea or vomiting. °· You have continued burning or discomfort with urination. °MAKE SURE YOU:  °· Understand these instructions. °· Will watch your condition. °· Will get help right away if you are not doing well or get worse. °Document Released: 12/22/2004 Document Revised: 09/13/2011 Document Reviewed: 04/22/2011 °[employer]® Patient Information ©2015 [employer]. This  information is not intended to replace advice given to you by your health care provider. Make sure you discuss any questions you have with your health care provider. ° °

## 2014-04-10 NOTE — MAU Provider Note (Signed)
History     CSN: [id number]  Arrival date and time: 04/10/14 4098   First Provider Initiated Contact with Patient 04/10/14 [id number]      Chief Complaint  Patient presents with  . Abdominal Pain  . Possible Pregnancy   HPI Kathy [name] 24 y.o. [id number]  presents to MAU complaining of right ovary pain x 1 week.  She had pain intermittently at first but now it is constant, stabbing, 9/10.  She hasn't slept the last 2 nights due to pain.  She has been using ibuprofen to help with pain and notes minimal relief.  She had a period started 12/13 and had sex that day.  On 12/15 she used Plan B.  Her period was irregular during that time.  She had a faintly positive pregnancy test last night.  She does not want to be pregnant.  She had Nexplanon removed 2 months ago.  She denies vaginal bleeding, dysuria, vomiting.  She has weakness, headache, nausea.  OB History    Gravida Para Term Preterm AB TAB SAB Ectopic Multiple Living   0 1 0 1 0 0 2      Past Medical History  Diagnosis Date  . Orthostatic hypotension   . Asthma   . Depression   . Bipolar 1 disorder   . Headache(784.0)   . HSV-1 (herpes simplex virus 1) infection   . History of pelvic inflammatory disease   . History of abnormal cervical Pap smear   . Dysthymic disorder   . History of suicide attempt     06/ 2012  overdose oxycontin  . Pelvic pain in female   . History of ovarian cyst   . Frequency of urination   . Nocturia   . Cystitis, interstitial     Past Surgical History  Procedure Laterality Date  . Tonsillectomy  09-10-2007  . Dilation and curettage of uterus  2011    W/ SUCTION  . Cysto with hydrodistension N/A 02/25/2014    Procedure: CYSTOSCOPY/HYDRODISTENSION WITH INSTILLATION;  Surgeon: Martina Sinner, MD;  Location: [facility]  Service: Urology;  Laterality: N/A;    Family History  Problem Relation Age of Onset  . Adopted: Yes  . Anesthesia problems Neg Hx   .  Breast cancer Maternal Grandmother     History  Substance Use Topics  . Smoking status: Former Smoker -- 0.25 packs/day for .3 years    Types: Cigarettes    Quit date: 03/28/2014  . Smokeless tobacco: Never Used     Comment: 3 CIG PER DAY SINCE JULY 2015  . Alcohol Use: Yes     Comment: rare    Allergies:  Allergies  Allergen Reactions  . Diflucan [Fluconazole] Itching    Prescriptions prior to admission  Medication Sig Dispense Refill Last Dose  . ALBUTEROL IN Inhale into the lungs as needed.   Past Week at Unknown time  . butalbital-acetaminophen-caffeine (FIORICET, ESGIC) 50-325-40 MG per tablet Take 1 tablet by mouth every 4 (four) hours as needed for headache.     . ciprofloxacin (CIPRO) 250 MG tablet Take 1 tablet (250 mg total) by mouth 2 (two) times daily. 6 tablet 0   . HYDROcodone-acetaminophen (NORCO) 5-325 MG per tablet Take 1 tablet by mouth every 6 (six) hours as needed for moderate pain. 30 tablet 0     ROS Pertinent ROS in HPI Physical Exam   Height 5' 3.5" (1.613 m), weight 176 lb (79.833 kg), last  menstrual period 03/09/2014.  Physical Exam  Constitutional: She is oriented to person, place, and time. She appears well-developed and well-nourished. No distress.  HENT:  Head: Normocephalic and atraumatic.  Eyes: EOM are normal.  Neck: Normal range of motion.  Cardiovascular: Normal rate and regular rhythm.   Respiratory: Effort normal and breath sounds normal. No respiratory distress.  GI: Soft. Bowel sounds are normal. She exhibits no distension. There is tenderness. There is no rebound and no guarding.  RLQ tenderness to palpation  Genitourinary:  Small amt of thin homogenous discharge No CMT/no adnexal mass appreciated.  Tender to palpation in R adnexa  Musculoskeletal: Normal range of motion.  Neurological: She is alert and oriented to person, place, and time.  Skin: Skin is warm and dry.  Psychiatric: She has a normal mood and affect.   the  patient declines STD testing  Results for orders placed or performed during the hospital encounter of 04/10/14 (from the past 24 hour(s))  Urinalysis, Routine w reflex microscopic     Status: Abnormal   Collection Time: 04/10/14  7:40 AM  Result Value Ref Range   Color, Urine YELLOW YELLOW   APPearance CLEAR CLEAR   Specific Gravity, Urine 1.015 1.005 - 1.030   pH 6.0 5.0 - 8.0   Glucose, UA NEGATIVE NEGATIVE mg/dL   Hgb urine dipstick NEGATIVE NEGATIVE   Bilirubin Urine NEGATIVE NEGATIVE   Ketones, ur NEGATIVE NEGATIVE mg/dL   Protein, ur NEGATIVE NEGATIVE mg/dL   Urobilinogen, UA 0.2 0.0 - 1.0 mg/dL   Nitrite NEGATIVE NEGATIVE   Leukocytes, UA MODERATE (A) NEGATIVE  Urine microscopic-add on     Status: Abnormal   Collection Time: 04/10/14  7:40 AM  Result Value Ref Range   Squamous Epithelial / LPF FEW (A) RARE   WBC, UA 3-6 <3 WBC/hpf   Bacteria, UA RARE RARE  Pregnancy, urine POC     Status: None   Collection Time: 04/10/14  7:48 AM  Result Value Ref Range   Preg Test, Ur NEGATIVE NEGATIVE  hCG, quantitative, pregnancy     Status: None   Collection Time: 04/10/14  8:09 AM  Result Value Ref Range   hCG, Beta Chain, Quant, S <1 <5 mIU/mL  CBC with Differential     Status: Abnormal   Collection Time: 04/10/14  8:09 AM  Result Value Ref Range   WBC 10.6 (H) 4.0 - 10.5 K/uL   RBC 4.57 3.87 - 5.11 MIL/uL   Hemoglobin 12.7 12.0 - 15.0 g/dL   HCT 16.1 09.6 - 04.5 %   MCV 86.0 78.0 - 100.0 fL   MCH 27.8 26.0 - 34.0 pg   MCHC 32.3 30.0 - 36.0 g/dL   RDW 40.9 81.1 - 91.4 %   Platelets 239 150 - 400 K/uL   Neutrophils Relative % 72 43 - 77 %   Neutro Abs 7.6 1.7 - 7.7 K/uL   Lymphocytes Relative 19 12 - 46 %   Lymphs Abs 2.0 0.7 - 4.0 K/uL   Monocytes Relative 7 3 - 12 %   Monocytes Absolute 0.8 0.1 - 1.0 K/uL   Eosinophils Relative 2 0 - 5 %   Eosinophils Absolute 0.2 0.0 - 0.7 K/uL   Basophils Relative 0 0 - 1 %   Basophils Absolute 0.0 0.0 - 0.1 K/uL   US  Transvaginal Non-ob  04/10/2014   CLINICAL DATA:  Severe right lower quadrant pain for 1 week.  EXAM: TRANSABDOMINAL AND TRANSVAGINAL ULTRASOUND OF PELVIS  TECHNIQUE:  Both transabdominal and transvaginal ultrasound examinations of the pelvis were performed. Transabdominal technique was performed for global imaging of the pelvis including uterus, ovaries, adnexal regions, and pelvic cul-de-sac. It was necessary to proceed with endovaginal exam following the transabdominal exam to visualize the endometrium.  COMPARISON:  Pelvic ultrasound 01/09/2014  FINDINGS: Uterus  Measurements: 8.8 x 4.5 x 5.2 cm, within normal limits. No fibroids or other mass visualized.  Endometrium  Thickness: 5.6 mm, within normal limits. No focal abnormality visualized.  Right ovary  Measurements: 2.5 x 1.7 x 1.7 cm, within normal limits. Normal appearance/no adnexal mass.  Left ovary  Measurements: 3.2 x 1.8 x 2.6 cm, within normal limits. Normal appearance/no adnexal mass.  Other findings  A trace amount of free fluid is likely physiologic  IMPRESSION: Normal pelvic ultrasound without acute or focal lesion to explain the patient's pain.   Electronically Signed   By: Gennette Pachris  Mattern M.D.   On: 04/10/2014 11:44   [place] Pelvis Complete  04/10/2014   CLINICAL DATA:  Severe right lower quadrant pain for 1 week.  EXAM: TRANSABDOMINAL AND TRANSVAGINAL ULTRASOUND OF PELVIS  TECHNIQUE: Both transabdominal and transvaginal ultrasound examinations of the pelvis were performed. Transabdominal technique was performed for global imaging of the pelvis including uterus, ovaries, adnexal regions, and pelvic cul-de-sac. It was necessary to proceed with endovaginal exam following the transabdominal exam to visualize the endometrium.  COMPARISON:  Pelvic ultrasound 01/09/2014  FINDINGS: Uterus  Measurements: 8.8 x 4.5 x 5.2 cm, within normal limits. No fibroids or other mass visualized.  Endometrium  Thickness: 5.6 mm, within normal limits. No focal  abnormality visualized.  Right ovary  Measurements: 2.5 x 1.7 x 1.7 cm, within normal limits. Normal appearance/no adnexal mass.  Left ovary  Measurements: 3.2 x 1.8 x 2.6 cm, within normal limits. Normal appearance/no adnexal mass.  Other findings  A trace amount of free fluid is likely physiologic  IMPRESSION: Normal pelvic ultrasound without acute or focal lesion to explain the patient's pain.   Electronically Signed   By: Gennette Pachris  Mattern M.D.   On: 04/10/2014 11:44    MAU Course  Procedures  MDM Pregnancy ruled out with Quant HCG <1 Toradol 60mg  IM given.  Pain improved from 9/10 to 4/10. U/S negative CBC with slightly elevated WBC at 10.6 and U/A with mod leuks.  Discussed with Dr. Rana SnareLowe.  He is in agreement to discharge pt to home with rx for Cipro for presumed UTI and pyridium.  He advises for pt to return to clinic in 1 week for followup. Pt is agreeable to this plan.  She notes she has active rx for pyridium at home that Urologist has provided for PRN use.  She will use this.  She notes good response to Cipro in past and no recent use.   Assessment and Plan  A: Clinical UTI  P:  Discharge to home Urine Culture pending Pyridium PRN (pt has rx) Cipro bid x 5 days Follow up with Dr. Rana SnareLowe in clinic in 1 week Patient may return to MAU as needed or if her condition were to change or worsen   Bertram Denvereague Clark, Gasper Hopes E 04/10/2014, 8:15 AM

## 2014-04-12 LAB — URINE CULTURE
COLONY COUNT: NO GROWTH
Culture: NO GROWTH

## 2014-07-23 ENCOUNTER — Emergency Department (HOSPITAL_COMMUNITY)
Admission: EM | Admit: 2014-07-23 | Discharge: 2014-07-23 | Disposition: A | Discharge status: Home / Self Care | Payer: BLUE CROSS/BLUE SHIELD | Attending: Emergency Medicine | Admitting: Emergency Medicine

## 2014-07-23 ENCOUNTER — Encounter (HOSPITAL_COMMUNITY): Payer: Self-pay | Admitting: Emergency Medicine

## 2014-07-23 DIAGNOSIS — Z8679 Personal history of other diseases of the circulatory system: Secondary | ICD-10-CM | POA: Insufficient documentation

## 2014-07-23 DIAGNOSIS — Z87891 Personal history of nicotine dependence: Secondary | ICD-10-CM | POA: Insufficient documentation

## 2014-07-23 DIAGNOSIS — J45909 Unspecified asthma, uncomplicated: Secondary | ICD-10-CM | POA: Diagnosis not present

## 2014-07-23 DIAGNOSIS — Z8659 Personal history of other mental and behavioral disorders: Secondary | ICD-10-CM | POA: Insufficient documentation

## 2014-07-23 DIAGNOSIS — Z8619 Personal history of other infectious and parasitic diseases: Secondary | ICD-10-CM | POA: Insufficient documentation

## 2014-07-23 DIAGNOSIS — R10811 Right upper quadrant abdominal tenderness: Secondary | ICD-10-CM

## 2014-07-23 DIAGNOSIS — Z87448 Personal history of other diseases of urinary system: Secondary | ICD-10-CM | POA: Insufficient documentation

## 2014-07-23 DIAGNOSIS — R1011 Right upper quadrant pain: Secondary | ICD-10-CM | POA: Insufficient documentation

## 2014-07-23 DIAGNOSIS — Z79899 Other long term (current) drug therapy: Secondary | ICD-10-CM | POA: Insufficient documentation

## 2014-07-23 DIAGNOSIS — Z8742 Personal history of other diseases of the female genital tract: Secondary | ICD-10-CM | POA: Diagnosis not present

## 2014-07-23 DIAGNOSIS — Z3202 Encounter for pregnancy test, result negative: Secondary | ICD-10-CM | POA: Diagnosis not present

## 2014-07-23 LAB — URINALYSIS, ROUTINE W REFLEX MICROSCOPIC
Bilirubin Urine: NEGATIVE
Glucose, UA: NEGATIVE mg/dL
Hgb urine dipstick: NEGATIVE
Ketones, ur: NEGATIVE mg/dL
NITRITE: NEGATIVE
Protein, ur: NEGATIVE mg/dL
SPECIFIC GRAVITY, URINE: 1.01 (ref 1.005–1.030)
UROBILINOGEN UA: 0.2 mg/dL (ref 0.0–1.0)
pH: 7.5 (ref 5.0–8.0)

## 2014-07-23 LAB — CBC WITH DIFFERENTIAL/PLATELET
BASOS ABS: 0 10*3/uL (ref 0.0–0.1)
BASOS PCT: 1 % (ref 0–1)
EOS ABS: 0.1 10*3/uL (ref 0.0–0.7)
Eosinophils Relative: 2 % (ref 0–5)
HCT: 41.1 % (ref 36.0–46.0)
HEMOGLOBIN: 12.6 g/dL (ref 12.0–15.0)
Lymphocytes Relative: 38 % (ref 12–46)
Lymphs Abs: 2 10*3/uL (ref 0.7–4.0)
MCH: 26.6 pg (ref 26.0–34.0)
MCHC: 30.7 g/dL (ref 30.0–36.0)
MCV: 86.9 fL (ref 78.0–100.0)
MONOS PCT: 8 % (ref 3–12)
Monocytes Absolute: 0.4 10*3/uL (ref 0.1–1.0)
NEUTROS ABS: 2.7 10*3/uL (ref 1.7–7.7)
NEUTROS PCT: 51 % (ref 43–77)
Platelets: 267 10*3/uL (ref 150–400)
RBC: 4.73 MIL/uL (ref 3.87–5.11)
RDW: 12.4 % (ref 11.5–15.5)
WBC: 5.2 10*3/uL (ref 4.0–10.5)

## 2014-07-23 LAB — COMPREHENSIVE METABOLIC PANEL
ALBUMIN: 4 g/dL (ref 3.5–5.2)
ALT: 19 U/L (ref 0–35)
ANION GAP: 9 (ref 5–15)
AST: 25 U/L (ref 0–37)
Alkaline Phosphatase: 52 U/L (ref 39–117)
BUN: 19 mg/dL (ref 6–23)
CO2: 29 mmol/L (ref 19–32)
CREATININE: 0.72 mg/dL (ref 0.50–1.10)
Calcium: 9.3 mg/dL (ref 8.4–10.5)
Chloride: 105 mmol/L (ref 96–112)
GFR calc non Af Amer: >90 mL/min (ref >90)
GLUCOSE: 92 mg/dL (ref 70–99)
Potassium: 4 mmol/L (ref 3.5–5.1)
Sodium: 143 mmol/L (ref 135–145)
TOTAL PROTEIN: 7.1 g/dL (ref 6.0–8.3)
Total Bilirubin: 0.9 mg/dL (ref 0.3–1.2)

## 2014-07-23 LAB — LIPASE, BLOOD: Lipase: 22 U/L (ref 11–59)

## 2014-07-23 LAB — URINE MICROSCOPIC-ADD ON

## 2014-07-23 LAB — POC URINE PREG, ED: Preg Test, Ur: NEGATIVE

## 2014-07-23 MED ORDER — KETOROLAC TROMETHAMINE 60 MG/2ML IM SOLN
60.0000 mg | Freq: Once | INTRAMUSCULAR | Status: AC
  Administered 2014-07-23: 60 mg via INTRAMUSCULAR
  Filled 2014-07-23: qty 2

## 2014-07-23 MED ORDER — IBUPROFEN 600 MG PO TABS: 600.0000 mg | ORAL_TABLET | Freq: Four times a day (QID) | ORAL | Status: DC | PRN

## 2014-07-23 NOTE — ED Provider Notes (Signed)
CSN: [id number]     Arrival date & time 07/23/14  [id number] History   First MD Initiated Contact with Patient 07/23/14 [phone]     Chief Complaint  Patient presents with  . Abdominal Pain     (Consider location/radiation/quality/duration/timing/severity/associated sxs/prior Treatment) HPI  Kathy [name] is a 24 y.o. female with PMH of interstitial cystitis, ovarian cyst, PID, bipolar, anxiety, depression presenting with 2 months of right upper quadrant intermittent abdominal pain that she describes as aching and at times stabbing. Patient had the pain for 30 minutes this a.m. and now she starts a mild ache. Patient states it may be worse with eating she can find no definitive aggravating and alleviating factors. She reports mild nausea but no vomiting. No hematochezia or melanotic stool. No fevers or chills. No pelvic complaints. Patient with history of interstitial cystitis and reports frequency but no change in the symptoms.    Past Medical History  Diagnosis Date  . Orthostatic hypotension   . Asthma   . Depression   . Bipolar 1 disorder   . Headache(784.0)   . HSV-1 (herpes simplex virus 1) infection   . History of pelvic inflammatory disease   . History of abnormal cervical Pap smear   . Dysthymic disorder   . History of suicide attempt     06/ 2012  overdose oxycontin  . Pelvic pain in female   . History of ovarian cyst   . Frequency of urination   . Nocturia   . Cystitis, interstitial    Past Surgical History  Procedure Laterality Date  . Tonsillectomy  09-10-2007  . Dilation and curettage of uterus  2011    W/ SUCTION  . Cysto with hydrodistension N/A 02/25/2014    Procedure: CYSTOSCOPY/HYDRODISTENSION WITH INSTILLATION;  Surgeon: Martina SinnerScott A MacDiarmid, MD;  [facility];  Service: Urology;  Laterality: N/A;   Family History  Problem Relation Age of Onset  . Adopted: Yes  . Anesthesia problems Neg Hx   . Breast cancer Maternal Grandmother     History  Substance Use Topics  . Smoking status: Former Smoker -- 0.25 packs/day for .3 years    Types: Cigarettes    Quit date: 03/28/2014  . Smokeless tobacco: Never Used     Comment: 3 CIG PER DAY SINCE JULY 2015  . Alcohol Use: Yes     Comment: rare   OB History    Gravida Para Term Preterm AB TAB SAB Ectopic Multiple Living   3 2 2  0 1 0 1 0 0 2     Review of Systems 10 Systems reviewed and are negative for acute change except as noted in the HPI.    Allergies  Diflucan  Home Medications   Prior to Admission medications   Medication Sig Start Date End Date Taking? Authorizing Provider  albuterol (PROVENTIL HFA;VENTOLIN HFA) 108 (90 BASE) MCG/ACT inhaler Inhale into the lungs every 6 (six) hours as needed for wheezing or shortness of breath.   Yes Historical Provider, MD  ibuprofen (ADVIL,MOTRIN) 200 MG tablet Take 200 mg by mouth every 6 (six) hours as needed for moderate pain.   Yes Historical Provider, MD  ciprofloxacin (CIPRO) 500 MG tablet Take 1 tablet (500 mg total) by mouth 2 (two) times daily. Patient not taking: Reported on 07/23/2014 04/10/14   Bertram DenverKaren E Teague Clark, PA-C  ibuprofen (ADVIL,MOTRIN) 600 MG tablet Take 1 tablet (600 mg total) by mouth every 6 (six) hours as needed. 07/23/14  Oswaldo Conroy, PA-C   BP 121/61 mmHg  Pulse 65  Temp(Src) 97.9 F (36.6 C) (Oral)  Resp 18  SpO2 100% Physical Exam  Constitutional: She appears well-developed and well-nourished. No distress.  HENT:  Head: Normocephalic and atraumatic.  Eyes: Conjunctivae and EOM are normal. Right eye exhibits no discharge. Left eye exhibits no discharge.  Cardiovascular: Normal rate and regular rhythm.   Pulmonary/Chest: Effort normal and breath sounds normal. No respiratory distress. She has no wheezes.  Abdominal: Soft. Bowel sounds are normal. She exhibits no distension.  Mild right upper quadrant abdominal tenderness without rebound, rigidity, guarding. Right CVA tenderness.   Neurological: She is alert. She exhibits normal muscle tone. Coordination normal.  Skin: Skin is warm and dry. She is not diaphoretic.  Nursing note and vitals reviewed.   ED Course  Procedures (including critical care time) Labs Review Labs Reviewed  URINALYSIS, ROUTINE W REFLEX MICROSCOPIC - Abnormal; Notable for the following:    Leukocytes, UA SMALL (*)    All other components within normal limits  CBC WITH DIFFERENTIAL/PLATELET  COMPREHENSIVE METABOLIC PANEL  LIPASE, BLOOD  URINE MICROSCOPIC-ADD ON  POC URINE PREG, ED    Imaging Review No results found.   EKG Interpretation None      MDM   Final diagnoses:  RUQ abdominal tenderness   Pt presenting with intermittent RUQ abdominal tenderness. VSS. Mild abdominal tenderness on exam. No evidence of peritonitis. Lab workup reassuring. Ua without evidence of infection. toradol administered with signficant improvement. Discussed the option of RUQ abd ultrasound to evaluate for cholelithiasis. Pt refused I doubt cholecystitis due to no leukocytosis, fever and benign exam. I feel outpatient management is appropriate. On repeat exam patient with no tenderness. Pt non toxic and non septic appearing. Pt stable for discharge with PCP and GI/surgical follow up.  Discussed return precautions with patient. Discussed all results and patient verbalizes understanding and agrees with plan.   Oswaldo Conroy, PA-C 07/23/14 1300  Linwood Dibbles, MD 07/25/14 [phone]

## 2014-07-23 NOTE — Discharge Instructions (Signed)
Return to the emergency room with worsening of symptoms, new symptoms or with symptoms that are concerning, especially fevers, abdominal pain in one area, unable to keep down fluids, blood in stool or vomit, severe pain, you feel faint, lightheaded or pass out. Please call your doctor for a followup appointment within 24-48 hours. When you talk to your doctor please let them know that you were seen in the emergency department and have them acquire all of your records so that they can discuss the findings with you and formulate a treatment plan to fully care for your new and ongoing problems. If you do not have a primary care provider please call the number below under ED resources to establish care with a provider and follow up.  Follow up with GI. Read below information and follow recommendations.  Abdominal Pain Many things can cause abdominal pain. Usually, abdominal pain is not caused by a disease and will improve without treatment. It can often be observed and treated at home. Your health care provider will do a physical exam and possibly order blood tests and X-rays to help determine the seriousness of your pain. However, in many cases, more time must pass before a clear cause of the pain can be found. Before that point, your health care provider may not know if you need more testing or further treatment. HOME CARE INSTRUCTIONS  Monitor your abdominal pain for any changes. The following actions may help to alleviate any discomfort you are experiencing:  Only take over-the-counter or prescription medicines as directed by your health care provider.  Do not take laxatives unless directed to do so by your health care provider.  Try a clear liquid diet (broth, tea, or water) as directed by your health care provider. Slowly move to a bland diet as tolerated. SEEK MEDICAL CARE IF:  You have unexplained abdominal pain.  You have abdominal pain associated with nausea or diarrhea.  You have pain  when you urinate or have a bowel movement.  You experience abdominal pain that wakes you in the night.  You have abdominal pain that is worsened or improved by eating food.  You have abdominal pain that is worsened with eating fatty foods.  You have a fever. SEEK IMMEDIATE MEDICAL CARE IF:   Your pain does not go away within 2 hours.  You keep throwing up (vomiting).  Your pain is felt only in portions of the abdomen, such as the right side or the left lower portion of the abdomen.  You pass bloody or black tarry stools. MAKE SURE YOU:  Understand these instructions.   Will watch your condition.   Will get help right away if you are not doing well or get worse.  Document Released: 12/22/2004 Document Revised: 03/19/2013 Document Reviewed: 11/21/2012 [facility] Patient Information 2015 [place], [place]. This information is not intended to replace advice given to you by your health care provider. Make sure you discuss any questions you have with your health care provider.  Low-Fat Diet for Pancreatitis or Gallbladder Conditions A low-fat diet can be helpful if you have pancreatitis or a gallbladder condition. With these conditions, your pancreas and gallbladder have trouble digesting fats. A healthy eating plan with less fat will help rest your pancreas and gallbladder and reduce your symptoms. WHAT DO I NEED TO KNOW ABOUT THIS DIET?  Eat a low-fat diet.  Reduce your fat intake to less than 20-30% of your total daily calories. This is less than 50-60 g of fat per day.  Remember that you need some fat in your diet. Ask your dietician what your daily goal should be.  Choose nonfat and low-fat healthy foods. Look for the words "nonfat," "low fat," or "fat free."  As a guide, look on the label and choose foods with less than 3 g of fat per serving. Eat only one serving.  Avoid alcohol.  Do not smoke. If you need help quitting, talk with your health care provider.  Eat small  frequent meals instead of three large heavy meals. WHAT FOODS CAN I EAT? Grains Include healthy grains and starches such as potatoes, wheat bread, fiber-rich cereal, and brown rice. Choose whole grain options whenever possible. In adults, whole grains should account for 45-65% of your daily calories.  Fruits and Vegetables Eat plenty of fruits and vegetables. Fresh fruits and vegetables add fiber to your diet. Meats and Other Protein Sources Eat lean meat such as chicken and pork. Trim any fat off of meat before cooking it. Eggs, fish, and beans are other sources of protein. In adults, these foods should account for 10-35% of your daily calories. Dairy Choose low-fat milk and dairy options. Dairy includes fat and protein, as well as calcium.  Fats and Oils Limit high-fat foods such as fried foods, sweets, baked goods, sugary drinks.  Other Creamy sauces and condiments, such as mayonnaise, can add extra fat. Think about whether or not you need to use them, or use smaller amounts or low fat options. WHAT FOODS ARE NOT RECOMMENDED?  High fat foods, such as:  [employer]aked goods.  Ice cream.  [place] toast.  Sweet rolls.  Pizza.  Cheese bread.  Foods covered with batter, butter, creamy sauces, or cheese.  Fried foods.  Sugary drinks and desserts.  Foods that cause gas or bloating Document Released: 03/19/2013 Document Reviewed: 03/19/2013 [facility] Patient Information 2015 [place]. This information is not intended to replace advice given to you by your health care provider. Make sure you discuss any questions you have with your health care provider.

## 2014-07-23 NOTE — ED Notes (Signed)
Pt reports intermittent RUQ pain for past few months. Pain began again this am for 30 minutes. Cannot think of any aggravating/relieving factors. No n/v, some intermittent diarrhea. No dysuria.

## 2014-08-28 ENCOUNTER — Inpatient Hospital Stay (HOSPITAL_COMMUNITY)
Admission: AD | Admit: 2014-08-28 | Discharge: 2014-08-28 | Disposition: A | Discharge status: Home / Self Care | Payer: BLUE CROSS/BLUE SHIELD | Source: Ambulatory Visit | Attending: Obstetrics and Gynecology | Admitting: Obstetrics and Gynecology

## 2014-08-28 ENCOUNTER — Inpatient Hospital Stay (HOSPITAL_COMMUNITY): Payer: BLUE CROSS/BLUE SHIELD

## 2014-08-28 ENCOUNTER — Encounter (HOSPITAL_COMMUNITY): Payer: Self-pay | Admitting: Advanced Practice Midwife

## 2014-08-28 DIAGNOSIS — O26899 Other specified pregnancy related conditions, unspecified trimester: Secondary | ICD-10-CM

## 2014-08-28 DIAGNOSIS — O468X1 Other antepartum hemorrhage, first trimester: Secondary | ICD-10-CM

## 2014-08-28 DIAGNOSIS — O208 Other hemorrhage in early pregnancy: Secondary | ICD-10-CM | POA: Diagnosis not present

## 2014-08-28 DIAGNOSIS — R109 Unspecified abdominal pain: Secondary | ICD-10-CM

## 2014-08-28 DIAGNOSIS — Z3A01 Less than 8 weeks gestation of pregnancy: Secondary | ICD-10-CM | POA: Diagnosis not present

## 2014-08-28 DIAGNOSIS — Z87891 Personal history of nicotine dependence: Secondary | ICD-10-CM | POA: Insufficient documentation

## 2014-08-28 DIAGNOSIS — O9989 Other specified diseases and conditions complicating pregnancy, childbirth and the puerperium: Secondary | ICD-10-CM

## 2014-08-28 DIAGNOSIS — O418X10 Other specified disorders of amniotic fluid and membranes, first trimester, not applicable or unspecified: Secondary | ICD-10-CM

## 2014-08-28 DIAGNOSIS — O209 Hemorrhage in early pregnancy, unspecified: Secondary | ICD-10-CM | POA: Diagnosis present

## 2014-08-28 DIAGNOSIS — R1031 Right lower quadrant pain: Secondary | ICD-10-CM | POA: Diagnosis not present

## 2014-08-28 LAB — CBC WITH DIFFERENTIAL/PLATELET
BASOS ABS: 0 10*3/uL (ref 0.0–0.1)
Basophils Relative: 0 % (ref 0–1)
EOS ABS: 0.1 10*3/uL (ref 0.0–0.7)
Eosinophils Relative: 1 % (ref 0–5)
HCT: 34.2 % — ABNORMAL LOW (ref 36.0–46.0)
Hemoglobin: 11.3 g/dL — ABNORMAL LOW (ref 12.0–15.0)
Lymphocytes Relative: 35 % (ref 12–46)
Lymphs Abs: 3 10*3/uL (ref 0.7–4.0)
MCH: 27.8 pg (ref 26.0–34.0)
MCHC: 33 g/dL (ref 30.0–36.0)
MCV: 84 fL (ref 78.0–100.0)
MONO ABS: 0.6 10*3/uL (ref 0.1–1.0)
MONOS PCT: 7 % (ref 3–12)
NEUTROS ABS: 5 10*3/uL (ref 1.7–7.7)
Neutrophils Relative %: 57 % (ref 43–77)
Platelets: 236 10*3/uL (ref 150–400)
RBC: 4.07 MIL/uL (ref 3.87–5.11)
RDW: 12.7 % (ref 11.5–15.5)
WBC: 8.6 10*3/uL (ref 4.0–10.5)

## 2014-08-28 LAB — URINALYSIS, ROUTINE W REFLEX MICROSCOPIC
BILIRUBIN URINE: NEGATIVE
Glucose, UA: NEGATIVE mg/dL
Ketones, ur: NEGATIVE mg/dL
Nitrite: NEGATIVE
Protein, ur: NEGATIVE mg/dL
Specific Gravity, Urine: <1.005 — ABNORMAL LOW (ref 1.005–1.030)
Urobilinogen, UA: 0.2 mg/dL (ref 0.0–1.0)
pH: 5.5 (ref 5.0–8.0)

## 2014-08-28 LAB — POCT PREGNANCY, URINE: Preg Test, Ur: POSITIVE — AB

## 2014-08-28 LAB — URINE MICROSCOPIC-ADD ON

## 2014-08-28 LAB — WET PREP, GENITAL
TRICH WET PREP: NONE SEEN
Yeast Wet Prep HPF POC: NONE SEEN

## 2014-08-28 LAB — HCG, QUANTITATIVE, PREGNANCY: hCG, Beta Chain, Quant, S: 50428 m[IU]/mL — ABNORMAL HIGH (ref <5)

## 2014-08-28 LAB — ABO/RH: ABO/RH(D): A POS

## 2014-08-28 NOTE — MAU Note (Addendum)
Started bleeding while at work. Little more than spotting, no clots.  Pain in RLQ past few days- goes in to back.  Had a little light bleeding the other day

## 2014-08-28 NOTE — Discharge Instructions (Signed)

## 2014-08-28 NOTE — MAU Provider Note (Signed)
History     CSN: [id number]  Arrival date and time: 08/28/14 1519   First Provider Initiated Contact with Patient 08/28/14 2012      Chief Complaint  Patient presents with  . Vaginal Bleeding  . Abdominal Pain   HPI  Ms. Kathy [name] is a 24 y.o. [id number] at [redacted]w[redacted]d who presents to MAU today with complaint of vaginal bleeding that started this afternoon. The patient states last intercourse was Monday. She states mild associated RLQ pain. She has not taken any medications for pain. She feels bleeding has improved since onset. She denies fever.   OB History    Gravida Para Term Preterm AB TAB SAB Ectopic Multiple Living   4 2 2  0 1 0 1 0 0 2      Past Medical History  Diagnosis Date  . Orthostatic hypotension   . Asthma   . Depression   . Bipolar 1 disorder   . Headache(784.0)   . HSV-1 (herpes simplex virus 1) infection   . History of pelvic inflammatory disease   . History of abnormal cervical Pap smear   . Dysthymic disorder   . History of suicide attempt     06/ 2012  overdose oxycontin  . Pelvic pain in female   . History of ovarian cyst   . Frequency of urination   . Nocturia   . Cystitis, interstitial     Past Surgical History  Procedure Laterality Date  . Tonsillectomy  09-10-2007  . Dilation and curettage of uterus  2011    W/ SUCTION  . Cysto with hydrodistension N/A 02/25/2014    Procedure: CYSTOSCOPY/HYDRODISTENSION WITH INSTILLATION;  Surgeon: Martina Sinner, MD;  Location: [facility];  Service: Urology;  Laterality: N/A;    Family History  Problem Relation Age of Onset  . Adopted: Yes  . Anesthesia problems Neg Hx   . Breast cancer Maternal Grandmother     History  Substance Use Topics  . Smoking status: Former Smoker -- 0.25 packs/day for .3 years    Types: Cigarettes    Quit date: 03/28/2014  . Smokeless tobacco: Never Used     Comment: 3 CIG PER DAY SINCE JULY 2015  . Alcohol Use: No     Comment: rare     Allergies:  Allergies  Allergen Reactions  . Diflucan [Fluconazole] Itching    Prescriptions prior to admission  Medication Sig Dispense Refill Last Dose  . albuterol (PROVENTIL HFA;VENTOLIN HFA) 108 (90 BASE) MCG/ACT inhaler Inhale 2 puffs into the lungs every 6 (six) hours as needed for wheezing or shortness of breath (emergency asthma attacks).    unknown  . cephALEXin (KEFLEX) 500 MG capsule Take 500 mg by mouth as needed (after sex to prevent uti).   Past Week at Unknown time  . Prenatal Vit-Fe Fumarate-FA (PRENATAL MULTIVITAMIN) TABS tablet Take 2 tablets by mouth daily at 12 noon.   08/28/2014 at Unknown time  . ciprofloxacin (CIPRO) 500 MG tablet Take 1 tablet (500 mg total) by mouth 2 (two) times daily. (Patient not taking: Reported on 07/23/2014) 10 tablet 0 Completed Course at Unknown time  . ibuprofen (ADVIL,MOTRIN) 600 MG tablet Take 1 tablet (600 mg total) by mouth every 6 (six) hours as needed. (Patient not taking: Reported on 08/28/2014) 30 tablet 0 Not Taking at Unknown time    Review of Systems  Constitutional: Negative for fever and malaise/fatigue.  Gastrointestinal: Positive for abdominal pain. Negative for nausea, vomiting, diarrhea and  constipation.  Genitourinary:       + vaginal bleeding   Physical Exam   Pulse 65, temperature 98.3 F (36.8 C), temperature source Oral, resp. rate 18, height 5' 3.5" (1.613 m), weight 170 lb (77.111 kg), last menstrual period 07/16/2014.  Physical Exam  Nursing note and vitals reviewed. Constitutional: She is oriented to person, place, and time. She appears well-developed and well-nourished. No distress.  HENT:  Head: Normocephalic and atraumatic.  Cardiovascular: Normal rate.   Respiratory: Effort normal.  GI: Soft. She exhibits no distension and no mass. There is no tenderness. There is no rebound and no guarding.  Genitourinary: Uterus is not enlarged and not tender. Cervix exhibits no motion tenderness, no discharge and  no friability. Right adnexum displays no mass and no tenderness. Left adnexum displays no mass and no tenderness. There is bleeding (scant light brown/pink discharge noted) in the vagina. No vaginal discharge found.  Neurological: She is alert and oriented to person, place, and time.  Skin: Skin is warm and dry. No erythema.  Psychiatric: She has a normal mood and affect.  Cervix: closed, thick  Results for orders placed or performed during the hospital encounter of 08/28/14 (from the past 24 hour(s))  Urinalysis, Routine w reflex microscopic (not at [facility])     Status: Abnormal   Collection Time: 08/28/14  3:50 PM  Result Value Ref Range   Color, Urine STRAW (A) YELLOW   APPearance CLEAR CLEAR   Specific Gravity, Urine <1.005 (L) 1.005 - 1.030   pH 5.5 5.0 - 8.0   Glucose, UA NEGATIVE NEGATIVE mg/dL   Hgb urine dipstick LARGE (A) NEGATIVE   Bilirubin Urine NEGATIVE NEGATIVE   Ketones, ur NEGATIVE NEGATIVE mg/dL   Protein, ur NEGATIVE NEGATIVE mg/dL   Urobilinogen, UA 0.2 0.0 - 1.0 mg/dL   Nitrite NEGATIVE NEGATIVE   Leukocytes, UA SMALL (A) NEGATIVE  Urine microscopic-add on     Status: Abnormal   Collection Time: 08/28/14  3:50 PM  Result Value Ref Range   Squamous Epithelial / LPF FEW (A) RARE   WBC, UA 0-2 <3 WBC/hpf   RBC / HPF 0-2 <3 RBC/hpf  Pregnancy, urine POC     Status: Abnormal   Collection Time: 08/28/14  4:14 PM  Result Value Ref Range   Preg Test, Ur POSITIVE (A) NEGATIVE  ABO/Rh     Status: None   Collection Time: 08/28/14  5:01 PM  Result Value Ref Range   ABO/RH(D) A POS   CBC with Differential/Platelet     Status: Abnormal   Collection Time: 08/28/14  5:01 PM  Result Value Ref Range   WBC 8.6 4.0 - 10.5 K/uL   RBC 4.07 3.87 - 5.11 MIL/uL   Hemoglobin 11.3 (L) 12.0 - 15.0 g/dL   HCT 16.1 (L) 09.6 - 04.5 %   MCV 84.0 78.0 - 100.0 fL   MCH 27.8 26.0 - 34.0 pg   MCHC 33.0 30.0 - 36.0 g/dL   RDW 40.9 81.1 - 91.4 %   Platelets 236 150 - 400 K/uL    Neutrophils Relative % 57 43 - 77 %   Neutro Abs 5.0 1.7 - 7.7 K/uL   Lymphocytes Relative 35 12 - 46 %   Lymphs Abs 3.0 0.7 - 4.0 K/uL   Monocytes Relative 7 3 - 12 %   Monocytes Absolute 0.6 0.1 - 1.0 K/uL   Eosinophils Relative 1 0 - 5 %   Eosinophils Absolute 0.1 0.0 - 0.7 K/uL  Basophils Relative 0 0 - 1 %   Basophils Absolute 0.0 0.0 - 0.1 K/uL  hCG, quantitative, pregnancy     Status: Abnormal   Collection Time: 08/28/14  5:01 PM  Result Value Ref Range   hCG, Beta Chain, Quant, S [id number] (H) <5 mIU/mL  Wet prep, genital     Status: Abnormal   Collection Time: 08/28/14  7:00 PM  Result Value Ref Range   Yeast Wet Prep HPF POC NONE SEEN NONE SEEN   Trich, Wet Prep NONE SEEN NONE SEEN   Clue Cells Wet Prep HPF POC FEW (A) NONE SEEN   WBC, Wet Prep HPF POC FEW (A) NONE SEEN   [place] Ob Comp Less 14 Wks  08/28/2014   CLINICAL DATA:  Acute onset of vaginal bleeding and right lower quadrant abdominal pain. Initial encounter.  EXAM: OBSTETRIC <14 WK US AND TRANSVAGINAL OB US  TECHNIQUE: Both transabdominal and transvaginal ultrasound examinations were performed for complete evaluation of the gestation as well as the maternal uterus, adnexal regions, and pelvic cul-de-sac. Transvaginal technique was performed to assess early pregnancy.  COMPARISON:  None.  FINDINGS: Intrauterine gestational sac: Visualized/normal in shape.  Yolk sac:  Yes  Embryo:  Yes  Cardiac Activity: Yes  Heart Rate: 118  bpm  CRL:  2.4  mm   5 w   6 d                  US EDC: 04/24/2015  Maternal uterus/adnexae: A small amount of subchorionic hemorrhage is noted. The uterus is otherwise unremarkable in appearance.  The ovaries are within normal limits. The right ovary measures 3.8 x 2.9 by 2.2 cm, while the left ovary measures 2.2 x 1.6 x 1.4 cm. No suspicious adnexal masses are seen; there is no evidence for ovarian torsion.  No free fluid is seen within the pelvic cul-de-sac.  IMPRESSION: 1. Single live intrauterine pregnancy  noted, with a crown-rump length of 2.4 mm, corresponding to a gestational age of [redacted] weeks 6 days. This matches the gestational age of [redacted] weeks 1 day by LMP, reflecting an estimated date of delivery of April 22, 2015. 2. Small amount of subchorionic hemorrhage noted.   Electronically Signed   By: Roanna RaiderJeffery  Chang M.D.   On: 08/28/2014 19:37   [place] Ob Transvaginal  08/28/2014   CLINICAL DATA:  Acute onset of vaginal bleeding and right lower quadrant abdominal pain. Initial encounter.  EXAM: OBSTETRIC <14 WK US AND TRANSVAGINAL OB US  TECHNIQUE: Both transabdominal and transvaginal ultrasound examinations were performed for complete evaluation of the gestation as well as the maternal uterus, adnexal regions, and pelvic cul-de-sac. Transvaginal technique was performed to assess early pregnancy.  COMPARISON:  None.  FINDINGS: Intrauterine gestational sac: Visualized/normal in shape.  Yolk sac:  Yes  Embryo:  Yes  Cardiac Activity: Yes  Heart Rate: 118  bpm  CRL:  2.4  mm   5 w   6 d                  US EDC: 04/24/2015  Maternal uterus/adnexae: A small amount of subchorionic hemorrhage is noted. The uterus is otherwise unremarkable in appearance.  The ovaries are within normal limits. The right ovary measures 3.8 x 2.9 by 2.2 cm, while the left ovary measures 2.2 x 1.6 x 1.4 cm. No suspicious adnexal masses are seen; there is no evidence for ovarian torsion.  No free fluid is seen within the pelvic cul-de-sac.  IMPRESSION:  1. Single live intrauterine pregnancy noted, with a crown-rump length of 2.4 mm, corresponding to a gestational age of [redacted] weeks 6 days. This matches the gestational age of [redacted] weeks 1 day by LMP, reflecting an estimated date of delivery of April 22, 2015. 2. Small amount of subchorionic hemorrhage noted.   Electronically Signed   By: Roanna Raider M.D.   On: 08/28/2014 19:37    MAU Course  Procedures None  MDM +UPT UA, wet prep, GC/chlamydia, CBC, ABO/Rh, quant hCG, HIV, RPR and [place] today to rule  out ectopic pregnancy  Assessment and Plan  A: SIUP at [redacted]w[redacted]d Small subchorionic hemorrhage  P: Discharge home Bleeding precautions and first trimester warning signs discussed Pelvic rest advised Avoid heavy lifting and high impact activity. Work note given Patient advised to follow-up with [place] as scheduled to start prenatal care Patient may return to MAU as needed or if her condition were to change or worsen   Marny Lowenstein, PA-C  08/28/2014, 8:12 PM

## 2014-08-29 LAB — CULTURE, OB URINE: Special Requests: NORMAL

## 2014-08-29 LAB — GC/CHLAMYDIA PROBE AMP (~~LOC~~) NOT AT ARMC
Chlamydia: NEGATIVE
Neisseria Gonorrhea: NEGATIVE

## 2014-08-29 LAB — HIV ANTIBODY (ROUTINE TESTING W REFLEX): HIV Screen 4th Generation wRfx: NONREACTIVE

## 2014-09-16 LAB — OB RESULTS CONSOLE HIV ANTIBODY (ROUTINE TESTING): HIV: NONREACTIVE

## 2014-09-16 LAB — OB RESULTS CONSOLE ANTIBODY SCREEN: ANTIBODY SCREEN: NEGATIVE

## 2014-09-16 LAB — OB RESULTS CONSOLE ABO/RH: RH Type: POSITIVE

## 2014-09-16 LAB — OB RESULTS CONSOLE RPR: RPR: NONREACTIVE

## 2014-09-16 LAB — OB RESULTS CONSOLE HEPATITIS B SURFACE ANTIGEN: Hepatitis B Surface Ag: NEGATIVE

## 2014-09-16 LAB — OB RESULTS CONSOLE RUBELLA ANTIBODY, IGM: Rubella: IMMUNE

## 2014-09-16 LAB — OB RESULTS CONSOLE GC/CHLAMYDIA
CHLAMYDIA, DNA PROBE: NEGATIVE
Gonorrhea: NEGATIVE

## 2014-10-11 ENCOUNTER — Encounter (HOSPITAL_COMMUNITY): Payer: Self-pay | Admitting: Emergency Medicine

## 2014-10-11 ENCOUNTER — Ambulatory Visit (HOSPITAL_COMMUNITY)
Admission: RE | Admit: 2014-10-11 | Discharge: 2014-10-11 | Disposition: A | Discharge status: Home / Self Care | Payer: BLUE CROSS/BLUE SHIELD | Attending: Psychiatry | Admitting: Psychiatry

## 2014-10-11 ENCOUNTER — Emergency Department (HOSPITAL_COMMUNITY)
Admission: EM | Admit: 2014-10-11 | Discharge: 2014-10-12 | Disposition: A | Discharge status: Home / Self Care | Payer: BLUE CROSS/BLUE SHIELD | Attending: Emergency Medicine | Admitting: Emergency Medicine

## 2014-10-11 DIAGNOSIS — Z8619 Personal history of other infectious and parasitic diseases: Secondary | ICD-10-CM | POA: Insufficient documentation

## 2014-10-11 DIAGNOSIS — Z915 Personal history of self-harm: Secondary | ICD-10-CM | POA: Diagnosis not present

## 2014-10-11 DIAGNOSIS — J45909 Unspecified asthma, uncomplicated: Secondary | ICD-10-CM | POA: Diagnosis not present

## 2014-10-11 DIAGNOSIS — F341 Dysthymic disorder: Secondary | ICD-10-CM | POA: Insufficient documentation

## 2014-10-11 DIAGNOSIS — Z8742 Personal history of other diseases of the female genital tract: Secondary | ICD-10-CM | POA: Insufficient documentation

## 2014-10-11 DIAGNOSIS — Z79899 Other long term (current) drug therapy: Secondary | ICD-10-CM | POA: Insufficient documentation

## 2014-10-11 DIAGNOSIS — Z87891 Personal history of nicotine dependence: Secondary | ICD-10-CM | POA: Diagnosis not present

## 2014-10-11 DIAGNOSIS — F419 Anxiety disorder, unspecified: Secondary | ICD-10-CM | POA: Diagnosis present

## 2014-10-11 DIAGNOSIS — F39 Unspecified mood [affective] disorder: Secondary | ICD-10-CM

## 2014-10-11 DIAGNOSIS — Z91040 Latex allergy status: Secondary | ICD-10-CM | POA: Insufficient documentation

## 2014-10-11 DIAGNOSIS — F4321 Adjustment disorder with depressed mood: Secondary | ICD-10-CM | POA: Diagnosis present

## 2014-10-11 DIAGNOSIS — F314 Bipolar disorder, current episode depressed, severe, without psychotic features: Secondary | ICD-10-CM | POA: Diagnosis present

## 2014-10-11 DIAGNOSIS — Z008 Encounter for other general examination: Secondary | ICD-10-CM | POA: Diagnosis present

## 2014-10-11 LAB — URINALYSIS, ROUTINE W REFLEX MICROSCOPIC
BILIRUBIN URINE: NEGATIVE
GLUCOSE, UA: NEGATIVE mg/dL
HGB URINE DIPSTICK: NEGATIVE
Ketones, ur: NEGATIVE mg/dL
Nitrite: NEGATIVE
PH: 8 (ref 5.0–8.0)
Protein, ur: NEGATIVE mg/dL
SPECIFIC GRAVITY, URINE: 1.03 (ref 1.005–1.030)
UROBILINOGEN UA: 0.2 mg/dL (ref 0.0–1.0)

## 2014-10-11 LAB — I-STAT BETA HCG BLOOD, ED (MC, WL, AP ONLY)

## 2014-10-11 LAB — URINE MICROSCOPIC-ADD ON

## 2014-10-11 NOTE — ED Notes (Signed)
Pt. Noted sleeping in room. No complaints or concerns voiced. No distress or abnormal behavior noted. Will continue to monitor with security cameras. Q 15 minute rounds continue. 

## 2014-10-11 NOTE — ED Notes (Addendum)
Pt arrived via [place]. Pt reported depression d/t pregnancy (12weeks) and my kids daddy are home after 1 year. I have mod problem where I can't stablize myself. My behavior has gotten worse. Pt denies SI/HI. Pt stated that she has images of harming herself but she knows its wrong and she does not want to kill herself or anyone else. I'm having these images because I'm depressed.

## 2014-10-11 NOTE — ED Notes (Signed)
Report received from Janie Rambo RN. Pt. Alert and oriented in no distress denies SI, HI, AVH and pain.  Pt. Instructed to come to me with problems or concerns.Will continue to monitor for safety via security cameras and Q 15 minute checks. 
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
## 2014-10-11 NOTE — ED Notes (Signed)
MD at bedside. 

## 2014-10-11 NOTE — Progress Notes (Signed)
Seeking inpatient psychiatric placement for pt.  [facility]_ per [facility] [place]- [facility]- per [facility]- per [facility]- per [facility] (for waitlist)   All other facilities contacted are at capacity and not accepting referrals.  Ilean SkillMeghan Heiress Williamson, MSW, LCSWA Clinical Social Work, Disposition  10/11/2014 [phone]

## 2014-10-11 NOTE — ED Provider Notes (Signed)
CSN: [id number]     Arrival date & time 10/11/14  0908 History   First MD Initiated Contact with Patient 10/11/14 [phone]     Chief Complaint  Patient presents with  . Medical Clearance     (Consider location/radiation/quality/duration/timing/severity/associated sxs/prior Treatment) HPI Comments: Pt with hx of mood disorder who is G4 P2 in her 1st trimester currently who comes in with cc of mood disorder. Pt reports that she has been having some depression symptoms and mood swings the last 1 month. She hasnt taken her meds in several years, but feels like she might need some help. PT has has had some suicidal thoughts, but she has not had any plans or attempts, and she is a single mother who wants to be there for her kids. She denies any substance abuse or significant med hx besides interstitial cystitis. She has no ob related complains.  The history is provided by the patient.    Past Medical History  Diagnosis Date  . Orthostatic hypotension   . Asthma   . Depression   . Bipolar 1 disorder   . Headache(784.0)   . HSV-1 (herpes simplex virus 1) infection   . History of pelvic inflammatory disease   . History of abnormal cervical Pap smear   . Dysthymic disorder   . History of suicide attempt     06/ 2012  overdose oxycontin  . Pelvic pain in female   . History of ovarian cyst   . Frequency of urination   . Nocturia   . Cystitis, interstitial    Past Surgical History  Procedure Laterality Date  . Tonsillectomy  09-10-2007  . Dilation and curettage of uterus  2011    W/ SUCTION  . Cysto with hydrodistension N/A 02/25/2014    Procedure: CYSTOSCOPY/HYDRODISTENSION WITH INSTILLATION;  Surgeon: Martina SinnerScott A MacDiarmid, MD;  [facility] [place];  Service: Urology;  Laterality: N/A;   Family History  Problem Relation Age of Onset  . Adopted: Yes  . Anesthesia problems Neg Hx   . Breast cancer Maternal Grandmother    History  Substance Use Topics  . Smoking status:  Former Smoker -- 0.25 packs/day for .3 years    Types: Cigarettes    Quit date: 03/28/2014  . Smokeless tobacco: Never Used     Comment: 3 CIG PER DAY SINCE JULY 2015  . Alcohol Use: No     Comment: rare   OB History    Gravida Para Term Preterm AB TAB SAB Ectopic Multiple Living   4 2 2  0 1 0 1 0 0 2     Review of Systems  Respiratory: Negative for shortness of breath.   Cardiovascular: Negative for chest pain.  Gastrointestinal: Negative for nausea, vomiting and abdominal pain.  Genitourinary: Negative for dysuria.  Musculoskeletal: Negative for neck pain.  Neurological: Negative for headaches.  Psychiatric/Behavioral: Positive for suicidal ideas and agitation. Negative for hallucinations and self-injury. The patient is not nervous/anxious.       Allergies  Diflucan and Latex  Home Medications   Prior to Admission medications   Medication Sig Start Date End Date Taking? Authorizing Provider  albuterol (PROVENTIL HFA;VENTOLIN HFA) 108 (90 BASE) MCG/ACT inhaler Inhale 2 puffs into the lungs every 6 (six) hours as needed for wheezing or shortness of breath (emergency asthma attacks).    Yes Historical Provider, MD  Prenatal Vit-Fe Fumarate-FA (PRENATAL MULTIVITAMIN) TABS tablet Take 1 tablet by mouth daily at 12 noon.    Yes Historical Provider,  MD   BP 118/60 mmHg  Pulse 79  Temp(Src) 98.7 F (37.1 C) (Oral)  Resp 18  Ht  (1.626 m)  Wt 175 lb (79.379 kg)  BMI 30.02 kg/m2  SpO2 100%  LMP 07/16/2014 Physical Exam  Constitutional: She is oriented to person, place, and time. She appears well-developed.  HENT:  Head: Normocephalic and atraumatic.  Eyes: EOM are normal.  Neck: Normal range of motion. Neck supple.  Cardiovascular: Normal rate.   Pulmonary/Chest: Effort normal.  Abdominal: Bowel sounds are normal. She exhibits no distension. There is no tenderness.  Neurological: She is alert and oriented to person, place, and time.  Skin: Skin is warm and dry.   Psychiatric: She has a normal mood and affect. Her behavior is normal. Judgment and thought content normal.  Nursing note and vitals reviewed.   ED Course  Procedures (including critical care time) Labs Review Labs Reviewed  URINALYSIS, ROUTINE W REFLEX MICROSCOPIC (NOT AT [facility]) - Abnormal; Notable for the following:    APPearance CLOUDY (*)    Leukocytes, UA TRACE (*)    All other components within normal limits  URINE MICROSCOPIC-ADD ON - Abnormal; Notable for the following:    Squamous Epithelial / LPF FEW (*)    All other components within normal limits  URINE CULTURE  I-STAT BETA HCG BLOOD, ED ([facility], [facility], [facility] ONLY)    Imaging Review No results found.   EKG Interpretation None      MDM   Final diagnoses:  Mood disorder    Pt sent here from [facility] fir med clearance. She is pregnant and has some depressed thoughts, agitation, mood swings. She has had thoughts every now and then of suicide, but that is not atypical for her. She has no active plans or suicidal ideations as we spoke. She would appreciate speaking to a therapist / psychiatrist and possibly started on meds. [facility] reportedly told her she will be seen once medically cleared, so we have placed a tts consult. Pt is medically cleared.    Derwood Kaplan, MD 10/11/14 1213

## 2014-10-12 DIAGNOSIS — F419 Anxiety disorder, unspecified: Secondary | ICD-10-CM | POA: Diagnosis present

## 2014-10-12 DIAGNOSIS — F4321 Adjustment disorder with depressed mood: Secondary | ICD-10-CM

## 2014-10-12 NOTE — ED Notes (Addendum)
Written dc instructions reviewed w/ patient.  Pt encouraged to contact her OB tomorrow, [facility].  Pt instructed to  return/seek treatment for any suicidal thoughts or urges.  Pt verbalized understanding, no thoughts of si/hi at this time.  Pt ambulatory to dc window w/ mHt.  Belongings returned after leaving the area.

## 2014-10-12 NOTE — ED Notes (Signed)
Pt. Noted sleeping in room. No complaints or concerns voiced. No distress or abnormal behavior noted. Will continue to monitor with security cameras. Q 15 minute rounds continue. 
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
## 2014-10-13 LAB — URINE CULTURE: SPECIAL REQUESTS: NORMAL

## 2014-10-14 NOTE — [facility] Suicide Risk Assessment (Signed)
Suicide Risk Assessment  Discharge Assessment   [facility]   Demographic Factors:  Adolescent or young adult and Caucasian  Total Time spent with patient: 45 minutes  Musculoskeletal: Strength & Muscle Tone: within normal limits Gait & Station: normal Patient leans: N/A  Psychiatric Specialty Exam: Physical Exam  Review of Systems  Constitutional: Negative.   HENT: Negative.   Eyes: Negative.   Respiratory: Negative.   Cardiovascular: Negative.   Gastrointestinal: Negative.   Genitourinary: Negative.   Musculoskeletal: Negative.   Skin: Negative.   Neurological: Negative.   Endo/Heme/Allergies: Negative.   Psychiatric/Behavioral: Positive for depression. The patient is nervous/anxious.     Blood pressure 112/47, pulse 57, temperature 98.4 F (36.9 C), temperature source Oral, resp. rate 16, height 5\' 4"  (1.626 m), weight 79.379 kg (175 lb), last menstrual period 07/16/2014, SpO2 98 %.Body mass index is 30.02 kg/(m^2).  General Appearance: Casual  Eye Contact::  Good  Speech:  Normal Rate  Volume:  Normal  Mood:  Depressed  Affect:  Congruent  Thought Process:  Coherent  Orientation:  Full (Time, Place, and Person)  Thought Content:  WDL  Suicidal Thoughts:  No  Homicidal Thoughts:  No  Memory:  Immediate;   Good Recent;   Good Remote;   Good  Judgement:  Good  Insight:  Good  Psychomotor Activity:  Normal  Concentration:  Good  Recall:  Good  Fund of Knowledge:Good  Language: Good  Akathisia:  No  Handed:  Right  AIMS (if indicated):     Assets:  Housing Leisure Time Physical Health Resilience Social Support  ADL's:  Intact  Cognition: WNL  Sleep:         Has this patient used any form of tobacco in the last 30 days? (Cigarettes, Smokeless Tobacco, Cigars, and/or Pipes) No  Mental Status Per Nursing Assessment::   On Admission:   Depression  Current Mental Status by Physician: NA  Loss Factors: NA  Historical  Factors: NA  Risk Reduction Factors:   Pregnancy, Responsible for children under 25 years of age, Sense of responsibility to family, Living with another person, especially a relative, Positive social support and Positive coping skills or problem solving skills  Continued Clinical Symptoms:  Depression, mild  Cognitive Features That Contribute To Risk:  None    Suicide Risk:  Minimal: No identifiable suicidal ideation.  Patients presenting with no risk factors but with morbid ruminations; may be classified as minimal risk based on the severity of the depressive symptoms  Principal Problem: Adjustment disorder with depressed mood Discharge Diagnoses:  Patient Active Problem List   Diagnosis Date Noted  . Adjustment disorder with depressed mood [F43.21] 10/12/2014    Priority: High  . Anxiety [F41.9] 10/12/2014    Priority: High  . Viral gastroenteritis [A08.4] 10/18/2011      Plan Of Care/Follow-up recommendations:  Activity:  as tolerated Diet:  heart healthy diet  Is patient on multiple antipsychotic therapies at discharge:  No   Has Patient had three or more failed trials of antipsychotic monotherapy by history:  No  Recommended Plan for Multiple Antipsychotic Therapies: NA    LORD, JAMISON, PMH-NP 10/14/2014, 11:05 AM

## 2014-11-15 ENCOUNTER — Encounter (HOSPITAL_COMMUNITY): Payer: Self-pay | Admitting: *Deleted

## 2014-11-15 ENCOUNTER — Inpatient Hospital Stay (HOSPITAL_COMMUNITY)
Admission: AD | Admit: 2014-11-15 | Discharge: 2014-11-15 | Disposition: A | Discharge status: Home / Self Care | Payer: BLUE CROSS/BLUE SHIELD | Source: Ambulatory Visit | Attending: Obstetrics and Gynecology | Admitting: Obstetrics and Gynecology

## 2014-11-15 DIAGNOSIS — Z87891 Personal history of nicotine dependence: Secondary | ICD-10-CM | POA: Diagnosis not present

## 2014-11-15 DIAGNOSIS — B373 Candidiasis of vulva and vagina: Secondary | ICD-10-CM

## 2014-11-15 DIAGNOSIS — B3731 Acute candidiasis of vulva and vagina: Secondary | ICD-10-CM

## 2014-11-15 DIAGNOSIS — L293 Anogenital pruritus, unspecified: Secondary | ICD-10-CM | POA: Diagnosis not present

## 2014-11-15 HISTORY — DX: Unspecified abnormal cytological findings in specimens from vagina: R87.629

## 2014-11-15 LAB — WET PREP, GENITAL
Trich, Wet Prep: NONE SEEN
YEAST WET PREP: NONE SEEN

## 2014-11-15 LAB — URINE MICROSCOPIC-ADD ON

## 2014-11-15 LAB — URINALYSIS, ROUTINE W REFLEX MICROSCOPIC
Bilirubin Urine: NEGATIVE
Glucose, UA: NEGATIVE mg/dL
HGB URINE DIPSTICK: NEGATIVE
Ketones, ur: NEGATIVE mg/dL
Nitrite: NEGATIVE
PH: 7 (ref 5.0–8.0)
PROTEIN: NEGATIVE mg/dL
SPECIFIC GRAVITY, URINE: 1.015 (ref 1.005–1.030)
Urobilinogen, UA: 0.2 mg/dL (ref 0.0–1.0)

## 2014-11-15 MED ORDER — TERCONAZOLE 0.4 % VA CREA: 1.0000 | TOPICAL_CREAM | Freq: Every day | VAGINAL | Status: DC

## 2014-11-15 NOTE — Discharge Instructions (Signed)
Get your prescription and use externally as needed.

## 2014-11-15 NOTE — MAU Provider Note (Signed)
History     CSN: [id number]  Arrival date and time: 11/15/14 1747   First Provider Initiated Contact with Patient 11/15/14 1828      Chief Complaint  Patient presents with  . Vaginal Itching  . Vaginal Discharge   HPI Kathy [name] 24 y.o.  Comes to MAU for pelvic exam due to vaginal itching.  Had sex one week ago and since that time has had vaginal itching that has not resolved.  Was thinking it was a yeast infection.  Today noticed that she had white vaginal discharge but it did not look like a yeast discharge so she came here for STD testing.    OB History    Gravida Para Term Preterm AB TAB SAB Ectopic Multiple Living   4 2 2  0 1 0 1 0 0 2      Past Medical History  Diagnosis Date  . Orthostatic hypotension   . Asthma   . Bipolar 1 disorder   . Headache(784.0)   . HSV-1 (herpes simplex virus 1) infection   . History of pelvic inflammatory disease   . History of abnormal cervical Pap smear   . History of suicide attempt     06/ 2012  overdose oxycontin  . Pelvic pain in female   . History of ovarian cyst   . Frequency of urination   . Nocturia   . Cystitis, interstitial   . Vaginal Pap smear, abnormal     ok since  . Depression     better now, situational  . Dysthymic disorder     Past Surgical History  Procedure Laterality Date  . Tonsillectomy  09-10-2007  . Dilation and curettage of uterus  2011    W/ SUCTION  . Cysto with hydrodistension N/A 02/25/2014    Procedure: CYSTOSCOPY/HYDRODISTENSION WITH INSTILLATION;  Surgeon: Martina Sinner, MD;  Location: [facility];  Service: Urology;  Laterality: N/A;    Family History  Problem Relation Age of Onset  . Adopted: Yes  . Anesthesia problems Neg Hx   . Breast cancer Maternal Grandmother     Social History  Substance Use Topics  . Smoking status: Former Smoker -- 0.25 packs/day for .3 years    Types: Cigarettes    Quit date: 03/28/2014  . Smokeless tobacco: Never Used  .  Alcohol Use: No     Comment: rare    Allergies:  Allergies  Allergen Reactions  . Diflucan [Fluconazole] Itching  . Latex Itching    redness     Prescriptions prior to admission  Medication Sig Dispense Refill Last Dose  . albuterol (PROVENTIL HFA;VENTOLIN HFA) 108 (90 BASE) MCG/ACT inhaler Inhale 2 puffs into the lungs every 6 (six) hours as needed for wheezing or shortness of breath (emergency asthma attacks).    unknown  . Prenatal Vit-Fe Fumarate-FA (PRENATAL MULTIVITAMIN) TABS tablet Take 1 tablet by mouth daily at 12 noon.    10/10/2014 at Unknown time    Review of Systems  Constitutional: Negative for fever.  Gastrointestinal: Negative for nausea, vomiting and abdominal pain.  Genitourinary:       Vaginal discharge. Vaginal itching. No vaginal bleeding. No dysuria.   Physical Exam   Blood pressure 96/54, pulse 78, temperature 97.7 F (36.5 C), temperature source Oral, resp. rate 18, last menstrual period 07/16/2014.  Physical Exam  Nursing note and vitals reviewed. Constitutional: She is oriented to person, place, and time. She appears well-developed and well-nourished.  HENT:  Head: Normocephalic.  Eyes: EOM are normal.  Neck: Neck supple.  GI: Soft. There is no tenderness.  Genitourinary:  Speculum exam: Itching is localized to folds of tissue on either side of clitoris - no lesions, skin is hypopigmented consistent with external yeast in the folds. Vagina - Small amount of yellow discharge, no odor Cervix - No contact bleeding Bimanual exam: Cervix closed Uterus gravid Adnexa non tender, no masses bilaterally GC/Chlam, wet prep done Chaperone present for exam.  Musculoskeletal: Normal range of motion.  Neurological: She is alert and oriented to person, place, and time.  Skin: Skin is warm and dry.  Psychiatric: She has a normal mood and affect.    MAU Course  Procedures Results for orders placed or performed during the hospital encounter of  11/15/14 (from the past 24 hour(s))  Urinalysis, Routine w reflex microscopic (not at [facility])     Status: Abnormal   Collection Time: 11/15/14  5:57 PM  Result Value Ref Range   Color, Urine YELLOW YELLOW   APPearance CLEAR CLEAR   Specific Gravity, Urine 1.015 1.005 - 1.030   pH 7.0 5.0 - 8.0   Glucose, UA NEGATIVE NEGATIVE mg/dL   Hgb urine dipstick NEGATIVE NEGATIVE   Bilirubin Urine NEGATIVE NEGATIVE   Ketones, ur NEGATIVE NEGATIVE mg/dL   Protein, ur NEGATIVE NEGATIVE mg/dL   Urobilinogen, UA 0.2 0.0 - 1.0 mg/dL   Nitrite NEGATIVE NEGATIVE   Leukocytes, UA SMALL (A) NEGATIVE  Urine microscopic-add on     Status: Abnormal   Collection Time: 11/15/14  5:57 PM  Result Value Ref Range   Squamous Epithelial / LPF FEW (A) RARE   WBC, UA 3-6 <3 WBC/hpf   RBC / HPF 0-2 <3 RBC/hpf   Bacteria, UA FEW (A) RARE  Wet prep, genital     Status: Abnormal   Collection Time: 11/15/14  7:38 PM  Result Value Ref Range   Yeast Wet Prep HPF POC NONE SEEN NONE SEEN   Trich, Wet Prep NONE SEEN NONE SEEN   Clue Cells Wet Prep HPF POC FEW (A) NONE SEEN   WBC, Wet Prep HPF POC MODERATE (A) NONE SEEN    MDM Likely is yeast although not seen on wet prep.    Assessment and Plan  External vulvar itching - likely yeast infection  Plan Will prescribe yeast medication for client to use internally and externally as desired. Cultures pending. Appointment in the office in early September. Call the office if your symptoms worsen.  BURLESON,TERRI 11/15/2014, 7:12 PM

## 2014-11-15 NOTE — MAU Note (Signed)
Had sex about a wk ago, started itching after, started to ease off then resumed.  Had not had a d/c until today.

## 2014-11-15 NOTE — Progress Notes (Signed)
Written and verbal d/c instructions given and understanding voiced. 

## 2014-11-17 LAB — GC/CHLAMYDIA PROBE AMP (~~LOC~~) NOT AT ARMC
Chlamydia: NEGATIVE
Neisseria Gonorrhea: NEGATIVE

## 2014-11-17 LAB — URINE CULTURE
CULTURE: NO GROWTH
SPECIAL REQUESTS: NORMAL

## 2014-12-15 IMAGING — US US PELVIS COMPLETE
1 series · 14 of 25 positions shown · non-contrast
Comparison: None

CLINICAL DATA: Pelvic pain.  Irregular menses.

EXAM:
TRANSABDOMINAL AND TRANSVAGINAL ULTRASOUND OF PELVIS
TECHNIQUE: Both transabdominal and transvaginal ultrasound examinations of the
pelvis were performed. Transabdominal technique was performed for
global imaging of the pelvis including uterus, ovaries, adnexal
regions, and pelvic cul-de-sac. It was necessary to proceed with
endovaginal exam following the transabdominal exam to visualize the
endometrium and ovaries.

[Series 1: us pelvis complete · 14 of 71 slices shown]
[im 1/71]
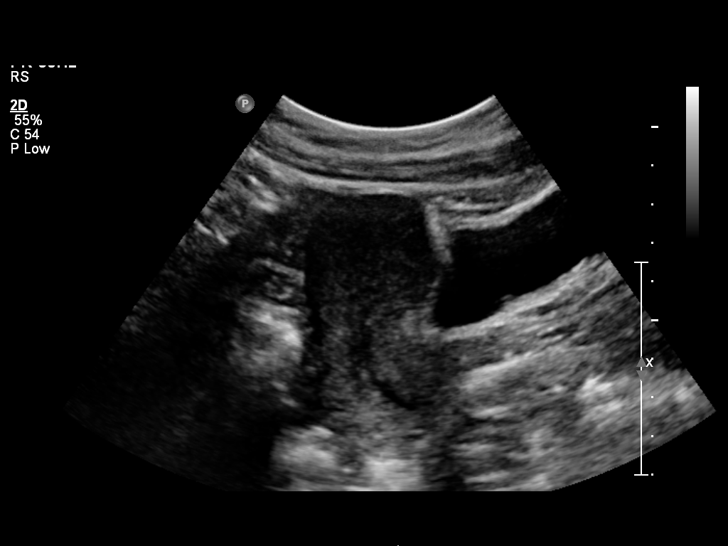
[im 6/71]
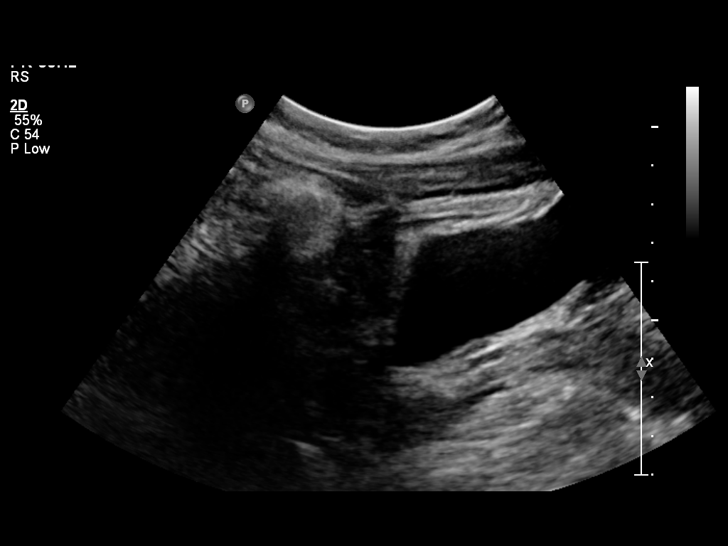
[im 12/71]
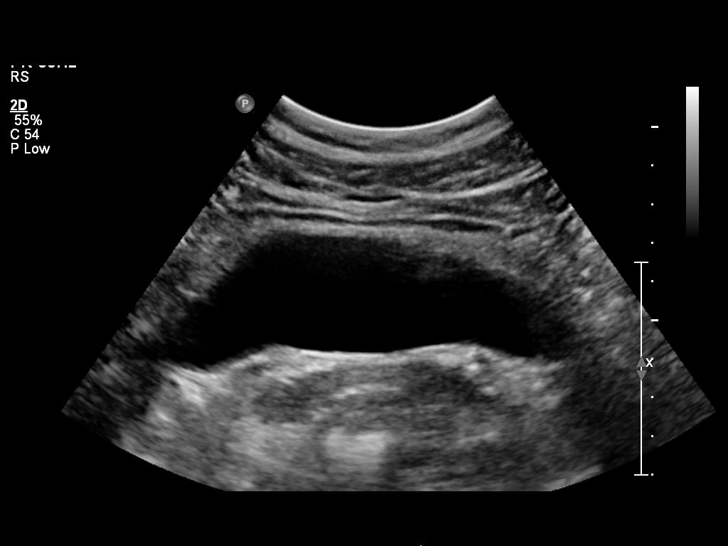
[im 18/71]
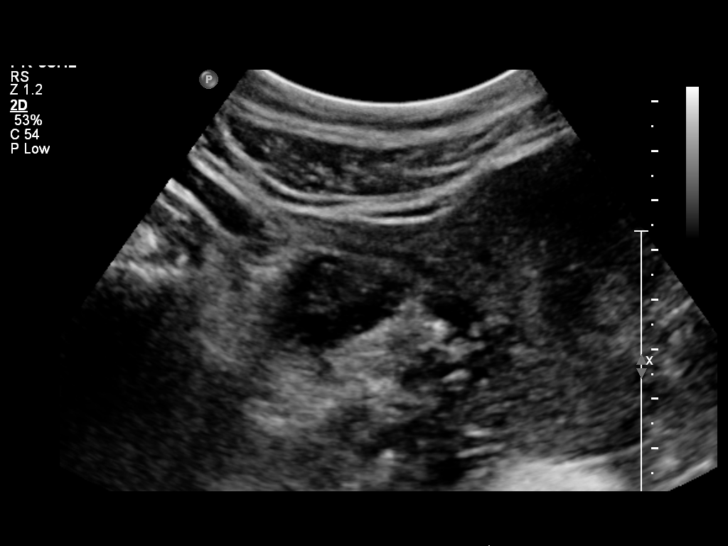
[im 24/71]
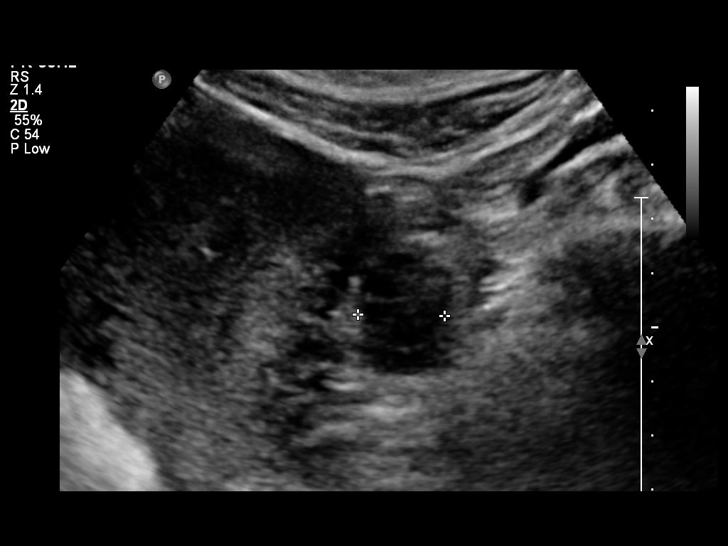
[im 27/71]
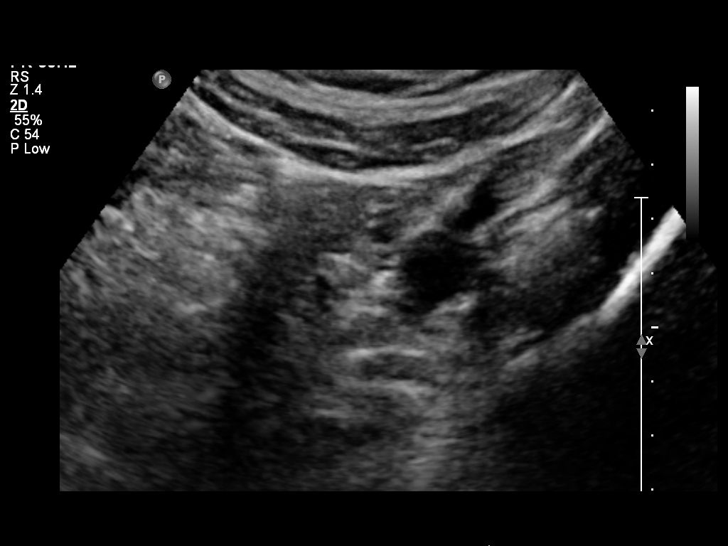
[im 33/71]
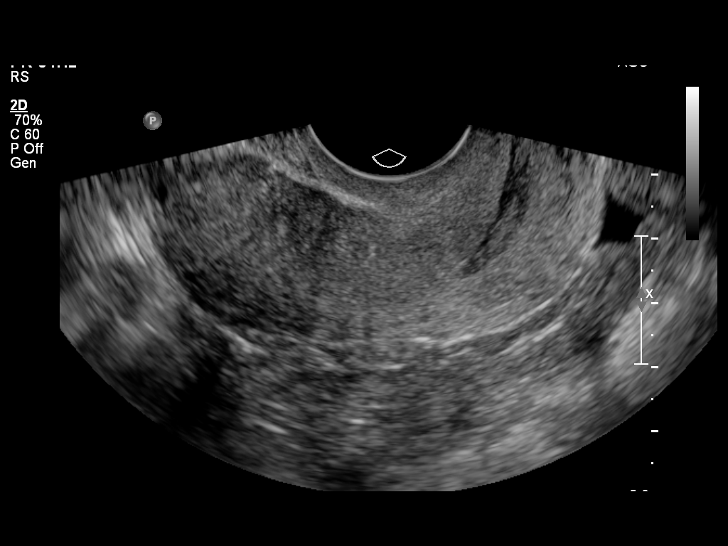
[im 38/71]
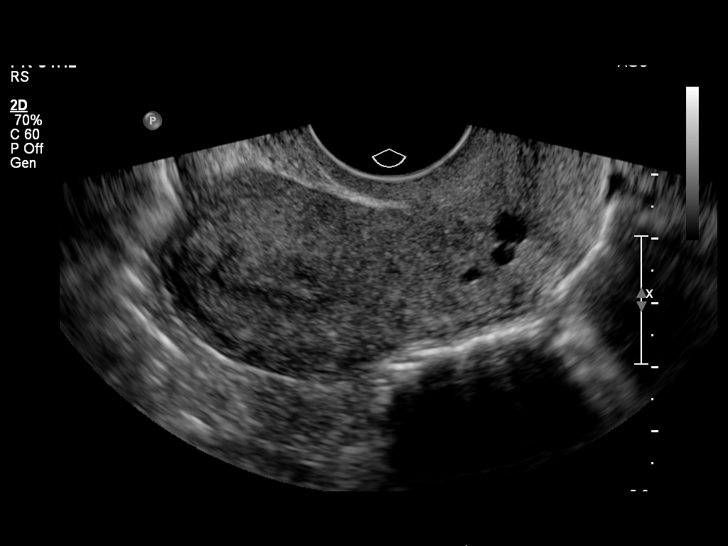
[im 44/71]
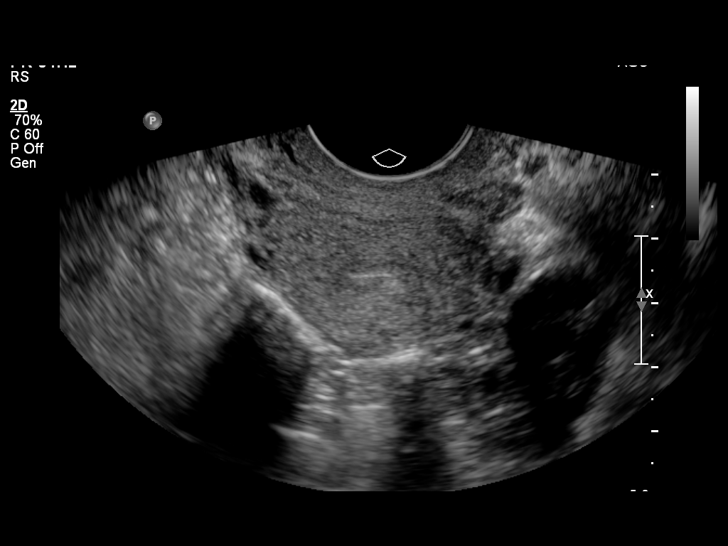
[im 47/71]
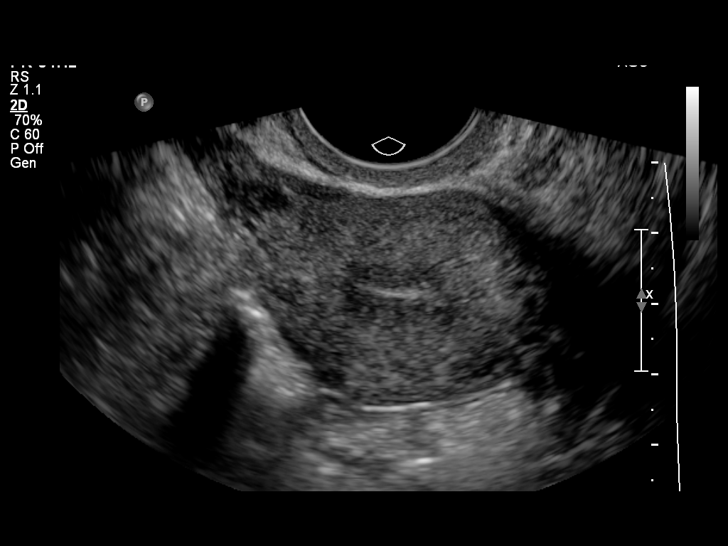
[im 53/71]
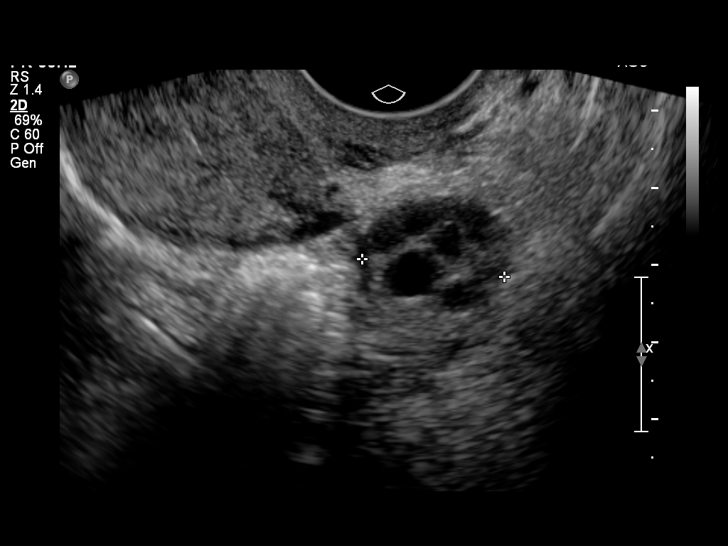
[im 59/71]
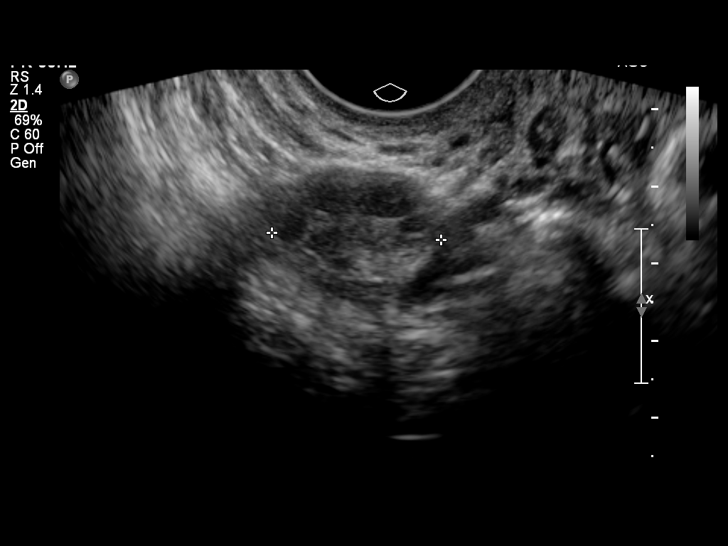
[im 65/71]
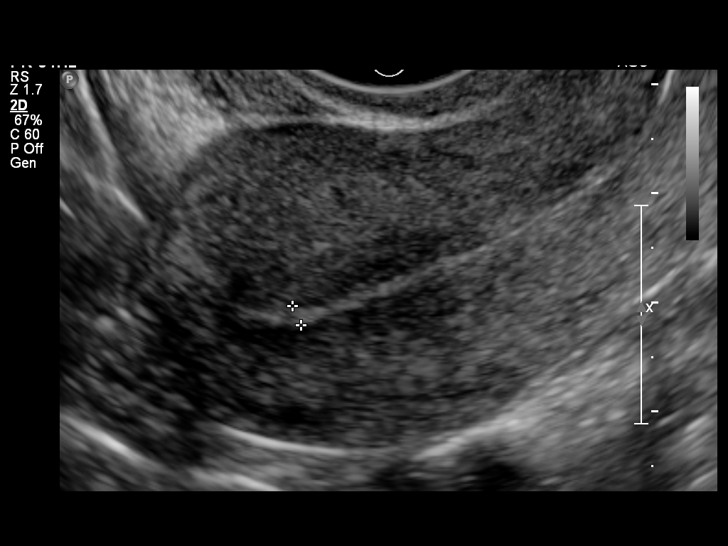
[im 71/71]
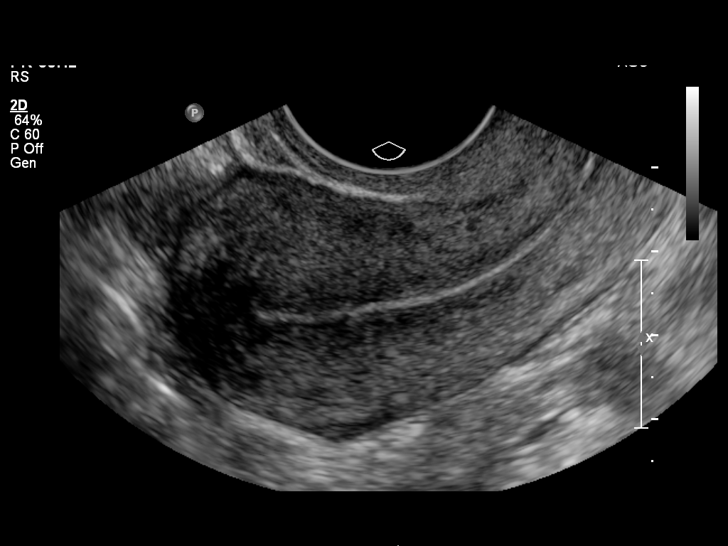

[14 of 25 positions shown; findings below may reference images not displayed]

FINDINGS: Uterus

Measurements: 7.4 x 3.4 x 4.1 cm. No fibroids or other mass
visualized.

Endometrium

Thickness: 2 mm.  No focal abnormality visualized.

Right ovary

Measurements: 3.1 x 1.8 x 2.2 cm. Normal appearance/no adnexal mass.

Left ovary

Measurements: 3.0 x 2.3 x 1.9 cm. Normal appearance/no adnexal mass.

Other findings

No free fluid.
IMPRESSION: Normal appearance of uterus and both ovaries. No pelvic mass or
other significant abnormality visualized.

## 2015-03-09 ENCOUNTER — Encounter (HOSPITAL_COMMUNITY): Payer: Self-pay | Admitting: *Deleted

## 2015-03-09 ENCOUNTER — Inpatient Hospital Stay (HOSPITAL_COMMUNITY)
Admission: AD | Admit: 2015-03-09 | Discharge: 2015-03-09 | Disposition: A | Discharge status: Home / Self Care | Payer: BLUE CROSS/BLUE SHIELD | Source: Ambulatory Visit | Attending: Obstetrics and Gynecology | Admitting: Obstetrics and Gynecology

## 2015-03-09 DIAGNOSIS — R51 Headache: Secondary | ICD-10-CM | POA: Insufficient documentation

## 2015-03-09 DIAGNOSIS — R0600 Dyspnea, unspecified: Secondary | ICD-10-CM

## 2015-03-09 DIAGNOSIS — Z3A33 33 weeks gestation of pregnancy: Secondary | ICD-10-CM | POA: Insufficient documentation

## 2015-03-09 DIAGNOSIS — R8271 Bacteriuria: Secondary | ICD-10-CM | POA: Diagnosis not present

## 2015-03-09 DIAGNOSIS — Z87891 Personal history of nicotine dependence: Secondary | ICD-10-CM | POA: Insufficient documentation

## 2015-03-09 DIAGNOSIS — R0602 Shortness of breath: Secondary | ICD-10-CM | POA: Insufficient documentation

## 2015-03-09 DIAGNOSIS — O9962 Diseases of the digestive system complicating childbirth: Secondary | ICD-10-CM | POA: Insufficient documentation

## 2015-03-09 DIAGNOSIS — O288 Other abnormal findings on antenatal screening of mother: Secondary | ICD-10-CM | POA: Insufficient documentation

## 2015-03-09 DIAGNOSIS — O99891 Other specified diseases and conditions complicating pregnancy: Secondary | ICD-10-CM

## 2015-03-09 DIAGNOSIS — K219 Gastro-esophageal reflux disease without esophagitis: Secondary | ICD-10-CM | POA: Diagnosis not present

## 2015-03-09 DIAGNOSIS — O9989 Other specified diseases and conditions complicating pregnancy, childbirth and the puerperium: Secondary | ICD-10-CM | POA: Diagnosis not present

## 2015-03-09 DIAGNOSIS — O26893 Other specified pregnancy related conditions, third trimester: Secondary | ICD-10-CM | POA: Diagnosis not present

## 2015-03-09 LAB — URINALYSIS, ROUTINE W REFLEX MICROSCOPIC
Bilirubin Urine: NEGATIVE
Glucose, UA: NEGATIVE mg/dL
Hgb urine dipstick: NEGATIVE
KETONES UR: 15 mg/dL — AB
NITRITE: NEGATIVE
PROTEIN: NEGATIVE mg/dL
Specific Gravity, Urine: >1.03 — ABNORMAL HIGH (ref 1.005–1.030)
pH: 6 (ref 5.0–8.0)

## 2015-03-09 LAB — URINE MICROSCOPIC-ADD ON

## 2015-03-09 MED ORDER — RANITIDINE HCL 150 MG PO TABS: 150.0000 mg | ORAL_TABLET | Freq: Two times a day (BID) | ORAL | Status: DC

## 2015-03-09 MED ORDER — GI COCKTAIL ~~LOC~~
30.0000 mL | Freq: Once | ORAL | Status: AC
  Administered 2015-03-09: 30 mL via ORAL
  Filled 2015-03-09: qty 30

## 2015-03-09 NOTE — MAU Note (Signed)
Started on Friday, has gotten worse. Really bad headache last night, woke up with it this morning; couldn't breath and headache (frontal sinus area).

## 2015-03-09 NOTE — MAU Provider Note (Signed)
History     CSN: [id number]  Arrival date and time: 03/09/15 [id number]   First Provider Initiated Contact with Patient 03/09/15 [phone]      Chief Complaint  Patient presents with  . Headache  . Shortness of Breath   HPI    Ms.Kathy [name] is a 24 y.o. female [phone] at 558w5d presenting to MAU with multiple complaints including shortness of breath X 4 days, headache, and chest pain. The patient is conversing without witnessed shortness of breath.   She has been taking tums more often for her chest pain/ heartburn. The tums help at times and other times not. The pain worsens when she lays down and after eating.   She feels like she is carrying the baby higher than other pregnancies, and feels she is burping a lot more.   She has not seen a cardiology with this pregnancy, however did with her first pregnancy. She was diagnosed with Orthostatic hypotension.   She currently rates her HA pain 5-6/10. She has not taken anything for the HA. The HA started last night. The headache is located behind both of her eyes; the pain is constant. She feels pressure behind her head and behind both eyes.    OB History    Gravida Para Term Preterm AB TAB SAB Ectopic Multiple Living   4 2 2  0 1 0 1 0 0 2      Past Medical History  Diagnosis Date  . Orthostatic hypotension   . Asthma   . Bipolar 1 disorder (HCC)   . Headache(784.0)   . HSV-1 (herpes simplex virus 1) infection   . History of pelvic inflammatory disease   . History of abnormal cervical Pap smear   . History of suicide attempt     06/ 2012  overdose oxycontin  . Pelvic pain in female   . History of ovarian cyst   . Frequency of urination   . Nocturia   . Cystitis, interstitial   . Vaginal Pap smear, abnormal     ok since  . Depression     better now, situational  . Dysthymic disorder     Past Surgical History  Procedure Laterality Date  . Tonsillectomy  09-10-2007  . Dilation and curettage of uterus  2011    W/  SUCTION  . Cysto with hydrodistension N/A 02/25/2014    Procedure: CYSTOSCOPY/HYDRODISTENSION WITH INSTILLATION;  Surgeon: Martina SinnerScott A MacDiarmid, MD;  [facility];  Service: Urology;  Laterality: N/A;    Family History  Problem Relation Age of Onset  . Adopted: Yes  . Anesthesia problems Neg Hx   . Breast cancer Maternal Grandmother     Social History  Substance Use Topics  . Smoking status: Former Smoker -- 0.25 packs/day for .3 years    Types: Cigarettes    Quit date: 03/28/2014  . Smokeless tobacco: Never Used  . Alcohol Use: No     Comment: rare    Allergies:  Allergies  Allergen Reactions  . Diflucan [Fluconazole] Itching  . Latex Itching    redness     Prescriptions prior to admission  Medication Sig Dispense Refill Last Dose  . albuterol (PROVENTIL HFA;VENTOLIN HFA) 108 (90 BASE) MCG/ACT inhaler Inhale 2 puffs into the lungs every 6 (six) hours as needed for wheezing or shortness of breath (emergency asthma attacks).    rescue  . Prenatal Vit-Fe Fumarate-FA (PRENATAL MULTIVITAMIN) TABS tablet Take 1 tablet by mouth daily at 12 noon.  11/15/2014 at Unknown time  . terconazole (TERAZOL 7) 0.4 % vaginal cream Place 1 applicator vaginally at bedtime. Can use externally also. 45 g 0    Results for orders placed or performed during the hospital encounter of 03/09/15 (from the past 48 hour(s))  Urinalysis, Routine w reflex microscopic (not at [facility])     Status: Abnormal   Collection Time: 03/09/15  7:50 AM  Result Value Ref Range   Color, Urine YELLOW YELLOW   APPearance CLEAR CLEAR   Specific Gravity, Urine >1.030 (H) 1.005 - 1.030   pH 6.0 5.0 - 8.0   Glucose, UA NEGATIVE NEGATIVE mg/dL   Hgb urine dipstick NEGATIVE NEGATIVE   Bilirubin Urine NEGATIVE NEGATIVE   Ketones, ur 15 (A) NEGATIVE mg/dL   Protein, ur NEGATIVE NEGATIVE mg/dL   Nitrite NEGATIVE NEGATIVE   Leukocytes, UA SMALL (A) NEGATIVE  Urine microscopic-add on     Status: Abnormal    Collection Time: 03/09/15  7:50 AM  Result Value Ref Range   Squamous Epithelial / LPF 0-5 (A) NONE SEEN   WBC, UA 6-30 0 - 5 WBC/hpf   RBC / HPF 0-5 0 - 5 RBC/hpf   Bacteria, UA MANY (A) NONE SEEN   Urine-Other MUCOUS PRESENT     Review of Systems  Constitutional: Negative for fever and chills.  Respiratory: Positive for shortness of breath.   Cardiovascular: Positive for chest pain ("its just there, hurts worse when she is moving". ).  Gastrointestinal: Positive for heartburn. Negative for nausea, vomiting and abdominal pain.  Neurological: Positive for headaches.   Physical Exam   Blood pressure 117/58, pulse 74, temperature 98.6 F (37 C), resp. rate 18, height  (1.626 m), weight 215 lb (97.523 kg), last menstrual period 07/16/2014, SpO2 99 %.  Physical Exam  Constitutional: She is oriented to person, place, and time. Vital signs are normal. She appears well-developed and well-nourished.  Non-toxic appearance. She does not have a sickly appearance. She does not appear ill. No distress.  HENT:  Head: Normocephalic.  Eyes: Pupils are equal, round, and reactive to light.  Neck: Neck supple.  Cardiovascular: Normal rate, regular rhythm and normal heart sounds.   No murmur heard. Respiratory: Effort normal. No respiratory distress. She has no wheezes. She has no rales. She exhibits no tenderness.  GI: Soft. She exhibits no distension. There is no tenderness.  Musculoskeletal: Normal range of motion.  Neurological: She is alert and oriented to person, place, and time.  Skin: Skin is warm. She is not diaphoretic.  Psychiatric: Her behavior is normal.   Fetal Tracing: Baseline: 145 bpm  Variability: Moderate  Accelerations: 15x15 Decelerations: None Toco: quiet   MAU Course  Procedures  None  MDM  Gi cocktail  EKG shows possible left atrial enlargement; patient is [redacted]w[redacted]d pregnant, otherwise normal sinus rhythm.   Chest pain relieved with Gi cocktail patient  rates her pain 0/10 Patient decline tylenol for HA Patient O2 saturations 99%-100%  On RA.   Urine culture pending: patient is asymptomatic with no urinary complaints.  Orthostatic vitals show a HR increase from sitting to standing; from 98-119  Discussed patient with Dr. Henderson Cloud @ 1000  Assessment and Plan   A:  1. Asymptomatic bacteriuria during pregnancy   2. Gastroesophageal reflux disease, esophagitis presence not specified   3. Shortness of breath due to pregnancy, third trimester   4. Headache in pregnancy, third trimester     P:  Discharge home in stable condition RX: Zantac  Ok to take tylenol as directed on the bottle If symptoms worsen return to MAU Follow up with Dr. Henderson Cloud as scheduled    Duane Lope, NP 03/09/2015 8:24 AM

## 2015-03-09 NOTE — MAU Note (Signed)
Pt presents to MAU with complaints of heart racing, shortness of breath, headache with sinus pressure since Friday. Denies any vaginal bleeding or LOF

## 2015-03-09 NOTE — Discharge Instructions (Signed)
Fetal Movement Counts °Patient Name: __________________________________________________ Patient Due Date: ____________________ °Performing a fetal movement count is highly recommended in high-risk pregnancies, but it is good for every pregnant woman to do. Your health care provider may ask you to start counting fetal movements at 28 weeks of the pregnancy. Fetal movements often increase: °· After eating a full meal. °· After physical activity. °· After eating or drinking something sweet or cold. °· At rest. °Pay attention to when you feel the baby is most active. This will help you notice a pattern of your baby's sleep and wake cycles and what factors contribute to an increase in fetal movement. It is important to perform a fetal movement count at the same time each day when your baby is normally most active.  °HOW TO COUNT FETAL MOVEMENTS °1. Find a quiet and comfortable area to sit or lie down on your left side. Lying on your left side provides the best blood and oxygen circulation to your baby. °2. Write down the day and time on a sheet of paper or in a journal. °3. Start counting kicks, flutters, swishes, rolls, or jabs in a 2-hour period. You should feel at least 10 movements within 2 hours. °4. If you do not feel 10 movements in 2 hours, wait 2-3 hours and count again. Look for a change in the pattern or not enough counts in 2 hours. °SEEK MEDICAL CARE IF: °· You feel less than 10 counts in 2 hours, tried twice. °· There is no movement in over an hour. °· The pattern is changing or taking longer each day to reach 10 counts in 2 hours. °· You feel the baby is not moving as he or she usually does. °Date: ____________ Movements: ____________ Start time: ____________ Finish time: ____________  °Date: ____________ Movements: ____________ Start time: ____________ Finish time: ____________ °Date: ____________ Movements: ____________ Start time: ____________ Finish time: ____________ °Date: ____________ Movements:  ____________ Start time: ____________ Finish time: ____________ °Date: ____________ Movements: ____________ Start time: ____________ Finish time: ____________ °Date: ____________ Movements: ____________ Start time: ____________ Finish time: ____________ °Date: ____________ Movements: ____________ Start time: ____________ Finish time: ____________ °Date: ____________ Movements: ____________ Start time: ____________ Finish time: ____________  °Date: ____________ Movements: ____________ Start time: ____________ Finish time: ____________ °Date: ____________ Movements: ____________ Start time: ____________ Finish time: ____________ °Date: ____________ Movements: ____________ Start time: ____________ Finish time: ____________ °Date: ____________ Movements: ____________ Start time: ____________ Finish time: ____________ °Date: ____________ Movements: ____________ Start time: ____________ Finish time: ____________ °Date: ____________ Movements: ____________ Start time: ____________ Finish time: ____________ °Date: ____________ Movements: ____________ Start time: ____________ Finish time: ____________  °Date: ____________ Movements: ____________ Start time: ____________ Finish time: ____________ °Date: ____________ Movements: ____________ Start time: ____________ Finish time: ____________ °Date: ____________ Movements: ____________ Start time: ____________ Finish time: ____________ °Date: ____________ Movements: ____________ Start time: ____________ Finish time: ____________ °Date: ____________ Movements: ____________ Start time: ____________ Finish time: ____________ °Date: ____________ Movements: ____________ Start time: ____________ Finish time: ____________ °Date: ____________ Movements: ____________ Start time: ____________ Finish time: ____________  °Date: ____________ Movements: ____________ Start time: ____________ Finish time: ____________ °Date: ____________ Movements: ____________ Start time: ____________ Finish  time: ____________ °Date: ____________ Movements: ____________ Start time: ____________ Finish time: ____________ °Date: ____________ Movements: ____________ Start time: ____________ Finish time: ____________ °Date: ____________ Movements: ____________ Start time: ____________ Finish time: ____________ °Date: ____________ Movements: ____________ Start time: ____________ Finish time: ____________ °Date: ____________ Movements: ____________ Start time: ____________ Finish time: ____________  °Date: ____________ Movements: ____________ Start time: ____________ Finish   time: ____________ Date: ____________ Movements: ____________ Start time: ____________ Doreatha MartinFinish time: ____________ Date: ____________ Movements: ____________ Start time: ____________ Doreatha MartinFinish time: ____________ Date: ____________ Movements: ____________ Start time: ____________ Doreatha MartinFinish time: ____________ Date: ____________ Movements: ____________ Start time: ____________ Doreatha MartinFinish time: ____________ Date: ____________ Movements: ____________ Start time: ____________ Doreatha MartinFinish time: ____________ Date: ____________ Movements: ____________ Start time: ____________ Doreatha MartinFinish time: ____________  Date: ____________ Movements: ____________ Start time: ____________ Doreatha MartinFinish time: ____________ Date: ____________ Movements: ____________ Start time: ____________ Doreatha MartinFinish time: ____________ Date: ____________ Movements: ____________ Start time: ____________ Doreatha MartinFinish time: ____________ Date: ____________ Movements: ____________ Start time: ____________ Doreatha MartinFinish time: ____________ Date: ____________ Movements: ____________ Start time: ____________ Doreatha MartinFinish time: ____________ Date: ____________ Movements: ____________ Start time: ____________ Doreatha MartinFinish time: ____________ Date: ____________ Movements: ____________ Start time: ____________ Doreatha MartinFinish time: ____________  Date: ____________ Movements: ____________ Start time: ____________ Doreatha MartinFinish time: ____________ Date: ____________  Movements: ____________ Start time: ____________ Doreatha MartinFinish time: ____________ Date: ____________ Movements: ____________ Start time: ____________ Doreatha MartinFinish time: ____________ Date: ____________ Movements: ____________ Start time: ____________ Doreatha MartinFinish time: ____________ Date: ____________ Movements: ____________ Start time: ____________ Doreatha MartinFinish time: ____________ Date: ____________ Movements: ____________ Start time: ____________ Doreatha MartinFinish time: ____________ Date: ____________ Movements: ____________ Start time: ____________ Doreatha MartinFinish time: ____________  Date: ____________ Movements: ____________ Start time: ____________ Doreatha MartinFinish time: ____________ Date: ____________ Movements: ____________ Start time: ____________ Doreatha MartinFinish time: ____________ Date: ____________ Movements: ____________ Start time: ____________ Doreatha MartinFinish time: ____________ Date: ____________ Movements: ____________ Start time: ____________ Doreatha MartinFinish time: ____________ Date: ____________ Movements: ____________ Start time: ____________ Doreatha MartinFinish time: ____________ Date: ____________ Movements: ____________ Start time: ____________ Doreatha MartinFinish time: ____________   This information is not intended to replace advice given to you by your health care provider. Make sure you discuss any questions you have with your health care provider.   Document Released: 04/13/2006 Document Revised: 04/04/2014 Document Reviewed: 01/09/2012 Elsevier Interactive Patient Education 2016 [employer] Food Choices for Gastroesophageal Reflux Disease, Adult When you have gastroesophageal reflux disease (GERD), the foods you eat and your eating habits are very important. Choosing the right foods can help ease the discomfort of GERD. WHAT GENERAL GUIDELINES DO I NEED TO FOLLOW?  Choose fruits, vegetables, whole grains, low-fat dairy products, and low-fat meat, fish, and poultry.  Limit fats such as oils, salad dressings, butter, nuts, and avocado.  Keep a food diary to identify foods  that cause symptoms.  Avoid foods that cause reflux. These may be different for different people.  Eat frequent small meals instead of three large meals each day.  Eat your meals slowly, in a relaxed setting.  Limit fried foods.  Cook foods using methods other than frying.  Avoid drinking alcohol.  Avoid drinking large amounts of liquids with your meals.  Avoid bending over or lying down until 2-3 hours after eating. WHAT FOODS ARE NOT RECOMMENDED? The following are some foods and drinks that may worsen your symptoms: Vegetables Tomatoes. Tomato juice. Tomato and spaghetti sauce. Chili peppers. Onion and garlic. Horseradish. Fruits Oranges, grapefruit, and lemon (fruit and juice). Meats High-fat meats, fish, and poultry. This includes hot dogs, ribs, ham, sausage, salami, and bacon. Dairy Whole milk and chocolate milk. Sour cream. Cream. Butter. Ice cream. Cream cheese.  Beverages Coffee and tea, with or without caffeine. Carbonated beverages or energy drinks. Condiments Hot sauce. Barbecue sauce.  Sweets/Desserts Chocolate and cocoa. Donuts. Peppermint and spearmint. Fats and Oils High-fat foods, including [place] fries and potato chips. Other Vinegar. Strong spices, such as black pepper, white pepper, red pepper, cayenne, curry powder, cloves, ginger, and chili powder. The items  listed above may not be a complete list of foods and beverages to avoid. Contact your dietitian for more information.   This information is not intended to replace advice given to you by your health care provider. Make sure you discuss any questions you have with your health care provider.   Document Released: 03/14/2005 Document Revised: 04/04/2014 Document Reviewed: 01/16/2013 Elsevier Interactive Patient Education [employer]

## 2015-03-11 LAB — URINE CULTURE: Special Requests: NORMAL

## 2015-03-25 LAB — OB RESULTS CONSOLE GBS: STREP GROUP B AG: POSITIVE

## 2015-03-27 ENCOUNTER — Encounter (HOSPITAL_COMMUNITY): Payer: Self-pay

## 2015-03-27 ENCOUNTER — Inpatient Hospital Stay (HOSPITAL_COMMUNITY)
Admission: AD | Admit: 2015-03-27 | Discharge: 2015-03-27 | Disposition: A | Discharge status: Home / Self Care | Payer: BLUE CROSS/BLUE SHIELD | Source: Ambulatory Visit | Attending: Obstetrics & Gynecology | Admitting: Obstetrics & Gynecology

## 2015-03-27 DIAGNOSIS — Z3A00 Weeks of gestation of pregnancy not specified: Secondary | ICD-10-CM | POA: Diagnosis not present

## 2015-03-27 DIAGNOSIS — O479 False labor, unspecified: Secondary | ICD-10-CM | POA: Insufficient documentation

## 2015-03-27 LAB — URINE MICROSCOPIC-ADD ON

## 2015-03-27 LAB — URINALYSIS, ROUTINE W REFLEX MICROSCOPIC
Bilirubin Urine: NEGATIVE
Glucose, UA: NEGATIVE mg/dL
Hgb urine dipstick: NEGATIVE
Ketones, ur: 15 mg/dL — AB
NITRITE: NEGATIVE
PROTEIN: NEGATIVE mg/dL
Specific Gravity, Urine: 1.01 (ref 1.005–1.030)
pH: 6.5 (ref 5.0–8.0)

## 2015-03-27 NOTE — Progress Notes (Signed)
Dr. Langston MaskerMorris notified that pt's cervical exam is still the same.  Provider states to discharge pt home.

## 2015-03-27 NOTE — Discharge Instructions (Signed)
Third Trimester of Pregnancy °The third trimester is from week 29 through week 42, months 7 through 9. The third trimester is a time when the fetus is growing rapidly. At the end of the ninth month, the fetus is about 20 inches in length and weighs 6-10 pounds.  °BODY CHANGES °Your body goes through many changes during pregnancy. The changes vary from woman to woman.  °· Your weight will continue to increase. You can expect to gain 25-35 pounds (11-16 kg) by the end of the pregnancy. °· You may begin to get stretch marks on your hips, abdomen, and breasts. °· You may urinate more often because the fetus is moving lower into your pelvis and pressing on your bladder. °· You may develop or continue to have heartburn as a result of your pregnancy. °· You may develop constipation because certain hormones are causing the muscles that push waste through your intestines to slow down. °· You may develop hemorrhoids or swollen, bulging veins (varicose veins). °· You may have pelvic pain because of the weight gain and pregnancy hormones relaxing your joints between the bones in your pelvis. Backaches may result from overexertion of the muscles supporting your posture. °· You may have changes in your hair. These can include thickening of your hair, rapid growth, and changes in texture. Some women also have hair loss during or after pregnancy, or hair that feels dry or thin. Your hair will most likely return to normal after your baby is born. °· Your breasts will continue to grow and be tender. A yellow discharge may leak from your breasts called colostrum. °· Your belly button may stick out. °· You may feel short of breath because of your expanding uterus. °· You may notice the fetus "dropping," or moving lower in your abdomen. °· You may have a bloody mucus discharge. This usually occurs a few days to a week before labor begins. °· Your cervix becomes thin and soft (effaced) near your due date. °WHAT TO EXPECT AT YOUR PRENATAL  EXAMS  °You will have prenatal exams every 2 weeks until week 36. Then, you will have weekly prenatal exams. During a routine prenatal visit: °· You will be weighed to make sure you and the fetus are growing normally. °· Your blood pressure is taken. °· Your abdomen will be measured to track your baby's growth. °· The fetal heartbeat will be listened to. °· Any test results from the previous visit will be discussed. °· You may have a cervical check near your due date to see if you have effaced. °At around 36 weeks, your caregiver will check your cervix. At the same time, your caregiver will also perform a test on the secretions of the vaginal tissue. This test is to determine if a type of bacteria, Group B streptococcus, is present. Your caregiver will explain this further. °Your caregiver may ask you: °· What your birth plan is. °· How you are feeling. °· If you are feeling the baby move. °· If you have had any abnormal symptoms, such as leaking fluid, bleeding, severe headaches, or abdominal cramping. °· If you are using any tobacco products, including cigarettes, chewing tobacco, and electronic cigarettes. °· If you have any questions. °Other tests or screenings that may be performed during your third trimester include: °· Blood tests that check for low iron levels (anemia). °· Fetal testing to check the health, activity level, and growth of the fetus. Testing is done if you have certain medical conditions or if   there are problems during the pregnancy. °· HIV (human immunodeficiency virus) testing. If you are at high risk, you may be screened for HIV during your third trimester of pregnancy. °FALSE LABOR °You may feel small, irregular contractions that eventually go away. These are called Braxton Hicks contractions, or false labor. Contractions may last for hours, days, or even weeks before true labor sets in. If contractions come at regular intervals, intensify, or become painful, it is best to be seen by your  caregiver.  °SIGNS OF LABOR  °· Menstrual-like cramps. °· Contractions that are 5 minutes apart or less. °· Contractions that start on the top of the uterus and spread down to the lower abdomen and back. °· A sense of increased pelvic pressure or back pain. °· A watery or bloody mucus discharge that comes from the vagina. °If you have any of these signs before the 37th week of pregnancy, call your caregiver right away. You need to go to the hospital to get checked immediately. °HOME CARE INSTRUCTIONS  °· Avoid all smoking, herbs, alcohol, and unprescribed drugs. These chemicals affect the formation and growth of the baby. °· Do not use any tobacco products, including cigarettes, chewing tobacco, and electronic cigarettes. If you need help quitting, ask your health care provider. You may receive counseling support and other resources to help you quit. °· Follow your caregiver's instructions regarding medicine use. There are medicines that are either safe or unsafe to take during pregnancy. °· Exercise only as directed by your caregiver. Experiencing uterine cramps is a good sign to stop exercising. °· Continue to eat regular, healthy meals. °· Wear a good support bra for breast tenderness. °· Do not use hot tubs, steam rooms, or saunas. °· Wear your seat belt at all times when driving. °· Avoid raw meat, uncooked cheese, cat litter boxes, and soil used by cats. These carry germs that can cause birth defects in the baby. °· Take your prenatal vitamins. °· Take 1500-2000 mg of calcium daily starting at the 20th week of pregnancy until you deliver your baby. °· Try taking a stool softener (if your caregiver approves) if you develop constipation. Eat more high-fiber foods, such as fresh vegetables or fruit and whole grains. Drink plenty of fluids to keep your urine clear or pale yellow. °· Take warm sitz baths to soothe any pain or discomfort caused by hemorrhoids. Use hemorrhoid cream if your caregiver approves. °· If  you develop varicose veins, wear support hose. Elevate your feet for 15 minutes, 3-4 times a day. Limit salt in your diet. °· Avoid heavy lifting, wear low heal shoes, and practice good posture. °· Rest a lot with your legs elevated if you have leg cramps or low back pain. °· Visit your dentist if you have not gone during your pregnancy. Use a soft toothbrush to brush your teeth and be gentle when you floss. °· A sexual relationship may be continued unless your caregiver directs you otherwise. °· Do not travel far distances unless it is absolutely necessary and only with the approval of your caregiver. °· Take prenatal classes to understand, practice, and ask questions about the labor and delivery. °· Make a trial run to the hospital. °· Pack your hospital bag. °· Prepare the baby's nursery. °· Continue to go to all your prenatal visits as directed by your caregiver. °SEEK MEDICAL CARE IF: °· You are unsure if you are in labor or if your water has broken. °· You have dizziness. °· You have   mild pelvic cramps, pelvic pressure, or nagging pain in your abdominal area. °· You have persistent nausea, vomiting, or diarrhea. °· You have a bad smelling vaginal discharge. °· You have pain with urination. °SEEK IMMEDIATE MEDICAL CARE IF:  °· You have a fever. °· You are leaking fluid from your vagina. °· You have spotting or bleeding from your vagina. °· You have severe abdominal cramping or pain. °· You have rapid weight loss or gain. °· You have shortness of breath with chest pain. °· You notice sudden or extreme swelling of your face, hands, ankles, feet, or legs. °· You have not felt your baby move in over an hour. °· You have severe headaches that do not go away with medicine. °· You have vision changes. °  °This information is not intended to replace advice given to you by your health care provider. Make sure you discuss any questions you have with your health care provider. °  °Document Released: 03/08/2001 Document  Revised: 04/04/2014 Document Reviewed: 05/15/2012 °Elsevier Interactive Patient Education ©2016 [employer] °Fetal Movement Counts °Patient Name: __________________________________________________ Patient Due Date: ____________________ °Performing a fetal movement count is highly recommended in high-risk pregnancies, but it is good for every pregnant woman to do. Your health care provider may ask you to start counting fetal movements at 28 weeks of the pregnancy. Fetal movements often increase: °· After eating a full meal. °· After physical activity. °· After eating or drinking something sweet or cold. °· At rest. °Pay attention to when you feel the baby is most active. This will help you notice a pattern of your baby's sleep and wake cycles and what factors contribute to an increase in fetal movement. It is important to perform a fetal movement count at the same time each day when your baby is normally most active.  °HOW TO COUNT FETAL MOVEMENTS °· Find a quiet and comfortable area to sit or lie down on your left side. Lying on your left side provides the best blood and oxygen circulation to your baby. °· Write down the day and time on a sheet of paper or in a journal. °· Start counting kicks, flutters, swishes, rolls, or jabs in a 2-hour period. You should feel at least 10 movements within 2 hours. °· If you do not feel 10 movements in 2 hours, wait 2-3 hours and count again. Look for a change in the pattern or not enough counts in 2 hours. °SEEK MEDICAL CARE IF: °· You feel less than 10 counts in 2 hours, tried twice. °· There is no movement in over an hour. °· The pattern is changing or taking longer each day to reach 10 counts in 2 hours. °· You feel the baby is not moving as he or she usually does. °Date: ____________ Movements: ____________ Start time: ____________ Finish time: ____________  °Date: ____________ Movements: ____________ Start time: ____________ Finish time: ____________ °Date: ____________  Movements: ____________ Start time: ____________ Finish time: ____________ °Date: ____________ Movements: ____________ Start time: ____________ Finish time: ____________ °Date: ____________ Movements: ____________ Start time: ____________ Finish time: ____________ °Date: ____________ Movements: ____________ Start time: ____________ Finish time: ____________ °Date: ____________ Movements: ____________ Start time: ____________ Finish time: ____________ °Date: ____________ Movements: ____________ Start time: ____________ Finish time: ____________  °Date: ____________ Movements: ____________ Start time: ____________ Finish time: ____________ °Date: ____________ Movements: ____________ Start time: ____________ Finish time: ____________ °Date: ____________ Movements: ____________ Start time: ____________ Finish time: ____________ °Date: ____________ Movements: ____________ Start time: ____________ Finish time: ____________ °Date:   ____________ Movements: ____________ Start time: ____________ Finish time: ____________ °Date: ____________ Movements: ____________ Start time: ____________ Finish time: ____________ °Date: ____________ Movements: ____________ Start time: ____________ Finish time: ____________  °Date: ____________ Movements: ____________ Start time: ____________ Finish time: ____________ °Date: ____________ Movements: ____________ Start time: ____________ Finish time: ____________ °Date: ____________ Movements: ____________ Start time: ____________ Finish time: ____________ °Date: ____________ Movements: ____________ Start time: ____________ Finish time: ____________ °Date: ____________ Movements: ____________ Start time: ____________ Finish time: ____________ °Date: ____________ Movements: ____________ Start time: ____________ Finish time: ____________ °Date: ____________ Movements: ____________ Start time: ____________ Finish time: ____________  °Date: ____________ Movements: ____________ Start time:  ____________ Finish time: ____________ °Date: ____________ Movements: ____________ Start time: ____________ Finish time: ____________ °Date: ____________ Movements: ____________ Start time: ____________ Finish time: ____________ °Date: ____________ Movements: ____________ Start time: ____________ Finish time: ____________ °Date: ____________ Movements: ____________ Start time: ____________ Finish time: ____________ °Date: ____________ Movements: ____________ Start time: ____________ Finish time: ____________ °Date: ____________ Movements: ____________ Start time: ____________ Finish time: ____________  °Date: ____________ Movements: ____________ Start time: ____________ Finish time: ____________ °Date: ____________ Movements: ____________ Start time: ____________ Finish time: ____________ °Date: ____________ Movements: ____________ Start time: ____________ Finish time: ____________ °Date: ____________ Movements: ____________ Start time: ____________ Finish time: ____________ °Date: ____________ Movements: ____________ Start time: ____________ Finish time: ____________ °Date: ____________ Movements: ____________ Start time: ____________ Finish time: ____________ °Date: ____________ Movements: ____________ Start time: ____________ Finish time: ____________  °Date: ____________ Movements: ____________ Start time: ____________ Finish time: ____________ °Date: ____________ Movements: ____________ Start time: ____________ Finish time: ____________ °Date: ____________ Movements: ____________ Start time: ____________ Finish time: ____________ °Date: ____________ Movements: ____________ Start time: ____________ Finish time: ____________ °Date: ____________ Movements: ____________ Start time: ____________ Finish time: ____________ °Date: ____________ Movements: ____________ Start time: ____________ Finish time: ____________ °Date: ____________ Movements: ____________ Start time: ____________ Finish time: ____________  °Date:  ____________ Movements: ____________ Start time: ____________ Finish time: ____________ °Date: ____________ Movements: ____________ Start time: ____________ Finish time: ____________ °Date: ____________ Movements: ____________ Start time: ____________ Finish time: ____________ °Date: ____________ Movements: ____________ Start time: ____________ Finish time: ____________ °Date: ____________ Movements: ____________ Start time: ____________ Finish time: ____________ °Date: ____________ Movements: ____________ Start time: ____________ Finish time: ____________ °Date: ____________ Movements: ____________ Start time: ____________ Finish time: ____________  °Date: ____________ Movements: ____________ Start time: ____________ Finish time: ____________ °Date: ____________ Movements: ____________ Start time: ____________ Finish time: ____________ °Date: ____________ Movements: ____________ Start time: ____________ Finish time: ____________ °Date: ____________ Movements: ____________ Start time: ____________ Finish time: ____________ °Date: ____________ Movements: ____________ Start time: ____________ Finish time: ____________ °Date: ____________ Movements: ____________ Start time: ____________ Finish time: ____________ °  °This information is not intended to replace advice given to you by your health care provider. Make sure you discuss any questions you have with your health care provider. °  °Document Released: 04/13/2006 Document Revised: 04/04/2014 Document Reviewed: 01/09/2012 °Elsevier Interactive Patient Education ©2016 [employer] °Braxton Hicks Contractions °Contractions of the uterus can occur throughout pregnancy. Contractions are not always a sign that you are in labor.  °WHAT ARE BRAXTON HICKS CONTRACTIONS?  °Contractions that occur before labor are called Braxton Hicks contractions, or false labor. Toward the end of pregnancy (32-34 weeks), these contractions can develop more often and may become more  forceful. This is not true labor because these contractions do not result in opening (dilatation) and thinning of the cervix. They are sometimes difficult to tell apart from true labor because these contractions can be forceful and people have different pain tolerances. You should   not feel embarrassed if you go to the hospital with false labor. Sometimes, the only way to tell if you are in true labor is for your health care provider to look for changes in the cervix. °If there are no prenatal problems or other health problems associated with the pregnancy, it is completely safe to be sent home with false labor and await the onset of true labor. °HOW CAN YOU TELL THE DIFFERENCE BETWEEN TRUE AND FALSE LABOR? °False Labor °· The contractions of false labor are usually shorter and not as hard as those of true labor.   °· The contractions are usually irregular.   °· The contractions are often felt in the front of the lower abdomen and in the groin.   °· The contractions may go away when you walk around or change positions while lying down.   °· The contractions get weaker and are shorter lasting as time goes on.   °· The contractions do not usually become progressively stronger, regular, and closer together as with true labor.   °True Labor °· Contractions in true labor last 30-70 seconds, become very regular, usually become more intense, and increase in frequency.   °· The contractions do not go away with walking.   °· The discomfort is usually felt in the top of the uterus and spreads to the lower abdomen and low back.   °· True labor can be determined by your health care provider with an exam. This will show that the cervix is dilating and getting thinner.   °WHAT TO REMEMBER °· Keep up with your usual exercises and follow other instructions given by your health care provider.   °· Take medicines as directed by your health care provider.   °· Keep your regular prenatal appointments.   °· Eat and drink lightly if you  think you are going into labor.   °· If Braxton Hicks contractions are making you uncomfortable:   °· Change your position from lying down or resting to walking, or from walking to resting.   °· Sit and rest in a tub of warm water.   °· Drink 2-3 glasses of water. Dehydration may cause these contractions.   °· Do slow and deep breathing several times an hour.   °WHEN SHOULD I SEEK IMMEDIATE MEDICAL CARE? °Seek immediate medical care if: °· Your contractions become stronger, more regular, and closer together.   °· You have fluid leaking or gushing from your vagina.   °· You have a fever.   °· You pass blood-tinged mucus.   °· You have vaginal bleeding.   °· You have continuous abdominal pain.   °· You have low back pain that you never had before.   °· You feel your baby's head pushing down and causing pelvic pressure.   °· Your baby is not moving as much as it used to.   °  °This information is not intended to replace advice given to you by your health care provider. Make sure you discuss any questions you have with your health care provider. °  °Document Released: 03/14/2005 Document Revised: 03/19/2013 Document Reviewed: 12/24/2012 °Elsevier Interactive Patient Education ©2016 [employer] ° °

## 2015-03-27 NOTE — MAU Note (Signed)
Patient presents with contractions 3 minutes apart around 2:00 pm some painful some not, no vaginal bleeding, passed mucus, cervical 2/50 two days ago, positive FM

## 2015-03-29 NOTE — L&D Delivery Note (Signed)
Delivery Note  SVD viable female Apgars 9,9 over 1st deg lac.  Placenta delivered spontaneously intact with 3VC. Repair with 2-0 Chromic with good support and hemostasis noted and R/V exam confirms.  PH art was sent.  [place] cord blood was done.  Mother and baby were doing well.  EBL 100cc  Candice Camp, MD

## 2015-04-15 ENCOUNTER — Other Ambulatory Visit (HOSPITAL_COMMUNITY): Payer: Self-pay | Admitting: Obstetrics and Gynecology

## 2015-04-15 ENCOUNTER — Encounter (HOSPITAL_COMMUNITY): Payer: Self-pay | Admitting: *Deleted

## 2015-04-15 ENCOUNTER — Telehealth (HOSPITAL_COMMUNITY): Payer: Self-pay | Admitting: *Deleted

## 2015-04-15 NOTE — Telephone Encounter (Signed)
Preadmission screen  

## 2015-04-16 ENCOUNTER — Inpatient Hospital Stay (HOSPITAL_COMMUNITY): Payer: BLUE CROSS/BLUE SHIELD | Admitting: Anesthesiology

## 2015-04-16 ENCOUNTER — Encounter (HOSPITAL_COMMUNITY): Payer: Self-pay

## 2015-04-16 ENCOUNTER — Inpatient Hospital Stay (HOSPITAL_COMMUNITY)
Admission: RE | Admit: 2015-04-16 | Discharge: 2015-04-18 | DRG: 775 | Disposition: A | Discharge status: Home / Self Care | Payer: BLUE CROSS/BLUE SHIELD | Source: Ambulatory Visit | Attending: Obstetrics and Gynecology | Admitting: Obstetrics and Gynecology

## 2015-04-16 DIAGNOSIS — O99824 Streptococcus B carrier state complicating childbirth: Secondary | ICD-10-CM | POA: Diagnosis present

## 2015-04-16 DIAGNOSIS — Z3490 Encounter for supervision of normal pregnancy, unspecified, unspecified trimester: Secondary | ICD-10-CM

## 2015-04-16 DIAGNOSIS — Z3A39 39 weeks gestation of pregnancy: Secondary | ICD-10-CM

## 2015-04-16 DIAGNOSIS — Z87891 Personal history of nicotine dependence: Secondary | ICD-10-CM | POA: Diagnosis not present

## 2015-04-16 DIAGNOSIS — O99344 Other mental disorders complicating childbirth: Secondary | ICD-10-CM | POA: Diagnosis present

## 2015-04-16 DIAGNOSIS — F319 Bipolar disorder, unspecified: Secondary | ICD-10-CM | POA: Diagnosis present

## 2015-04-16 LAB — CBC
HEMATOCRIT: 34.1 % — AB (ref 36.0–46.0)
HEMOGLOBIN: 10.9 g/dL — AB (ref 12.0–15.0)
MCH: 27 pg (ref 26.0–34.0)
MCHC: 32 g/dL (ref 30.0–36.0)
MCV: 84.6 fL (ref 78.0–100.0)
Platelets: 212 10*3/uL (ref 150–400)
RBC: 4.03 MIL/uL (ref 3.87–5.11)
RDW: 15 % (ref 11.5–15.5)
WBC: 11.4 10*3/uL — AB (ref 4.0–10.5)

## 2015-04-16 LAB — TYPE AND SCREEN
ABO/RH(D): A POS
Antibody Screen: NEGATIVE

## 2015-04-16 LAB — RPR: RPR: NONREACTIVE

## 2015-04-16 MED ORDER — PENICILLIN G POTASSIUM 5000000 UNITS IJ SOLR
2.5000 10*6.[IU] | INTRAVENOUS | Status: DC
  Administered 2015-04-16: 2.5 10*6.[IU] via INTRAVENOUS
  Filled 2015-04-16 (×5): qty 2.5

## 2015-04-16 MED ORDER — PHENYLEPHRINE 40 MCG/ML (10ML) SYRINGE FOR IV PUSH (FOR BLOOD PRESSURE SUPPORT)
80.0000 ug | PREFILLED_SYRINGE | INTRAVENOUS | Status: DC | PRN
  Filled 2015-04-16: qty 20
  Filled 2015-04-16: qty 2

## 2015-04-16 MED ORDER — TETANUS-DIPHTH-ACELL PERTUSSIS 5-2.5-18.5 LF-MCG/0.5 IM SUSP
0.5000 mL | Freq: Once | INTRAMUSCULAR | Status: DC
Start: 2015-04-17 — End: 2015-04-18

## 2015-04-16 MED ORDER — OXYCODONE HCL 5 MG PO TABS: 10.0000 mg | ORAL_TABLET | ORAL | Status: DC | PRN

## 2015-04-16 MED ORDER — ONDANSETRON HCL 4 MG PO TABS: 4.0000 mg | ORAL_TABLET | ORAL | Status: DC | PRN

## 2015-04-16 MED ORDER — OXYCODONE-ACETAMINOPHEN 5-325 MG PO TABS
2.0000 | ORAL_TABLET | ORAL | Status: DC | PRN
Start: 2015-04-16 — End: 2015-04-16

## 2015-04-16 MED ORDER — ACETAMINOPHEN 325 MG PO TABS: 650.0000 mg | ORAL_TABLET | ORAL | Status: DC | PRN

## 2015-04-16 MED ORDER — OXYCODONE-ACETAMINOPHEN 5-325 MG PO TABS: 1.0000 | ORAL_TABLET | ORAL | Status: DC | PRN

## 2015-04-16 MED ORDER — LACTATED RINGERS IV SOLN
2.5000 [IU]/h | INTRAVENOUS | Status: DC
  Filled 2015-04-16: qty 4

## 2015-04-16 MED ORDER — FENTANYL 2.5 MCG/ML BUPIVACAINE 1/10 % EPIDURAL INFUSION (WH - ANES)
14.0000 mL/h | INTRAMUSCULAR | Status: DC | PRN
Start: 2015-04-16 — End: 2015-04-16
  Administered 2015-04-16: 14 mL/h via EPIDURAL
  Filled 2015-04-16: qty 125

## 2015-04-16 MED ORDER — LIDOCAINE HCL (PF) 1 % IJ SOLN
30.0000 mL | INTRAMUSCULAR | Status: AC | PRN
  Administered 2015-04-16: 30 mL via SUBCUTANEOUS
  Filled 2015-04-16: qty 30

## 2015-04-16 MED ORDER — PENICILLIN G POTASSIUM 5000000 UNITS IJ SOLR
5.0000 10*6.[IU] | Freq: Once | INTRAVENOUS | Status: AC
  Administered 2015-04-16: 5 10*6.[IU] via INTRAVENOUS
  Filled 2015-04-16: qty 5

## 2015-04-16 MED ORDER — ONDANSETRON HCL 4 MG/2ML IJ SOLN: 4.0000 mg | INTRAMUSCULAR | Status: DC | PRN

## 2015-04-16 MED ORDER — ZOLPIDEM TARTRATE 5 MG PO TABS: 5.0000 mg | ORAL_TABLET | Freq: Every evening | ORAL | Status: DC | PRN

## 2015-04-16 MED ORDER — BENZOCAINE-MENTHOL 20-0.5 % EX AERO
1.0000 "application " | INHALATION_SPRAY | CUTANEOUS | Status: DC | PRN
  Administered 2015-04-16: 1 via TOPICAL
  Filled 2015-04-16: qty 56

## 2015-04-16 MED ORDER — LIDOCAINE HCL (PF) 1 % IJ SOLN
INTRAMUSCULAR | Status: DC | PRN
  Administered 2015-04-16 (×2): 4 mL via EPIDURAL

## 2015-04-16 MED ORDER — MEDROXYPROGESTERONE ACETATE 150 MG/ML IM SUSP: 150.0000 mg | INTRAMUSCULAR | Status: DC | PRN

## 2015-04-16 MED ORDER — DIPHENHYDRAMINE HCL 25 MG PO CAPS: 25.0000 mg | ORAL_CAPSULE | Freq: Four times a day (QID) | ORAL | Status: DC | PRN

## 2015-04-16 MED ORDER — DIBUCAINE 1 % RE OINT: 1.0000 "application " | TOPICAL_OINTMENT | RECTAL | Status: DC | PRN

## 2015-04-16 MED ORDER — LACTATED RINGERS IV SOLN
INTRAVENOUS | Status: DC
  Administered 2015-04-16 (×2): via INTRAVENOUS

## 2015-04-16 MED ORDER — TERBUTALINE SULFATE 1 MG/ML IJ SOLN
0.2500 mg | Freq: Once | INTRAMUSCULAR | Status: DC | PRN
  Filled 2015-04-16: qty 1

## 2015-04-16 MED ORDER — OXYCODONE HCL 5 MG PO TABS: 5.0000 mg | ORAL_TABLET | ORAL | Status: DC | PRN

## 2015-04-16 MED ORDER — SENNOSIDES-DOCUSATE SODIUM 8.6-50 MG PO TABS
2.0000 | ORAL_TABLET | ORAL | Status: DC
  Administered 2015-04-16 – 2015-04-17 (×2): 2 via ORAL
  Filled 2015-04-16 (×3): qty 2

## 2015-04-16 MED ORDER — OXYTOCIN BOLUS FROM INFUSION
500.0000 mL | INTRAVENOUS | Status: DC
  Administered 2015-04-16: 500 mL via INTRAVENOUS

## 2015-04-16 MED ORDER — DIPHENHYDRAMINE HCL 50 MG/ML IJ SOLN: 12.5000 mg | INTRAMUSCULAR | Status: DC | PRN

## 2015-04-16 MED ORDER — EPHEDRINE 5 MG/ML INJ
10.0000 mg | INTRAVENOUS | Status: DC | PRN
  Filled 2015-04-16: qty 2

## 2015-04-16 MED ORDER — WITCH HAZEL-GLYCERIN EX PADS: 1.0000 "application " | MEDICATED_PAD | CUTANEOUS | Status: DC | PRN

## 2015-04-16 MED ORDER — SIMETHICONE 80 MG PO CHEW: 80.0000 mg | CHEWABLE_TABLET | ORAL | Status: DC | PRN

## 2015-04-16 MED ORDER — LANOLIN HYDROUS EX OINT: TOPICAL_OINTMENT | CUTANEOUS | Status: DC | PRN

## 2015-04-16 MED ORDER — OXYTOCIN 10 UNIT/ML IJ SOLN
1.0000 m[IU]/min | INTRAVENOUS | Status: DC
  Administered 2015-04-16: 6 m[IU]/min via INTRAVENOUS
  Administered 2015-04-16: 2 m[IU]/min via INTRAVENOUS

## 2015-04-16 MED ORDER — IBUPROFEN 600 MG PO TABS
600.0000 mg | ORAL_TABLET | Freq: Four times a day (QID) | ORAL | Status: DC
  Administered 2015-04-16 – 2015-04-18 (×7): 600 mg via ORAL
  Filled 2015-04-16 (×9): qty 1

## 2015-04-16 MED ORDER — CITRIC ACID-SODIUM CITRATE 334-500 MG/5ML PO SOLN: 30.0000 mL | ORAL | Status: DC | PRN

## 2015-04-16 MED ORDER — LACTATED RINGERS IV SOLN
500.0000 mL | INTRAVENOUS | Status: DC | PRN
  Administered 2015-04-16: 500 mL via INTRAVENOUS

## 2015-04-16 MED ORDER — ONDANSETRON HCL 4 MG/2ML IJ SOLN: 4.0000 mg | Freq: Four times a day (QID) | INTRAMUSCULAR | Status: DC | PRN

## 2015-04-16 MED ORDER — PRENATAL MULTIVITAMIN CH
1.0000 | ORAL_TABLET | Freq: Every day | ORAL | Status: DC
  Administered 2015-04-17 – 2015-04-18 (×2): 1 via ORAL
  Filled 2015-04-16 (×2): qty 1

## 2015-04-16 MED ORDER — ALBUTEROL SULFATE (2.5 MG/3ML) 0.083% IN NEBU: 3.0000 mL | INHALATION_SOLUTION | Freq: Four times a day (QID) | RESPIRATORY_TRACT | Status: DC | PRN

## 2015-04-16 MED ORDER — MEASLES, MUMPS & RUBELLA VAC ~~LOC~~ INJ
0.5000 mL | INJECTION | Freq: Once | SUBCUTANEOUS | Status: DC
Start: 2015-04-17 — End: 2015-04-18

## 2015-04-16 MED ORDER — FLEET ENEMA 7-19 GM/118ML RE ENEM: 1.0000 | ENEMA | RECTAL | Status: DC | PRN

## 2015-04-16 NOTE — Lactation Note (Signed)
This note was copied from the chart of Kathy Jaedah Kuehnel. Lactation Consultation Note Initial visit at 6 hours of age.  Mom reports good feedings and baby has been frequently showing feeding cues.  Mom is trying to eat and baby is showing feeding cues with sucking on lips and tongue with fist to mouth and sticking tongue out.  LC assisted with hand expression and a small drop noted.  Encouraged mom to hand express pre and post feedings.  Mom holding baby in cradle hold and LC assisted with cross cradle hold.  Baby is on and off some with short sucking bursts. [facility] LC resources given and discussed.  Encouraged to feed with early cues on demand.  Early newborn behavior discussed. Mom to call for assist as needed.      Patient Name: Kathy [name] [id number] Date: 04/16/2015 Reason for consult: Initial assessment   Maternal Data Has patient been taught Hand Expression?: Yes Does the patient have breastfeeding experience prior to this delivery?: Yes  Feeding Feeding Type: Breast Fed Length of feed:  (few minutes observed)  LATCH Score/Interventions Latch: Repeated attempts needed to sustain latch, nipple held in mouth throughout feeding, stimulation needed to elicit sucking reflex. Intervention(s): Adjust position;Assist with latch;Breast massage;Breast compression  Audible Swallowing: A few with stimulation  Type of Nipple: Everted at rest and after stimulation  Comfort (Breast/Nipple): Soft / non-tender     Hold (Positioning): Assistance needed to correctly position infant at breast and maintain latch. Intervention(s): Breastfeeding basics reviewed;Support Pillows;Position options;Skin to skin  LATCH Score: 7  Lactation Tools Discussed/Used WIC Program: No   Consult Status Consult Status: Follow-up Date: 04/17/15 Follow-up type: In-patient    Jannifer Rodney 04/16/2015, 8:43 PM

## 2015-04-16 NOTE — H&P (Signed)
Kathy [name] is a 25 y.o. female presenting for IOL due to favorable cervix and fullterm and patient request.  Pregnancy uncomplicated except GBS+. History OB History    Gravida Para Term Preterm AB TAB SAB Ectopic Multiple Living   0 1 0 1 0 0 2     Past Medical History  Diagnosis Date  . Orthostatic hypotension   . Asthma   . Bipolar 1 disorder (HCC)   . Headache(784.0)   . HSV-1 (herpes simplex virus 1) infection   . History of pelvic inflammatory disease   . History of abnormal cervical Pap smear   . History of suicide attempt     06/ 2012  overdose oxycontin  . Pelvic pain in female   . History of ovarian cyst   . Frequency of urination   . Nocturia   . Cystitis, interstitial   . Vaginal Pap smear, abnormal     ok since  . Depression     better now, situational, had pp with last child  . Dysthymic disorder    Past Surgical History  Procedure Laterality Date  . Tonsillectomy  09-10-2007  . Dilation and curettage of uterus  2011    W/ SUCTION  . Cysto with hydrodistension N/A 02/25/2014    Procedure: CYSTOSCOPY/HYDRODISTENSION WITH INSTILLATION;  Surgeon: Martina Sinner, MD;  Location: [facility];  Service: Urology;  Laterality: N/A;   Family History: family history includes Breast cancer in her maternal grandmother. There is no history of Anesthesia problems. She was adopted. Social History:  reports that she quit smoking about 12 months ago. Her smoking use included Cigarettes. She has a .075 pack-year smoking history. She has never used smokeless tobacco. She reports that she does not drink alcohol or use illicit drugs.   Prenatal Transfer Tool  Maternal Diabetes: No Genetic Screening: Normal Maternal Ultrasounds/Referrals: Normal Fetal Ultrasounds or other Referrals:  None Maternal Substance Abuse:  No Significant Maternal Medications:  None Significant Maternal Lab Results:  None Other Comments:  None  ROS    Height   (1.626 m), weight 234 lb (106.142 kg), last menstrual period 07/16/2014. Exam Physical Exam   Prev 2-3/50/-3 Prenatal labs: ABO, Rh: A/Positive/-- (06/21 0000) Antibody: Negative (06/21 0000) Rubella: Immune (06/21 0000) RPR: Nonreactive (06/21 0000)  HBsAg: Negative (06/21 0000)  HIV: Non-reactive (06/21 0000)  GBS: Positive (12/28 0000)   Assessment/Plan: IUP at Term IOL Fav cx - IV PCN for GBS then AROM and anticipate SVD   Fintan Grater C 04/16/2015, 8:12 AM

## 2015-04-16 NOTE — Anesthesia Preprocedure Evaluation (Signed)
Anesthesia Evaluation  Patient identified by MRN, date of birth, ID band Patient awake    Reviewed: Allergy & Precautions, NPO status , Patient's Chart, lab work & pertinent test results  Airway Mallampati: III  TM Distance: >3 FB Neck ROM: Full    Dental  (+) Teeth Intact   Pulmonary asthma , former smoker,    breath sounds clear to auscultation       Cardiovascular negative cardio ROS   Rhythm:Regular Rate:Normal     Neuro/Psych  Headaches, PSYCHIATRIC DISORDERS Anxiety Depression Bipolar Disorder    GI/Hepatic negative GI ROS, Neg liver ROS,   Endo/Other  negative endocrine ROS  Renal/GU negative Renal ROS  negative genitourinary   Musculoskeletal negative musculoskeletal ROS (+)   Abdominal   Peds negative pediatric ROS (+)  Hematology negative hematology ROS (+)   Anesthesia Other Findings   Reproductive/Obstetrics negative OB ROS                             Lab Results  Component Value Date   WBC 11.4* 04/16/2015   HGB 10.9* 04/16/2015   HCT 34.1* 04/16/2015   MCV 84.6 04/16/2015   PLT 212 04/16/2015   No results found for: INR, PROTIME   Anesthesia Physical Anesthesia Plan  ASA: III  Anesthesia Plan: Epidural   Post-op Pain Management:    Induction:   Airway Management Planned:   Additional Equipment:   Intra-op Plan:   Post-operative Plan:   Informed Consent: I have reviewed the patients History and Physical, chart, labs and discussed the procedure including the risks, benefits and alternatives for the proposed anesthesia with the patient or authorized representative who has indicated his/her understanding and acceptance.     Plan Discussed with:   Anesthesia Plan Comments:         Anesthesia Quick Evaluation

## 2015-04-16 NOTE — Anesthesia Procedure Notes (Signed)
Epidural Patient location during procedure: OB Start time: 04/16/2015 1:42 PM End time: 04/16/2015 1:49 PM  Staffing Anesthesiologist: Shona Simpson D Performed by: anesthesiologist   Preanesthetic Checklist Completed: patient identified, site marked, surgical consent, pre-op evaluation, timeout performed, IV checked, risks and benefits discussed and monitors and equipment checked  Epidural Patient position: sitting Prep: ChloraPrep Patient monitoring: heart rate, continuous pulse ox and blood pressure Approach: midline Location: L3-L4 Injection technique: LOR saline  Needle:  Needle type: Tuohy  Needle gauge: 17 G Needle length: 9 cm Catheter type: closed end flexible Catheter size: 20 Guage Test dose: negative and 1.5% lidocaine  Assessment Events: blood not aspirated, injection not painful, no injection resistance and no paresthesia  Additional Notes LOR @ 7  Patient identified. Risks/Benefits/Options discussed with patient including but not limited to bleeding, infection, nerve damage, paralysis, failed block, incomplete pain control, headache, blood pressure changes, nausea, vomiting, reactions to medications, itching and postpartum back pain. Confirmed with bedside nurse the patient's most recent platelet count. Confirmed with patient that they are not currently taking any anticoagulation, have any bleeding history or any family history of bleeding disorders. Patient expressed understanding and wished to proceed. All questions were answered. Sterile technique was used throughout the entire procedure. Please see nursing notes for vital signs. Test dose was given through epidural catheter and negative prior to continuing to dose epidural or start infusion. Warning signs of high block given to the patient including shortness of breath, tingling/numbness in hands, complete motor block, or any concerning symptoms with instructions to call for help. Patient was given instructions on  fall risk and not to get out of bed. All questions and concerns addressed with instructions to call with any issues or inadequate analgesia.    Reason for block:procedure for pain

## 2015-04-17 LAB — CBC
HEMATOCRIT: 32.6 % — AB (ref 36.0–46.0)
Hemoglobin: 10.2 g/dL — ABNORMAL LOW (ref 12.0–15.0)
MCH: 26.8 pg (ref 26.0–34.0)
MCHC: 31.3 g/dL (ref 30.0–36.0)
MCV: 85.6 fL (ref 78.0–100.0)
PLATELETS: 194 10*3/uL (ref 150–400)
RBC: 3.81 MIL/uL — ABNORMAL LOW (ref 3.87–5.11)
RDW: 15.4 % (ref 11.5–15.5)
WBC: 12.3 10*3/uL — AB (ref 4.0–10.5)

## 2015-04-17 NOTE — Anesthesia Postprocedure Evaluation (Signed)
Anesthesia Post Note  Patient: Kathy [name]  Procedure(s) Performed: * No procedures listed *  Patient location during evaluation: Mother Baby Anesthesia Type: Epidural Level of consciousness: awake and alert and oriented Pain management: satisfactory to patient Vital Signs Assessment: post-procedure vital signs reviewed and stable Respiratory status: spontaneous breathing and nonlabored ventilation Cardiovascular status: stable Postop Assessment: no headache, no backache, no signs of nausea or vomiting, adequate PO intake and patient able to bend at knees (patient up walking) Anesthetic complications: no    Last Vitals:  Filed Vitals:   04/16/15 2136 04/17/15 0508  BP: 104/51 102/53  Pulse: 70 68  Temp: 36.7 C 36.7 C  Resp: 18 18    Last Pain:  Filed Vitals:   04/17/15 0733  PainSc: 3                  Jessicca Stitzer

## 2015-04-17 NOTE — Lactation Note (Signed)
This note was copied from the chart of Kathy Destry Avellino. Lactation Consultation Note  Patient Name: Kathy [name] [id number] Date: 04/17/2015 Reason for consult: Follow-up assessment Baby at 28 hr of life and mom reports that feedings are going well. She denies breast or nipple pain, no concerns voiced. She stated that sometimes the baby pulls her lips in when she is latching but she can "pop" them out once that baby gets on. She has a DEBP at home and knows how to manually express. Her mother is going to watch her older children for a week so she can the baby can spend time together. She is aware of OP services and support group. She will call as needed.   Maternal Data    Feeding Feeding Type: Breast Fed Length of feed: 20 min  LATCH Score/Interventions                      Lactation Tools Discussed/Used     Consult Status Consult Status: Follow-up Date: 04/18/15 Follow-up type: In-patient    Rulon Eisenmenger 04/17/2015, 6:19 PM

## 2015-04-17 NOTE — Clinical Social Work Maternal (Signed)
CLINICAL SOCIAL WORK MATERNAL/CHILD NOTE  Patient Details  Name: Kathy [name] MRN: [id number] Date of Birth: 12/13/1990  Date:  04/17/2015  Clinical Social Worker Initiating Note:  Jaasiel Hollyfield MSW, LCSW Date/ Time Initiated:  04/17/15/1015    Child's Name:  Audrey   Legal Guardian:  Mother   Need for Interpreter:  None   Date of Referral:  04/16/15     Reason for Referral:  Behavioral Health Issues, including SI    Referral Source:  Central Nursery   Address:  [address] , [place] [address]  Phone number:  [phone]   Household Members:  Minor Children   Natural Supports (not living in the home):  Immediate Family   Professional Supports: Therapist at [facility]   Employment: Full-time   Type of Work:     Education:      Financial Resources:  Medicaid, Private Insurance   Other Resources:      Cultural/Religious Considerations Which May Impact Care:  None reported  Strengths:  Ability to meet basic needs , Pediatrician chosen , Home prepared for child    Risk Factors/Current Problems:   1. Mental Health Concerns: MOB presents with a history of anxiety, depression, bipolar, and PPD.  MOB clarified that most recent diagnosis include PTSD and anxiety, but confirmed history of PPD after her second child was born.   Cognitive State:  Able to Concentrate , Alert , Linear Thinking , Insightful    Mood/Affect:  Bright , Comfortable , Happy    CSW Assessment:  CSW received request for consult due to MOB presenting with a history of anxiety and depression. Prior to meeting with MOB, CSW completed chart review. Per chart review, MOB presents with history depression and bipolar since age 14.  MOB was evaluated at [facility] for a walk-in assessment in July 2016 due to MOB presenting with intrusive thoughts of wanting to harm herself.    MOB presented as easily engaged and receptive to visit with CSW. She displayed a full range in  affect and was in a pleasant mood.  MOB openly discussed her previous mental health history, and presented with insight and self-awareness.  MOB denied questions, concerns, or needs secondary to her mental health at this time. She stated that there is a close connection between her mental health and the FOB.  Per MOB, she has a history of a highly strained relationship with the father of her children, and shared that her mental health is better and stable when he is not involved.  Per MOB, she was diagnosed during the pregnancy with anxiety and PTSD, but stated that all symptoms are situational to the FOB.  MOB reported that she attended therapy at [facility] and found it beneficial and helpful since it helped provide clarity to her mental health and offer solutions to help her manage and cope.  MOB reflected upon CBT techniques that assisted her to adjust and change her approach to her thoughts and feelings.  Per MOB, the FOB is not involved, and she identifies this as positive for her and her children.    MOB confirmed that she requested an evaluation at [facility] in July 2016 due to intrusive thoughts. She stated that she was having visions of wanting to harm herself, and sought out help since she knew that there was something wrong with these thoughts.  MOB stated that the day of the evaluation, the FOB left.  She reported that she experienced no subsequent intrusive thoughts, and stated   that she feels strongly and asking for help when she knows that something is wrong.   MOB confirmed history of postpartum depression after her second child was born. She shared that she did not experience PPD with her first child, but discussed belief that it was due to the FOB not being involved. MOB reported that after second child was born, they were arguing all the time (including at the hospital) he was living in the home, and it led to additional stress.  MOB expressed hope and feelings of optimism that she will  not experience PPD with this pregnancy since he is not involved and she has increased her ability to self-regulate and cope.  MOB expressed ability to reach out to her previous therapist and re-start therapy if needed.  MOB also discussed goals of re-starting "clean eating" habits, sleeping, and exercising in order to support her mental health.  MOB declined need for medications at this time, but acknowledged availability of medication ,management if needed.   MOB smiled and appeared proud of herself as she reflected upon the positive changes in her mental health. She discussed at length how she feels fulfilled as a mother, and has enjoyed being a positive role model for them. MOB stated that she has the support of her family, and she knows that she is not alone.   MOB expressed appreciation for the visit and support. She denied questions, concerns, or needs at this time, but agreed to contact CSW if needs arise.   CSW Plan/Description:   1. Patient/Family Education-- perinatal mood and anxiety disorders 2. No Further Intervention Required/No Barriers to Discharge    Peggy Loge N, LCSW 04/17/2015, 1:10 PM  

## 2015-04-17 NOTE — Progress Notes (Signed)
Post Partum Day 1 Subjective: no complaints, up ad lib, voiding, tolerating PO and + flatus  Objective: Blood pressure 102/53, pulse 68, temperature 98.1 F (36.7 C), temperature source Oral, resp. rate 18, height  (1.626 m), weight 234 lb (106.142 kg), last menstrual period 07/16/2014, SpO2 100 %, unknown if currently breastfeeding.  Physical Exam:  General: alert and cooperative Lochia: appropriate Uterine Fundus: firm Incision: healing well DVT Evaluation: No evidence of DVT seen on physical exam. Negative Homan's sign. No cords or calf tenderness.   Recent Labs  04/16/15 0805 04/17/15 0505  HGB 10.9* 10.2*  HCT 34.1* 32.6*    Assessment/Plan: Plan for discharge tomorrow   LOS: 1 day   CURTIS,CAROL G 04/17/2015, 8:05 AM

## 2015-04-18 MED ORDER — IBUPROFEN 600 MG PO TABS: 600.0000 mg | ORAL_TABLET | Freq: Four times a day (QID) | ORAL | Status: DC

## 2015-04-18 MED ORDER — OXYCODONE HCL 5 MG PO TABS: 5.0000 mg | ORAL_TABLET | ORAL | Status: DC | PRN

## 2015-04-18 NOTE — Discharge Instructions (Signed)
Call MD for T>100.4, heavy vaginal bleeding, severe abdominal pain, or respiratory distress.  Call office to schedule postpartum visit in 4-6 weeks.  No driving while taking narcotics.  Pelvic rest x 6 weeks. °

## 2015-04-18 NOTE — Progress Notes (Signed)
Post Partum Day 2 Subjective: no complaints, up ad lib, voiding, tolerating PO and + flatus  Objective: Blood pressure 96/57, pulse 61, temperature 98 F (36.7 C), temperature source Oral, resp. rate 16, height  (1.626 m), weight 106.142 kg (234 lb), last menstrual period 07/16/2014, SpO2 98 %, unknown if currently breastfeeding.  Physical Exam:  General: alert, cooperative and appears stated age Lochia: appropriate Uterine Fundus: firm Incision: healing well, no significant drainage, no dehiscence DVT Evaluation: No evidence of DVT seen on physical exam. Negative Homan's sign. No cords or calf tenderness.   Recent Labs  04/16/15 0805 04/17/15 0505  HGB 10.9* 10.2*  HCT 34.1* 32.6*    Assessment/Plan: Discharge home and Breastfeeding   LOS: 2 days   Odena Mcquaid 04/18/2015, 8:42 AM

## 2015-04-18 NOTE — Discharge Summary (Signed)
Obstetric Discharge Summary Reason for Admission: induction of labor Prenatal Procedures: none Intrapartum Procedures: spontaneous vaginal delivery Postpartum Procedures: none Complications-Operative and Postpartum: 1st degree perineal laceration HEMOGLOBIN  Date Value Ref Range Status  04/17/2015 10.2* 12.0 - 15.0 g/dL Final   HCT  Date Value Ref Range Status  04/17/2015 32.6* 36.0 - 46.0 % Final    Physical Exam:  General: alert, cooperative and appears stated age Lochia: appropriate Uterine Fundus: firm Incision: healing well, no significant drainage, no dehiscence DVT Evaluation: No evidence of DVT seen on physical exam. Negative Homan's sign. No cords or calf tenderness.  Discharge Diagnoses: Term Pregnancy-delivered  Discharge Information: Date: 04/18/2015 Activity: pelvic rest Diet: routine Medications: PNV, Ibuprofen and Percocet Condition: stable Instructions: refer to practice specific booklet Discharge to: home   Newborn Data: Live born female  Birth Weight: 7 lb 11.1 oz (3490 g) APGAR: 9, 9  Home with mother.  Kathy [name] 04/18/2015, 8:46 AM

## 2015-06-18 DIAGNOSIS — R001 Bradycardia, unspecified: Secondary | ICD-10-CM | POA: Insufficient documentation

## 2015-06-18 DIAGNOSIS — J45909 Unspecified asthma, uncomplicated: Secondary | ICD-10-CM | POA: Insufficient documentation

## 2015-06-18 DIAGNOSIS — R1310 Dysphagia, unspecified: Secondary | ICD-10-CM | POA: Insufficient documentation

## 2015-06-18 DIAGNOSIS — F0630 Mood disorder due to known physiological condition, unspecified: Secondary | ICD-10-CM | POA: Insufficient documentation

## 2015-06-18 DIAGNOSIS — I8393 Asymptomatic varicose veins of bilateral lower extremities: Secondary | ICD-10-CM | POA: Insufficient documentation

## 2015-06-18 DIAGNOSIS — G44209 Tension-type headache, unspecified, not intractable: Secondary | ICD-10-CM | POA: Insufficient documentation

## 2015-06-18 DIAGNOSIS — F988 Other specified behavioral and emotional disorders with onset usually occurring in childhood and adolescence: Secondary | ICD-10-CM | POA: Insufficient documentation

## 2015-06-18 DIAGNOSIS — F319 Bipolar disorder, unspecified: Secondary | ICD-10-CM | POA: Insufficient documentation

## 2015-06-18 DIAGNOSIS — K219 Gastro-esophageal reflux disease without esophagitis: Secondary | ICD-10-CM | POA: Insufficient documentation

## 2015-08-03 IMAGING — US US OB TRANSVAGINAL
1 series · 13 of 28 positions shown · non-contrast
Comparison: None.

CLINICAL DATA: Acute onset of vaginal bleeding and right lower
quadrant abdominal pain. Initial encounter.

EXAM:
OBSTETRIC <14 WK US AND TRANSVAGINAL OB US
TECHNIQUE: Both transabdominal and transvaginal ultrasound examinations were
performed for complete evaluation of the gestation as well as the
maternal uterus, adnexal regions, and pelvic cul-de-sac.
Transvaginal technique was performed to assess early pregnancy.

[Series 1: us ob comp less 14 wk · 13 of 68 slices shown]
[im 3/68]
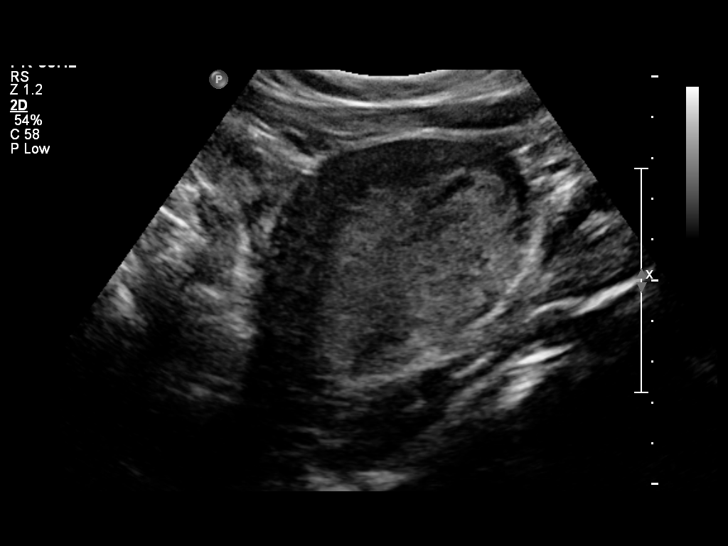
[im 8/68]
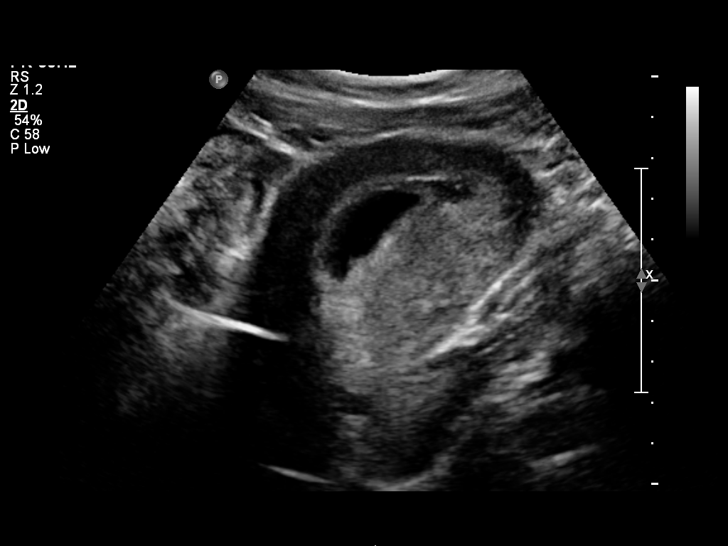
[im 13/68]
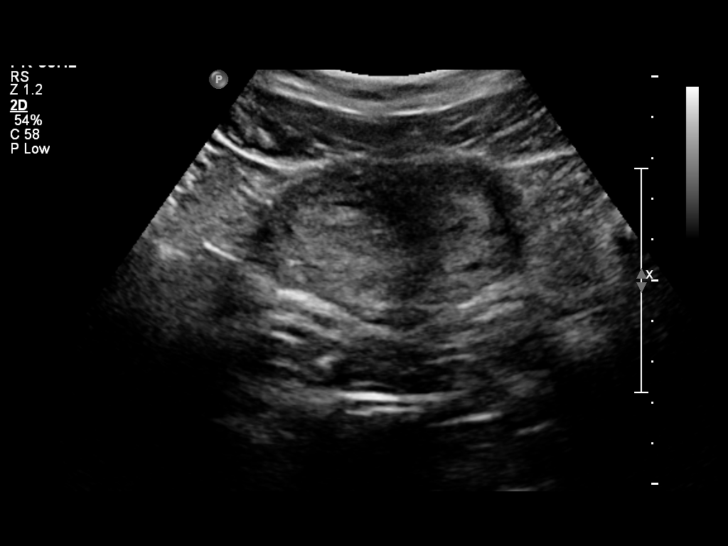
[im 18/68]
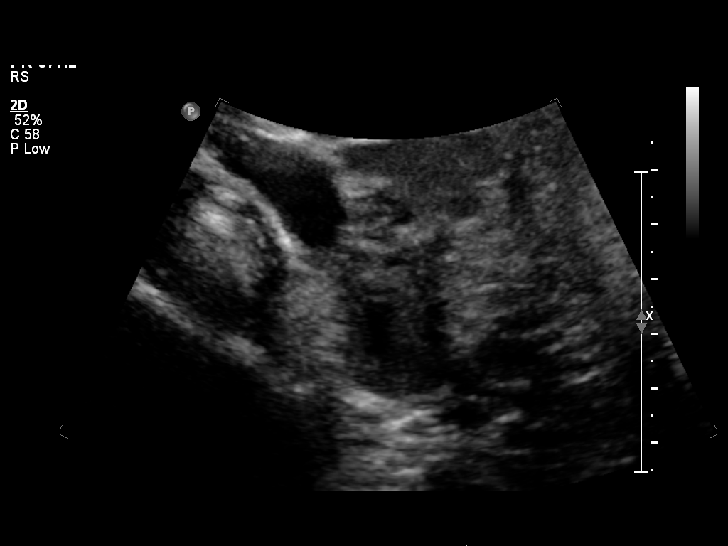
[im 23/68]
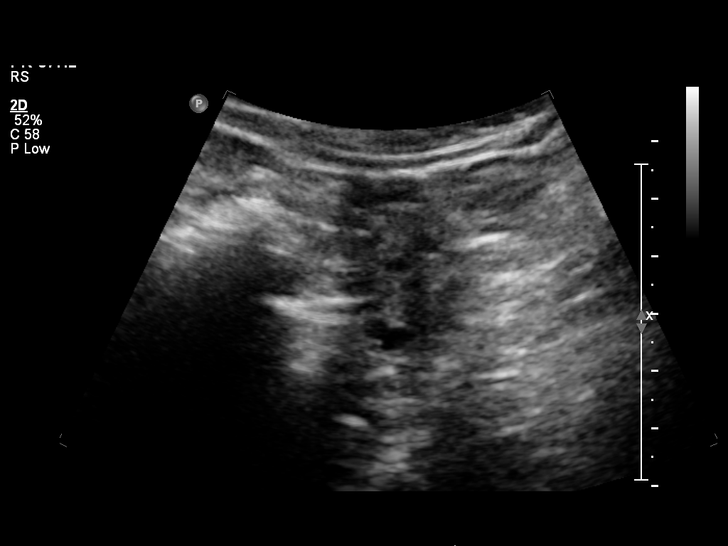
[im 28/68]
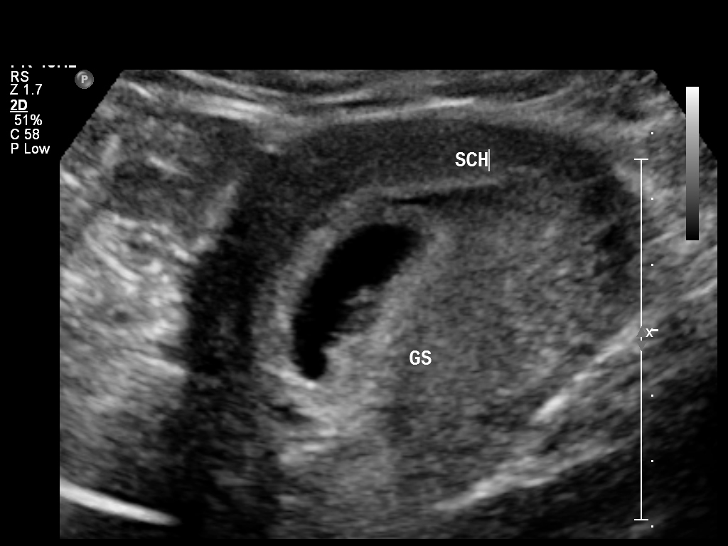
[im 35/68]
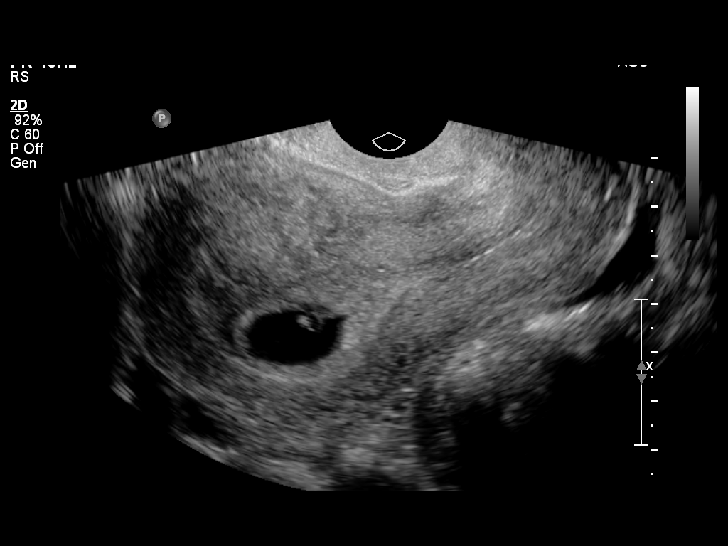
[im 40/68]
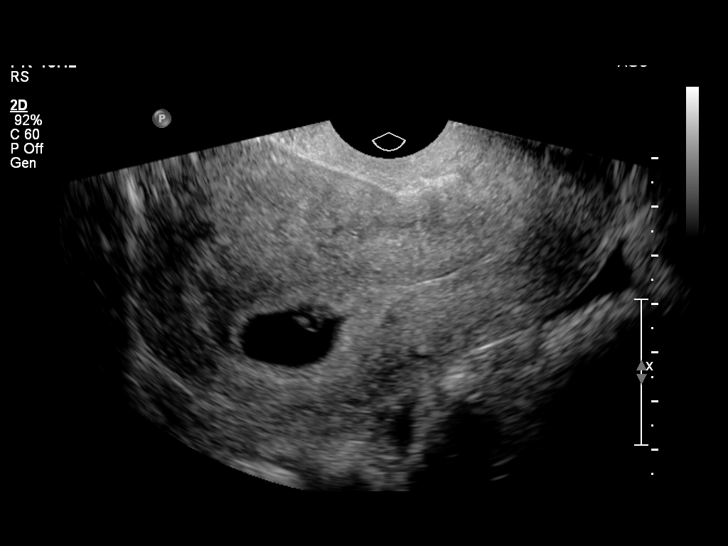
[im 45/68]
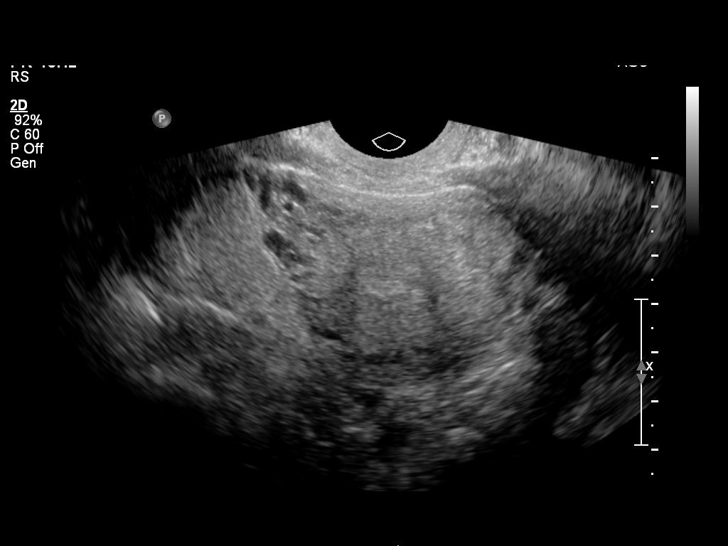
[im 50/68]
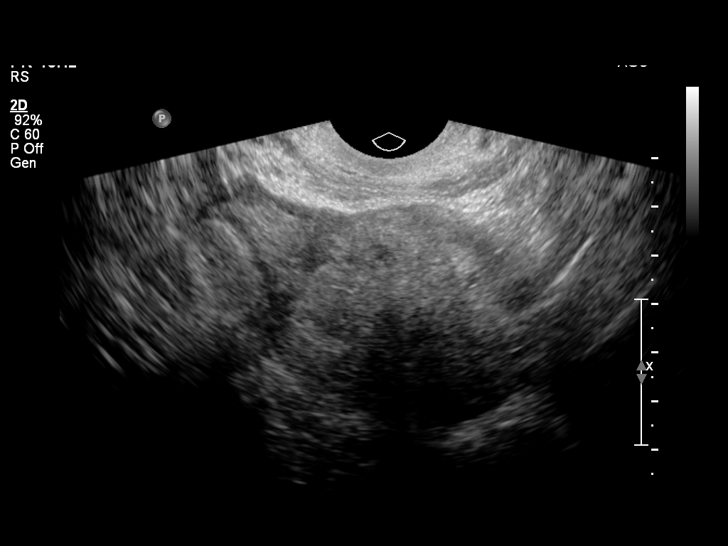
[im 55/68]
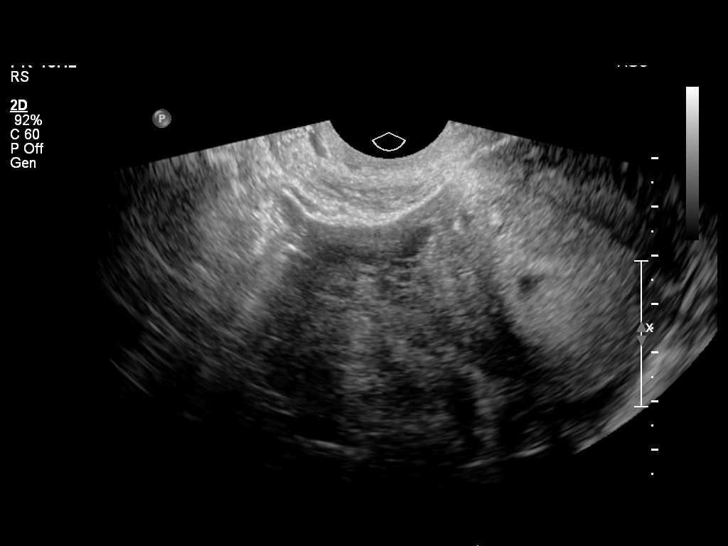
[im 60/68]
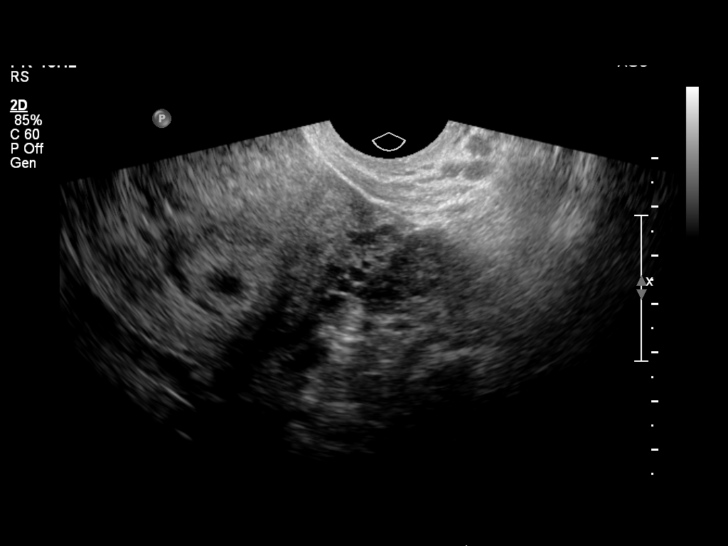
[im 65/68]
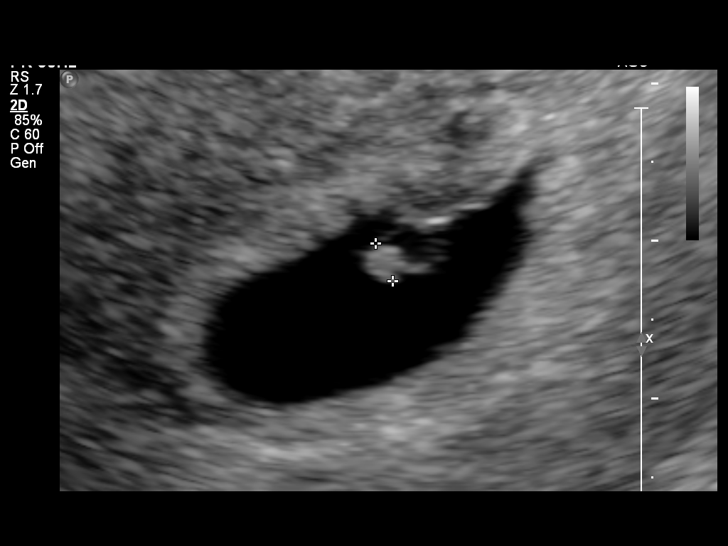

[13 of 28 positions shown; findings below may reference images not displayed]

FINDINGS: Intrauterine gestational sac: Visualized/normal in shape.

Yolk sac:  Yes

Embryo:  Yes

Cardiac Activity: Yes

Heart Rate: 118  bpm

CRL:  2.4  mm   5 w   6 d                  US EDC: 04/24/2015

Maternal uterus/adnexae: A small amount of subchorionic hemorrhage
is noted. The uterus is otherwise unremarkable in appearance.

The ovaries are within normal limits. The right ovary measures 3.8 x
2.9 by 2.2 cm, while the left ovary measures 2.2 x 1.6 x 1.4 cm. No
suspicious adnexal masses are seen; there is no evidence for ovarian
torsion.

No free fluid is seen within the pelvic cul-de-sac.
IMPRESSION: 1. Single live intrauterine pregnancy noted, with a crown-rump
length of 2.4 mm, corresponding to a gestational age of 5 weeks 6
days. This matches the gestational age of 6 weeks 1 day by LMP,
reflecting an estimated date of delivery April 22, 2015.
2. Small amount of subchorionic hemorrhage noted.

## 2015-08-24 ENCOUNTER — Emergency Department (HOSPITAL_COMMUNITY)
Admission: EM | Admit: 2015-08-24 | Discharge: 2015-08-25 | Disposition: A | Discharge status: Home / Self Care | Payer: BLUE CROSS/BLUE SHIELD | Attending: Emergency Medicine | Admitting: Emergency Medicine

## 2015-08-24 ENCOUNTER — Encounter (HOSPITAL_COMMUNITY): Payer: Self-pay | Admitting: Emergency Medicine

## 2015-08-24 DIAGNOSIS — L509 Urticaria, unspecified: Secondary | ICD-10-CM

## 2015-08-24 DIAGNOSIS — R21 Rash and other nonspecific skin eruption: Secondary | ICD-10-CM | POA: Diagnosis present

## 2015-08-24 DIAGNOSIS — J45909 Unspecified asthma, uncomplicated: Secondary | ICD-10-CM | POA: Insufficient documentation

## 2015-08-24 DIAGNOSIS — Z91040 Latex allergy status: Secondary | ICD-10-CM | POA: Insufficient documentation

## 2015-08-24 DIAGNOSIS — Z87891 Personal history of nicotine dependence: Secondary | ICD-10-CM | POA: Diagnosis not present

## 2015-08-24 DIAGNOSIS — F319 Bipolar disorder, unspecified: Secondary | ICD-10-CM | POA: Diagnosis not present

## 2015-08-24 NOTE — ED Notes (Signed)
Pt BIB EMS; pt states around 1630 she began feeling itchy and having a generalized rash on her mid-back; pt took a benadryl at this time and at 1930; the benadryl was effective until 2230 when she noticed the rash had returned and she had developed hives and itching and a tight chest; pt took another benadryl then; pt has had total of 75 mg of benadryl; pt denies N/V/D and chest pain; pt has hx of anxiety and has a known cat allergy but denies interaction with cats; NAD noted.

## 2015-08-25 MED ORDER — PREDNISONE 50 MG PO TABS: 50.0000 mg | ORAL_TABLET | Freq: Every day | ORAL | Status: DC

## 2015-08-25 MED ORDER — DIPHENHYDRAMINE HCL 50 MG/ML IJ SOLN
25.0000 mg | Freq: Once | INTRAMUSCULAR | Status: AC
  Administered 2015-08-25: 25 mg via INTRAVENOUS
  Filled 2015-08-25: qty 1

## 2015-08-25 MED ORDER — FAMOTIDINE IN NACL 20-0.9 MG/50ML-% IV SOLN
20.0000 mg | Freq: Once | INTRAVENOUS | Status: AC
  Administered 2015-08-25: 20 mg via INTRAVENOUS
  Filled 2015-08-25: qty 50

## 2015-08-25 NOTE — ED Notes (Signed)
Pt ambulated to restroom. 

## 2015-08-25 NOTE — ED Provider Notes (Signed)
CSN: [id number]     Arrival date & time 08/24/15  2348 History  By signing my name below, I, Kathy [name], attest that this documentation has been prepared under the direction and in the presence of Dione Boozeavid Angee Gupton, MD. Electronically Signed: Randell PatientMarrissa [name], ED Scribe. 08/25/2015. 12:27 AM.   No chief complaint on file.   The history is provided by the patient. No language interpreter was used.  HPI Comments: Kathy [name] is a 25 y.o. female with an hx of anxiety and asthma who presents to the Emergency Department complaining of constant, gradually improving, pruritic rash that began 8 hours ago. Pt states that she came inside from outdoors when she became itchy all over followed by the appearance of a diffuse, red rash, worse on her BUE and abdomen, and then by chest tightness (completely resolved). She has taken Benadryl with temporary relief of her itching only, scratched and applied hydrocortisone cream without relief, and taken steroids from EMS en route to the ED tonight with slight relief of her rash and pruritis. She has worn new unwashed clothing in the past 2 days and recently changed both her detergent and her deodorant. Denies any other symptoms currently.  Past Medical History  Diagnosis Date  . Orthostatic hypotension   . Asthma   . Bipolar 1 disorder (HCC)   . Headache(784.0)   . HSV-1 (herpes simplex virus 1) infection   . History of pelvic inflammatory disease   . History of abnormal cervical Pap smear   . History of suicide attempt     06/ 2012  overdose oxycontin  . Pelvic pain in female   . History of ovarian cyst   . Frequency of urination   . Nocturia   . Cystitis, interstitial   . Vaginal Pap smear, abnormal     ok since  . Depression     better now, situational, had pp with last child  . Dysthymic disorder    Past Surgical History  Procedure Laterality Date  . Tonsillectomy  09-10-2007  . Dilation and curettage of uterus  2011    W/ SUCTION   . Cysto with hydrodistension N/A 02/25/2014    Procedure: CYSTOSCOPY/HYDRODISTENSION WITH INSTILLATION;  Surgeon: Martina SinnerScott A MacDiarmid, MD;  [facility];  Service: Urology;  Laterality: N/A;   Family History  Problem Relation Age of Onset  . Adopted: Yes  . Anesthesia problems Neg Hx   . Breast cancer Maternal Grandmother    Social History  Substance Use Topics  . Smoking status: Former Smoker -- 0.25 packs/day for .3 years    Types: Cigarettes    Quit date: 03/28/2014  . Smokeless tobacco: Never Used  . Alcohol Use: No     Comment: rare   OB History    Gravida Para Term Preterm AB TAB SAB Ectopic Multiple Living   4 3 3  0 1 0 1 0 0 3     Review of Systems  Respiratory: Negative for chest tightness (resolved).   Skin: Positive for color change and rash.  All other systems reviewed and are negative.     Allergies  Diflucan and Latex  Home Medications   Prior to Admission medications   Medication Sig Start Date End Date Taking? Authorizing Provider  albuterol (PROVENTIL HFA;VENTOLIN HFA) 108 (90 BASE) MCG/ACT inhaler Inhale 2 puffs into the lungs every 6 (six) hours as needed for wheezing or shortness of breath (emergency asthma attacks).     Historical Provider, MD  B Complex Vitamins (B-COMPLEX/B-12) TABS Take 1 tablet by mouth daily.    Historical Provider, MD  calcium carbonate (TUMS - DOSED IN MG ELEMENTAL CALCIUM) 500 MG chewable tablet Chew 1 tablet by mouth 2 (two) times daily as needed for indigestion or heartburn.    Historical Provider, MD  ibuprofen (ADVIL,MOTRIN) 600 MG tablet Take 1 tablet (600 mg total) by mouth every 6 (six) hours. 04/18/15   Megan Morris, DO  IRON PO Take 1 tablet by mouth daily.    Historical Provider, MD  oxyCODONE (OXY IR/ROXICODONE) 5 MG immediate release tablet Take 1 tablet (5 mg total) by mouth every 4 (four) hours as needed (pain scale 4-7). 04/18/15   Mitchel Honour, DO  Prenatal Vit-Fe Fumarate-FA (PRENATAL  MULTIVITAMIN) TABS tablet Take 1 tablet by mouth daily at 12 noon.     Historical Provider, MD   BP 109/67 mmHg  Pulse 71  Temp(Src) 98.8 F (37.1 C) (Oral)  Resp 18  SpO2 99% Physical Exam  Constitutional: She is oriented to person, place, and time. She appears well-developed and well-nourished. No distress.  HENT:  Head: Normocephalic and atraumatic.  No difficulty with secretions. Normal phonation. No stridor. No mucosal lesions.  Eyes: Conjunctivae and EOM are normal. Pupils are equal, round, and reactive to light.  Neck: Normal range of motion. Neck supple. No JVD present.  Cardiovascular: Normal rate, regular rhythm and normal heart sounds.   No murmur heard. Pulmonary/Chest: Effort normal and breath sounds normal. No stridor. She has no wheezes. She has no rales. She exhibits no tenderness.  Abdominal: Soft. Bowel sounds are normal. She exhibits no distension and no mass. There is no guarding.  Musculoskeletal: Normal range of motion. She exhibits no edema.  Lymphadenopathy:    She has no cervical adenopathy.  Neurological: She is alert and oriented to person, place, and time. No cranial nerve deficit. She exhibits normal muscle tone. Coordination normal.  Skin: Skin is warm and dry. Rash noted. Rash is urticarial.  Generalized urticarial rash with confluence over the trunk.  Psychiatric: She has a normal mood and affect. Her behavior is normal. Judgment and thought content normal.  Nursing note and vitals reviewed.   ED Course  Procedures  DIAGNOSTIC STUDIES: Oxygen Saturation is 99% on RA, normal by my interpretation.    COORDINATION OF CARE: 12:09 AM Will monitor in ED. Will order Benadryl and Pepcid. Advised pt to discontinue use of new detergents and deodorants and restart use of her old detergent and deodorant. Discussed treatment plan with pt at bedside and pt agreed to plan.    MDM   Final diagnoses:  Urticaria    Urticaria of uncertain cause. Patient had  several recent changes including new detergents, new clothes, new deodorant. She changed her laundry detergent 2 weeks ago, and that timing would be about right to develop an allergy. Uncertainty of knowing what was specifically triggering the reaction was inflamed the patient. She has already received methylprednisolone in the ambulance coming to the ED. She's given additional diphenhydramine and famotidine. Old records are reviewed and they're she has no relevant past visits.  She had good relief of symptoms with above noted treatment. On reevaluation, rash was fading. She is discharged with prescription for prednisone. She is advised to use over-the-counter nonsedating antihistamines for the next 2 weeks. She is to take over-the-counter H2 blockers along with her antihistamine. He is diphenhydramine as needed for breakthrough itching. Prescription given for prednisone. Advised to return to her previous deodorant  and laundry detergent and to wash her new close before wearing them. Cautious reintroduction of new agents after complete clearing of current episode of urticaria.  I personally performed the services described in this documentation, which was scribed in my presence. The recorded information has been reviewed and is accurate.      Dione Booze, MD 08/25/15 [phone]

## 2015-08-25 NOTE — Discharge Instructions (Signed)
Take loratadine (Claritin) or cetirizine (Zyrtec) once a day for the next 2 weeks. Take famotidine (Pepcid AC) or ranitidine (Zantac) twice a day. Take diphenhydramine (Benadryl) every 4 hours as needed for itching not controlled with the other medications.  Stop using your new deodorant. Stop using your new detergent. Anything that was washed and that detergent needs to be washed again with year old detergent. Wash your new clothes before wearing them.  Once your hives have resolved, you can try adding back the new detergent and the new deodorant, but wait 2 weeks between when you add them. If you don't break out in a rash when you add the detergent and deodorant back, then that is not the cause of your hives.   Hives Hives are itchy, red, swollen areas of the skin. They can vary in size and location on your body. Hives can come and go for hours or several days (acute hives) or for several weeks (chronic hives). Hives do not spread from person to person (noncontagious). They may get worse with scratching, exercise, and emotional stress. CAUSES   Allergic reaction to food, additives, or drugs.  Infections, including the common cold.  Illness, such as vasculitis, lupus, or thyroid disease.  Exposure to sunlight, heat, or cold.  Exercise.  Stress.  Contact with chemicals. SYMPTOMS   Red or white swollen patches on the skin. The patches may change size, shape, and location quickly and repeatedly.  Itching.  Swelling of the hands, feet, and face. This may occur if hives develop deeper in the skin. DIAGNOSIS  Your caregiver can usually tell what is wrong by performing a physical exam. Skin or blood tests may also be done to determine the cause of your hives. In some cases, the cause cannot be determined. TREATMENT  Mild cases usually get better with medicines such as antihistamines. Severe cases may require an emergency epinephrine injection. If the cause of your hives is known,  treatment includes avoiding that trigger.  HOME CARE INSTRUCTIONS   Avoid causes that trigger your hives.  Take antihistamines as directed by your caregiver to reduce the severity of your hives. Non-sedating or low-sedating antihistamines are usually recommended. Do not drive while taking an antihistamine.  Take any other medicines prescribed for itching as directed by your caregiver.  Wear loose-fitting clothing.  Keep all follow-up appointments as directed by your caregiver. SEEK MEDICAL CARE IF:   You have persistent or severe itching that is not relieved with medicine.  You have painful or swollen joints. SEEK IMMEDIATE MEDICAL CARE IF:   You have a fever.  Your tongue or lips are swollen.  You have trouble breathing or swallowing.  You feel tightness in the throat or chest.  You have abdominal pain. These problems may be the first sign of a life-threatening allergic reaction. Call your local emergency services (911 in [place] MAKE SURE YOU:   Understand these instructions.  Will watch your condition.  Will get help right away if you are not doing well or get worse.   This information is not intended to replace advice given to you by your health care provider. Make sure you discuss any questions you have with your health care provider.   Document Released: 03/14/2005 Document Revised: 03/19/2013 Document Reviewed: 06/07/2011 Elsevier Interactive Patient Education 2016 Elsevier Inc.  Prednisone tablets What is this medicine? PREDNISONE (PRED ni sone) is a corticosteroid. It is commonly used to treat inflammation of the skin, joints, lungs, and other organs. Common conditions treated  include asthma, allergies, and arthritis. It is also used for other conditions, such as blood disorders and diseases of the adrenal glands. This medicine may be used for other purposes; ask your health care provider or pharmacist if you have questions. What should I tell my health care  provider before I take this medicine? They need to know if you have any of these conditions: -Cushing's syndrome -diabetes -glaucoma -heart disease -high blood pressure -infection (especially a virus infection such as chickenpox, cold sores, or herpes) -kidney disease -liver disease -mental illness -myasthenia gravis -osteoporosis -seizures -stomach or intestine problems -thyroid disease -an unusual or allergic reaction to lactose, prednisone, other medicines, foods, dyes, or preservatives -pregnant or trying to get pregnant -breast-feeding How should I use this medicine? Take this medicine by mouth with a glass of water. Follow the directions on the prescription label. Take this medicine with food. If you are taking this medicine once a day, take it in the morning. Do not take more medicine than you are told to take. Do not suddenly stop taking your medicine because you may develop a severe reaction. Your doctor will tell you how much medicine to take. If your doctor wants you to stop the medicine, the dose may be slowly lowered over time to avoid any side effects. Talk to your pediatrician regarding the use of this medicine in children. Special care may be needed. Overdosage: If you think you have taken too much of this medicine contact a poison control center or emergency room at once. NOTE: This medicine is only for you. Do not share this medicine with others. What if I miss a dose? If you miss a dose, take it as soon as you can. If it is almost time for your next dose, talk to your doctor or health care professional. You may need to miss a dose or take an extra dose. Do not take double or extra doses without advice. What may interact with this medicine? Do not take this medicine with any of the following medications: -metyrapone -mifepristone This medicine may also interact with the following medications: -aminoglutethimide -amphotericin B -aspirin and aspirin-like  medicines -barbiturates -certain medicines for diabetes, like glipizide or glyburide -cholestyramine -cholinesterase inhibitors -cyclosporine -digoxin -diuretics -ephedrine -female hormones, like estrogens and birth control pills -isoniazid -ketoconazole -NSAIDS, medicines for pain and inflammation, like ibuprofen or naproxen -phenytoin -rifampin -toxoids -vaccines -warfarin This list may not describe all possible interactions. Give your health care provider a list of all the medicines, herbs, non-prescription drugs, or dietary supplements you use. Also tell them if you smoke, drink alcohol, or use illegal drugs. Some items may interact with your medicine. What should I watch for while using this medicine? Visit your doctor or health care professional for regular checks on your progress. If you are taking this medicine over a prolonged period, carry an identification card with your name and address, the type and dose of your medicine, and your doctor's name and address. This medicine may increase your risk of getting an infection. Tell your doctor or health care professional if you are around anyone with measles or chickenpox, or if you develop sores or blisters that do not heal properly. If you are going to have surgery, tell your doctor or health care professional that you have taken this medicine within the last twelve months. Ask your doctor or health care professional about your diet. You may need to lower the amount of salt you eat. This medicine may affect blood sugar levels.  If you have diabetes, check with your doctor or health care professional before you change your diet or the dose of your diabetic medicine. What side effects may I notice from receiving this medicine? Side effects that you should report to your doctor or health care professional as soon as possible: -allergic reactions like skin rash, itching or hives, swelling of the face, lips, or tongue -changes in emotions  or moods -changes in vision -depressed mood -eye pain -fever or chills, cough, sore throat, pain or difficulty passing urine -increased thirst -swelling of ankles, feet Side effects that usually do not require medical attention (report to your doctor or health care professional if they continue or are bothersome): -confusion, excitement, restlessness -headache -nausea, vomiting -skin problems, acne, thin and shiny skin -trouble sleeping -weight gain This list may not describe all possible side effects. Call your doctor for medical advice about side effects. You may report side effects to FDA at 1-800-FDA-1088. Where should I keep my medicine? Keep out of the reach of children. Store at room temperature between 15 and 30 degrees C (59 and 86 degrees F). Protect from light. Keep container tightly closed. Throw away any unused medicine after the expiration date. NOTE: This sheet is a summary. It may not cover all possible information. If you have questions about this medicine, talk to your doctor, pharmacist, or health care provider.    2016, Elsevier/Gold Standard. (2010-10-28 10:57:14)

## 2016-05-05 ENCOUNTER — Other Ambulatory Visit: Payer: Self-pay | Admitting: Gastroenterology

## 2016-05-05 DIAGNOSIS — R1011 Right upper quadrant pain: Secondary | ICD-10-CM

## 2016-05-16 ENCOUNTER — Ambulatory Visit
Admission: RE | Admit: 2016-05-16 | Discharge: 2016-05-16 | Disposition: A | Discharge status: Home / Self Care | Payer: BLUE CROSS/BLUE SHIELD | Source: Ambulatory Visit | Attending: Gastroenterology | Admitting: Gastroenterology

## 2016-05-16 ENCOUNTER — Other Ambulatory Visit: Payer: BLUE CROSS/BLUE SHIELD

## 2016-05-16 DIAGNOSIS — R1011 Right upper quadrant pain: Secondary | ICD-10-CM

## 2016-07-30 ENCOUNTER — Inpatient Hospital Stay (HOSPITAL_COMMUNITY)
Admission: AD | Admit: 2016-07-30 | Discharge: 2016-07-30 | Disposition: A | Discharge status: Home / Self Care | Payer: BLUE CROSS/BLUE SHIELD | Source: Ambulatory Visit | Attending: Obstetrics and Gynecology | Admitting: Obstetrics and Gynecology

## 2016-07-30 ENCOUNTER — Encounter (HOSPITAL_COMMUNITY): Payer: Self-pay

## 2016-07-30 DIAGNOSIS — Z9889 Other specified postprocedural states: Secondary | ICD-10-CM | POA: Diagnosis not present

## 2016-07-30 DIAGNOSIS — Z87891 Personal history of nicotine dependence: Secondary | ICD-10-CM | POA: Insufficient documentation

## 2016-07-30 DIAGNOSIS — I951 Orthostatic hypotension: Secondary | ICD-10-CM | POA: Diagnosis not present

## 2016-07-30 DIAGNOSIS — N8110 Cystocele, unspecified: Secondary | ICD-10-CM | POA: Diagnosis present

## 2016-07-30 DIAGNOSIS — R351 Nocturia: Secondary | ICD-10-CM | POA: Diagnosis not present

## 2016-07-30 DIAGNOSIS — Z881 Allergy status to other antibiotic agents status: Secondary | ICD-10-CM | POA: Diagnosis not present

## 2016-07-30 DIAGNOSIS — Z915 Personal history of self-harm: Secondary | ICD-10-CM | POA: Diagnosis not present

## 2016-07-30 DIAGNOSIS — Z91040 Latex allergy status: Secondary | ICD-10-CM | POA: Diagnosis not present

## 2016-07-30 DIAGNOSIS — Z3202 Encounter for pregnancy test, result negative: Secondary | ICD-10-CM | POA: Diagnosis not present

## 2016-07-30 DIAGNOSIS — R350 Frequency of micturition: Secondary | ICD-10-CM | POA: Insufficient documentation

## 2016-07-30 DIAGNOSIS — Z803 Family history of malignant neoplasm of breast: Secondary | ICD-10-CM | POA: Diagnosis not present

## 2016-07-30 DIAGNOSIS — N83209 Unspecified ovarian cyst, unspecified side: Secondary | ICD-10-CM | POA: Diagnosis not present

## 2016-07-30 DIAGNOSIS — R51 Headache: Secondary | ICD-10-CM | POA: Diagnosis not present

## 2016-07-30 LAB — WET PREP, GENITAL
CLUE CELLS WET PREP: NONE SEEN
Sperm: NONE SEEN
Trich, Wet Prep: NONE SEEN
YEAST WET PREP: NONE SEEN

## 2016-07-30 LAB — URINALYSIS, ROUTINE W REFLEX MICROSCOPIC
Bilirubin Urine: NEGATIVE
Glucose, UA: NEGATIVE mg/dL
Hgb urine dipstick: NEGATIVE
Ketones, ur: NEGATIVE mg/dL
NITRITE: NEGATIVE
PH: 6 (ref 5.0–8.0)
Protein, ur: NEGATIVE mg/dL
SPECIFIC GRAVITY, URINE: 1.01 (ref 1.005–1.030)

## 2016-07-30 LAB — POCT PREGNANCY, URINE: PREG TEST UR: NEGATIVE

## 2016-07-30 NOTE — Discharge Instructions (Signed)
Pelvic Organ Prolapse Pelvic organ prolapse is the stretching, bulging, or dropping of pelvic organs into an abnormal position. It happens when the muscles and tissues that surround and support pelvic structures are stretched or weak. Pelvic organ prolapse can involve:  Vagina (vaginal prolapse).  Uterus (uterine prolapse).  Bladder (cystocele).  Rectum (rectocele).  Intestines (enterocele). When organs other than the vagina are involved, they often bulge into the vagina or protrude from the vagina, depending on how severe the prolapse is. What are the causes? Causes of this condition include:  Pregnancy, labor, and childbirth.  Long-lasting (chronic) cough.  Chronic constipation.  Obesity.  Past pelvic surgery.  Aging. During and after menopause, a decreased production of the hormone estrogen can weaken pelvic ligaments and muscles.  Consistently lifting more than 50 lb (23 kg).  Buildup of fluid in the abdomen due to certain diseases and other conditions. What are the signs or symptoms? Symptoms of this condition include:  Loss of bladder control when you cough, sneeze, strain, and exercise (stress incontinence). This may be worse immediately following childbirth, and it may gradually improve over time.  Feeling pressure in your pelvis or vagina. This pressure may increase when you cough or when you are having a bowel movement.  A bulge that protrudes from the opening of your vagina or against your vaginal wall. If your uterus protrudes through the opening of your vagina and rubs against your clothing, you may also experience soreness, ulcers, infection, pain, and bleeding.  Increased effort to have a bowel movement or urinate.  Pain in your low back.  Pain, discomfort, or disinterest in sexual intercourse.  Repeated bladder infections (urinary tract infections).  Difficulty inserting or inability to insert a tampon or applicator. In some people, this condition does  not cause any symptoms. How is this diagnosed? Your health care provider may perform an internal and external vaginal and rectal exam. During the exam, you may be asked to cough and strain while you are lying down, sitting, and standing up. Your health care provider will determine if other tests are required, such as bladder function tests. How is this treated? In most cases, this condition needs to be treated only if it produces symptoms. No treatment is guaranteed to correct the prolapse or relieve the symptoms completely. Treatment may include:  Lifestyle changes, such as:  Avoiding drinking beverages that contain caffeine.  Increasing your intake of high-fiber foods. This can help to decrease constipation and straining during bowel movements.  Emptying your bladder at scheduled times (bladder training therapy). This can help to reduce or avoid urinary incontinence.  Losing weight if you are overweight or obese.  Estrogen. Estrogen may help mild prolapse by increasing the strength and tone of pelvic floor muscles.  Kegel exercises. These may help mild cases of prolapse by strengthening and tightening the muscles of the pelvic floor.  Pessary insertion. A pessary is a soft, flexible device that is placed into your vagina by your health care provider to help support the vaginal walls and keep pelvic organs in place.  Surgery. This is often the only form of treatment for severe prolapse. Different types of surgeries are available. Follow these instructions at home:  Wear a sanitary pad or absorbent product if you have urinary incontinence.  Avoid heavy lifting and straining with exercise and work. Do not hold your breath when you perform mild to moderate lifting and exercise activities. Limit your activities as directed by your health care provider.  Take medicines   only as directed by your health care provider.  Perform Kegel exercises as directed by your health care provider.  If  you have a pessary, take care of it as directed by your health care provider. Contact a health care provider if:  Your symptoms interfere with your daily activities or sex life.  You need medicine to help with the discomfort.  You notice bleeding from the vagina that is not related to your period.  You have a fever.  You have pain or bleeding when you urinate.  You have bleeding when you have a bowel movement.  You lose urine when you have sex.  You have chronic constipation.  You have a pessary that falls out.  You have vaginal discharge that has a bad smell.  You have low abdominal pain or cramping that is unusual for you. This information is not intended to replace advice given to you by your health care provider. Make sure you discuss any questions you have with your health care provider. Document Released: 10/09/2013 Document Revised: 08/20/2015 Document Reviewed: 05/27/2013 Elsevier Interactive Patient Education  2017 Elsevier Inc.  

## 2016-07-30 NOTE — MAU Note (Signed)
Patient has had 2 prior vaginal deliveries, was swabbed a couple of months ago and told had Chlamydia and was treated, History of interstitial cystitis, noticed a cyst near urethra and another one on inner labia, feels burning and pressure.

## 2016-07-30 NOTE — MAU Provider Note (Signed)
History     CSN: [id number]  Arrival date and time: 07/30/16 1431   First Provider Initiated Contact with Patient 07/30/16 1504      Chief Complaint  Patient presents with  . Vaginal Prolapse   HPI Kathy [name] is a 26 y.o. [id number] who presents to MAU today with complaint of a cyst on her vaginal area. She states that she noted an area just under her urethra and another lower in her vaginal area. She noted this 1 week ago. She denies any changes in size. She feels more burning and discomfort since onset. She has not taken anything for pain. She denies bleeding, discharge, UTI symptoms today. She does have a history of interstitial cystitis.   OB History    Gravida Para Term Preterm AB Living   4 3 3  0 1 3   SAB TAB Ectopic Multiple Live Births   1 0 0 0 3      Past Medical History:  Diagnosis Date  . Asthma   . Bipolar 1 disorder (HCC)   . Cystitis, interstitial   . Depression    better now, situational, had pp with last child  . Dysthymic disorder   . Frequency of urination   . Headache(784.0)   . History of abnormal cervical Pap smear   . History of ovarian cyst   . History of pelvic inflammatory disease   . History of suicide attempt    06/ 2012  overdose oxycontin  . HSV-1 (herpes simplex virus 1) infection   . Nocturia   . Orthostatic hypotension   . Pelvic pain in female   . Vaginal Pap smear, abnormal    ok since    Past Surgical History:  Procedure Laterality Date  . CYSTO WITH HYDRODISTENSION N/A 02/25/2014   Procedure: CYSTOSCOPY/HYDRODISTENSION WITH INSTILLATION;  Surgeon: Martina SinnerScott A MacDiarmid, MD;  [facility];  Service: Urology;  Laterality: N/A;  . DILATION AND CURETTAGE OF UTERUS  2011   W/ SUCTION  . TONSILLECTOMY  09-10-2007    Family History  Problem Relation Age of Onset  . Adopted: Yes  . Breast cancer Maternal Grandmother   . Anesthesia problems Neg Hx     Social History  Substance Use Topics  .  Smoking status: Former Smoker    Packs/day: 0.25    Years: 0.30    Types: Cigarettes    Quit date: 03/28/2014  . Smokeless tobacco: Never Used  . Alcohol use Yes     Comment: 3x/month    Allergies:  Allergies  Allergen Reactions  . Diflucan [Fluconazole] Itching  . Latex Itching    redness     Prescriptions Prior to Admission  Medication Sig Dispense Refill Last Dose  . albuterol (PROVENTIL HFA;VENTOLIN HFA) 108 (90 BASE) MCG/ACT inhaler Inhale 2 puffs into the lungs every 6 (six) hours as needed for wheezing or shortness of breath (emergency asthma attacks).    rescue at Unknown time  . B Complex Vitamins (B-COMPLEX/B-12) TABS Take 1 tablet by mouth daily.   04/15/2015 at Unknown time  . calcium carbonate (TUMS - DOSED IN MG ELEMENTAL CALCIUM) 500 MG chewable tablet Chew 1 tablet by mouth 2 (two) times daily as needed for indigestion or heartburn.   Past Week at Unknown time  . ibuprofen (ADVIL,MOTRIN) 600 MG tablet Take 1 tablet (600 mg total) by mouth every 6 (six) hours. 30 tablet 0   . IRON PO Take 1 tablet by mouth daily.  04/15/2015 at Unknown time  . oxyCODONE (OXY IR/ROXICODONE) 5 MG immediate release tablet Take 1 tablet (5 mg total) by mouth every 4 (four) hours as needed (pain scale 4-7). 20 tablet 0   . predniSONE (DELTASONE) 50 MG tablet Take 1 tablet (50 mg total) by mouth daily. 5 tablet 0   . Prenatal Vit-Fe Fumarate-FA (PRENATAL MULTIVITAMIN) TABS tablet Take 1 tablet by mouth daily at 12 noon.    Past Month at Unknown time    Review of Systems  Constitutional: Negative for fever.  Gastrointestinal: Negative for abdominal pain, constipation, diarrhea, nausea and vomiting.  Genitourinary: Positive for vaginal pain. Negative for pelvic pain, vaginal bleeding and vaginal discharge.   Physical Exam   Blood pressure 105/62, pulse 85, temperature 98 F (36.7 C), resp. rate 18, height 5' 4.5" (1.638 m), weight 188 lb 0.6 oz (85.3 kg), last menstrual period 07/18/2016,  unknown if currently breastfeeding.  Physical Exam  Nursing note and vitals reviewed. Constitutional: She is oriented to person, place, and time. She appears well-developed and well-nourished. No distress.  HENT:  Head: Normocephalic and atraumatic.  Cardiovascular: Normal rate.   Respiratory: Effort normal.  GI: Soft. She exhibits no distension.  Genitourinary: There is no rash, tenderness or lesion on the right labia. There is no rash, tenderness or lesion on the left labia.  Genitourinary Comments: Grade I cystocele noted  Neurological: She is alert and oriented to person, place, and time.  Skin: Skin is warm and dry. No erythema.  Psychiatric: She has a normal mood and affect.    Results for orders placed or performed during the hospital encounter of 07/30/16 (from the past 24 hour(s))  Urinalysis, Routine w reflex microscopic     Status: Abnormal   Collection Time: 07/30/16  2:50 PM  Result Value Ref Range   Color, Urine YELLOW YELLOW   APPearance CLEAR CLEAR   Specific Gravity, Urine 1.010 1.005 - 1.030   pH 6.0 5.0 - 8.0   Glucose, UA NEGATIVE NEGATIVE mg/dL   Hgb urine dipstick NEGATIVE NEGATIVE   Bilirubin Urine NEGATIVE NEGATIVE   Ketones, ur NEGATIVE NEGATIVE mg/dL   Protein, ur NEGATIVE NEGATIVE mg/dL   Nitrite NEGATIVE NEGATIVE   Leukocytes, UA LARGE (A) NEGATIVE  Pregnancy, urine POC     Status: None   Collection Time: 07/30/16  3:04 PM  Result Value Ref Range   Preg Test, Ur NEGATIVE NEGATIVE  Wet prep, genital     Status: Abnormal   Collection Time: 07/30/16  3:13 PM  Result Value Ref Range   Yeast Wet Prep HPF POC NONE SEEN NONE SEEN   Trich, Wet Prep NONE SEEN NONE SEEN   Clue Cells Wet Prep HPF POC NONE SEEN NONE SEEN   WBC, Wet Prep HPF POC MODERATE (A) NONE SEEN   Sperm NONE SEEN     MAU Course  Procedures None  MDM UPT - negative UA, wet prep today  Discussed with Dr. Renaldo Fiddler. Agrees with plan for management.  Urine culture  ordered  Assessment and Plan  A: Grade I cystocele   P: Discharge home Warning signs for worsening condition discussed Discussed Kegel and Pelvic floor exercises and possibility of pelvic floor PT if needed Patient advised to follow-up with [facility] as needed for routine GYN care or if symptoms were to worsen Patient may return to MAU as needed or if her condition were to change or worsen  Vonzella Nipple, PA-C 07/30/2016, 4:02 PM

## 2016-07-31 LAB — URINE CULTURE: CULTURE: NO GROWTH

## 2016-11-02 ENCOUNTER — Encounter (HOSPITAL_COMMUNITY): Payer: Self-pay | Admitting: Emergency Medicine

## 2016-11-02 ENCOUNTER — Emergency Department (HOSPITAL_COMMUNITY)
Admission: EM | Admit: 2016-11-02 | Discharge: 2016-11-03 | Disposition: A | Discharge status: Home / Self Care | Payer: BLUE CROSS/BLUE SHIELD | Attending: Emergency Medicine | Admitting: Emergency Medicine

## 2016-11-02 DIAGNOSIS — R319 Hematuria, unspecified: Secondary | ICD-10-CM | POA: Diagnosis present

## 2016-11-02 DIAGNOSIS — Z79899 Other long term (current) drug therapy: Secondary | ICD-10-CM | POA: Diagnosis not present

## 2016-11-02 DIAGNOSIS — N390 Urinary tract infection, site not specified: Secondary | ICD-10-CM | POA: Insufficient documentation

## 2016-11-02 DIAGNOSIS — Z91040 Latex allergy status: Secondary | ICD-10-CM | POA: Diagnosis not present

## 2016-11-02 DIAGNOSIS — J45909 Unspecified asthma, uncomplicated: Secondary | ICD-10-CM | POA: Insufficient documentation

## 2016-11-02 DIAGNOSIS — Z87891 Personal history of nicotine dependence: Secondary | ICD-10-CM | POA: Insufficient documentation

## 2016-11-02 LAB — URINALYSIS, ROUTINE W REFLEX MICROSCOPIC
BILIRUBIN URINE: NEGATIVE
Glucose, UA: NEGATIVE mg/dL
Ketones, ur: NEGATIVE mg/dL
NITRITE: NEGATIVE
PROTEIN: 30 mg/dL — AB
Specific Gravity, Urine: 1.015 (ref 1.005–1.030)
pH: 8 (ref 5.0–8.0)

## 2016-11-02 NOTE — ED Provider Notes (Signed)
[facility]-EMERGENCY DEPT Provider Note   CSN: [id number] Arrival date & time: 11/02/16  1816     History   Chief Complaint Chief Complaint  Patient presents with  . Hematuria    HPI Nickcole V L Hearn is a 26 y.o. female.  HPI  26 y.o. female, presents to the Emergency Department today due to passing clots in urine. Pt saw PA at PCP office today and diagnosed with acute cystitis via UA. Given Rx Bactrim. Pt was told to come to ED due to passing clots and with flank pain. States pain occurred today. Rates pain 8/10 and constant. Mild suprapubic pressure. Notes hematuria is gradually improving. Denies N/V/D. No fever. No chills. No CP/SOB. Pt with history of interstitial cystitis and sees Urology for this. No other symptoms noted.  Past Medical History:  Diagnosis Date  . Asthma   . Bipolar 1 disorder (HCC)   . Cystitis, interstitial   . Depression    better now, situational, had pp with last child  . Dysthymic disorder   . Frequency of urination   . Headache(784.0)   . History of abnormal cervical Pap smear   . History of ovarian cyst   . History of pelvic inflammatory disease   . History of suicide attempt    06/ 2012  overdose oxycontin  . HSV-1 (herpes simplex virus 1) infection   . Nocturia   . Orthostatic hypotension   . Pelvic pain in female   . Vaginal Pap smear, abnormal    ok since    Patient Active Problem List   Diagnosis Date Noted  . Pregnant 04/16/2015  . Adjustment disorder with depressed mood 10/12/2014  . Anxiety 10/12/2014  . Viral gastroenteritis 10/18/2011    Past Surgical History:  Procedure Laterality Date  . CYSTO WITH HYDRODISTENSION N/A 02/25/2014   Procedure: CYSTOSCOPY/HYDRODISTENSION WITH INSTILLATION;  Surgeon: Martina Sinner, MD;  Location: [facility];  Service: Urology;  Laterality: N/A;  . DILATION AND CURETTAGE OF UTERUS  2011   W/ SUCTION  . TONSILLECTOMY  09-10-2007    OB History    Gravida Para Term  Preterm AB Living   4 3 3  0 1 3   SAB TAB Ectopic Multiple Live Births   1 0 0 0 3       Home Medications    Prior to Admission medications   Medication Sig Start Date End Date Taking? Authorizing Provider  albuterol (PROVENTIL HFA;VENTOLIN HFA) 108 (90 BASE) MCG/ACT inhaler Inhale 2 puffs into the lungs every 6 (six) hours as needed for wheezing or shortness of breath (emergency asthma attacks).     [provider]  B Complex Vitamins (B-COMPLEX/B-12) TABS Take 1 tablet by mouth daily.    [provider]  calcium carbonate (TUMS - DOSED IN MG ELEMENTAL CALCIUM) 500 MG chewable tablet Chew 1 tablet by mouth 2 (two) times daily as needed for indigestion or heartburn.    [provider]  IRON PO Take 1 tablet by mouth daily.    [provider]  Prenatal Vit-Fe Fumarate-FA (PRENATAL MULTIVITAMIN) TABS tablet Take 1 tablet by mouth daily at 12 noon.     [provider]    Family History Family History  Problem Relation Age of Onset  . Adopted: Yes  . Breast cancer Maternal Grandmother   . Anesthesia problems Neg Hx     Social History Social History  Substance Use Topics  . Smoking status: Former Smoker    Packs/day:  0.25    Years: 0.30    Types: Cigarettes    Quit date: 03/28/2014  . Smokeless tobacco: Never Used  . Alcohol use Yes     Comment: 3x/month     Allergies   Diflucan [fluconazole] and Latex   Review of Systems Review of Systems ROS reviewed and all are negative for acute change except as noted in the HPI.  Physical Exam Updated Vital Signs BP 113/66 (BP Location: Left Arm)   Pulse 80   Temp 98.8 F (37.1 C) (Oral)   Resp 18   LMP 10/24/2016   SpO2 99%   Physical Exam  Constitutional: She is oriented to person, place, and time. Vital signs are normal. She appears well-developed and well-nourished.  HENT:  Head: Normocephalic and atraumatic.  Right Ear: Hearing normal.  Left Ear: Hearing normal.  Eyes:  Pupils are equal, round, and reactive to light. Conjunctivae and EOM are normal.  Neck: Normal range of motion. Neck supple.  Cardiovascular: Normal rate, regular rhythm, normal heart sounds and intact distal pulses.   Pulmonary/Chest: Effort normal.  Abdominal: Normal appearance and bowel sounds are normal. There is CVA tenderness (right). There is no rigidity, no rebound, no guarding, no tenderness at McBurney's point and negative Murphy's sign.  Musculoskeletal: Normal range of motion.  Neurological: She is alert and oriented to person, place, and time.  Skin: Skin is warm and dry.  Psychiatric: She has a normal mood and affect. Her speech is normal and behavior is normal. Thought content normal.  Nursing note and vitals reviewed.  ED Treatments / Results  Labs (all labs ordered are listed, but only abnormal results are displayed) Labs Reviewed  URINALYSIS, ROUTINE W REFLEX MICROSCOPIC - Abnormal; Notable for the following:       Result Value   APPearance CLOUDY (*)    Hgb urine dipstick LARGE (*)    Protein, ur 30 (*)    Leukocytes, UA LARGE (*)    Bacteria, UA MANY (*)    Squamous Epithelial / LPF 0-5 (*)    All other components within normal limits  URINE CULTURE  POC URINE PREG, ED  I-STAT CHEM 8, ED    EKG  EKG Interpretation None       Radiology No results found.  Procedures Procedures (including critical care time)  Medications Ordered in ED Medications  sodium chloride 0.9 % bolus 1,000 mL (not administered)  cefTRIAXone (ROCEPHIN) 1 g in dextrose 5 % 50 mL IVPB (not administered)  ketorolac (TORADOL) 30 MG/ML injection 30 mg (not administered)     Initial Impression / Assessment and Plan / ED Course  I have reviewed the triage vital signs and the nursing notes.  Pertinent labs & imaging results that were available during my care of the patient were reviewed by me and considered in my medical decision making (see chart for details).  Final Clinical  Impressions(s) / ED Diagnoses  {I have reviewed and evaluated the relevant laboratory values. {I have reviewed and evaluated the relevant imaging studies.  {I have reviewed the relevant previous healthcare records.  {I obtained HPI from historian.   ED Course:  Assessment: Pt is a 26 y.o. female presents to the Emergency Department today due to passing clots in urine. Pt saw PA at PCP office today and diagnosed with acute cystitis via UA. Given Rx Bactrim. Pt was told to come to ED due to passing clots and with flank pain. States pain occurred today. Rates pain 8/10 and constant.  Mild suprapubic pressure. Notes hematuria is gradually improving. Denies N/V/D. No fever. No chills. No CP/SOB. Pt with history of interstitial cystitis and sees Urology for this. On exam, pt in NAD. Nontoxic/nonseptic appearing. VSS. Afebrile. Lungs CTA. Heart RRR. Abdomen nontender soft. Noted Right CVA tendereness. UA with evidence of UTI. Culture sent. Given IV Rocephin. Possible stone vs Pyelo. CT renal pending. Given fluids and analgesia in ED. If CT unremarkable. Will DC patient home with follow up to Urology. Pt with Rx Bactrim already from PCP, just has not taken medication. Anticipate DC home.   Disposition/Plan:  Anticipate DC Home Additional Verbal discharge instructions given and discussed with patient.  Pt Instructed to f/u with Urology in the next week for evaluation and treatment of symptoms. Return precautions given Pt acknowledges and agrees with plan  Supervising Physician Tilden Fossaees, Elizabeth, MD  Final diagnoses:  Urinary tract infection with hematuria, site unspecified    New Prescriptions New Prescriptions   No medications on file     Wilber BihariMohr, Aundra Espin, PA-C 11/03/16 Maudie [id number]    Rees, Elizabeth, MD 11/04/16 [phone]

## 2016-11-02 NOTE — ED Triage Notes (Addendum)
Patient reports passing blood clots in urine and dysuria that has gotten worse today.  Pt reports PMH IC and saw PA today since couldn't see her MD and PA was going to prescribe antibiotics which makes IC worse so patient came here since she couldn't get into urologist.

## 2016-11-03 ENCOUNTER — Emergency Department (HOSPITAL_COMMUNITY): Payer: BLUE CROSS/BLUE SHIELD

## 2016-11-03 DIAGNOSIS — R319 Hematuria, unspecified: Secondary | ICD-10-CM | POA: Diagnosis not present

## 2016-11-03 LAB — I-STAT CHEM 8, ED
BUN: 19 mg/dL (ref 6–20)
CALCIUM ION: 1.11 mmol/L — AB (ref 1.15–1.40)
CREATININE: 0.9 mg/dL (ref 0.44–1.00)
Chloride: 103 mmol/L (ref 101–111)
Glucose, Bld: 94 mg/dL (ref 65–99)
HCT: 33 % — ABNORMAL LOW (ref 36.0–46.0)
Hemoglobin: 11.2 g/dL — ABNORMAL LOW (ref 12.0–15.0)
Potassium: 3.8 mmol/L (ref 3.5–5.1)
Sodium: 139 mmol/L (ref 135–145)
TCO2: 27 mmol/L (ref 0–100)

## 2016-11-03 MED ORDER — KETOROLAC TROMETHAMINE 30 MG/ML IJ SOLN
30.0000 mg | Freq: Once | INTRAMUSCULAR | Status: AC
  Administered 2016-11-03: 30 mg via INTRAVENOUS
  Filled 2016-11-03: qty 1

## 2016-11-03 MED ORDER — DEXTROSE 5 % IV SOLN
1.0000 g | Freq: Once | INTRAVENOUS | Status: AC
  Administered 2016-11-03: 1 g via INTRAVENOUS
  Filled 2016-11-03: qty 10

## 2016-11-03 MED ORDER — SODIUM CHLORIDE 0.9 % IV BOLUS (SEPSIS)
1000.0000 mL | Freq: Once | INTRAVENOUS | Status: AC
  Administered 2016-11-03: 1000 mL via INTRAVENOUS

## 2016-11-03 NOTE — Discharge Instructions (Signed)
Please read and follow all provided instructions.  Your diagnoses today include:  1. Urinary tract infection with hematuria, site unspecified    Tests performed today include: Vital signs. See below for your results today.   Medications prescribed:  Take as prescribed from Primary Care Doctor  Home care instructions:  Follow any educational materials contained in this packet.  Follow-up instructions: Please follow-up with Urology for further evaluation of symptoms and treatment   Return instructions:  Please return to the Emergency Department if you do not get better, if you get worse, or new symptoms OR  - Fever (temperature greater than 101.38F)  - Bleeding that does not stop with holding pressure to the area    -Severe pain (please note that you may be more sore the day after your accident)  - Chest Pain  - Difficulty breathing  - Severe nausea or vomiting  - Inability to tolerate food and liquids  - Passing out  - Skin becoming red around your wounds  - Change in mental status (confusion or lethargy)  - New numbness or weakness    Please return if you have any other emergent concerns.  Additional Information:  Your vital signs today were: BP 113/66 (BP Location: Left Arm)    Pulse 80    Temp 98.8 F (37.1 C) (Oral)    Resp 18    LMP 10/24/2016    SpO2 99%  If your blood pressure (BP) was elevated above 135/85 this visit, please have this repeated by your doctor within one month. ---------------

## 2016-11-03 NOTE — ED Notes (Signed)
POC UPREG NEGATIVE 

## 2016-11-05 LAB — URINE CULTURE

## 2016-11-06 ENCOUNTER — Telehealth: Payer: Self-pay

## 2016-11-06 NOTE — Telephone Encounter (Signed)
Post ED Visit - Positive Culture Follow-up  Culture report reviewed by antimicrobial stewardship pharmacist:  []  Enzo BiNathan Batchelder, Pharm.D. []  Celedonio MiyamotoJeremy Frens, Pharm.D., BCPS AQ-ID []  Garvin FilaMike Maccia, Pharm.D., BCPS []  Georgina PillionElizabeth Martin, Pharm.D., BCPS []  Shel[place] Island HeightsMinh Pham, VermontPharm.D., BCPS, AAHIVP []  Estella HuskMichelle Turner, Pharm.D., BCPS, AAHIVP []  Lysle Pearlachel Rumbarger, PharmD, BCPS []  Casilda Carlsaylor Stone, PharmD, BCPS []  Pollyann SamplesAndy Johnston, PharmD, BCPS Babs BertinHaley Baird Pharm D Positive urine culture Treated with Bactrim, organism sensitive to the same and no further patient follow-up is required at this time.  Jerry CarasCullom, Dorothey Oetken Burnett 11/06/2016, 11:31 AM

## 2017-01-18 ENCOUNTER — Other Ambulatory Visit: Payer: Self-pay | Admitting: Family Medicine

## 2017-01-18 DIAGNOSIS — N63 Unspecified lump in unspecified breast: Secondary | ICD-10-CM

## 2017-01-24 ENCOUNTER — Other Ambulatory Visit: Payer: BLUE CROSS/BLUE SHIELD

## 2017-01-26 ENCOUNTER — Ambulatory Visit
Admission: RE | Admit: 2017-01-26 | Discharge: 2017-01-26 | Disposition: A | Discharge status: Home / Self Care | Payer: Medicaid Other | Source: Ambulatory Visit | Attending: Family Medicine | Admitting: Family Medicine

## 2017-01-26 DIAGNOSIS — N63 Unspecified lump in unspecified breast: Secondary | ICD-10-CM

## 2017-03-06 DIAGNOSIS — N3010 Interstitial cystitis (chronic) without hematuria: Secondary | ICD-10-CM | POA: Diagnosis present

## 2017-03-11 ENCOUNTER — Inpatient Hospital Stay (HOSPITAL_COMMUNITY)
Admission: EM | Admit: 2017-03-11 | Discharge: 2017-03-14 | DRG: 101 | Disposition: A | Discharge status: Home / Self Care | Payer: Medicaid Other | Attending: Internal Medicine | Admitting: Internal Medicine

## 2017-03-11 ENCOUNTER — Emergency Department (HOSPITAL_COMMUNITY): Payer: Medicaid Other

## 2017-03-11 DIAGNOSIS — N3010 Interstitial cystitis (chronic) without hematuria: Secondary | ICD-10-CM | POA: Diagnosis present

## 2017-03-11 DIAGNOSIS — R569 Unspecified convulsions: Secondary | ICD-10-CM

## 2017-03-11 DIAGNOSIS — R519 Headache, unspecified: Secondary | ICD-10-CM

## 2017-03-11 DIAGNOSIS — M542 Cervicalgia: Secondary | ICD-10-CM | POA: Diagnosis present

## 2017-03-11 DIAGNOSIS — Z87891 Personal history of nicotine dependence: Secondary | ICD-10-CM

## 2017-03-11 DIAGNOSIS — F4323 Adjustment disorder with mixed anxiety and depressed mood: Secondary | ICD-10-CM | POA: Diagnosis present

## 2017-03-11 DIAGNOSIS — R200 Anesthesia of skin: Secondary | ICD-10-CM | POA: Diagnosis present

## 2017-03-11 DIAGNOSIS — M62838 Other muscle spasm: Secondary | ICD-10-CM | POA: Diagnosis present

## 2017-03-11 DIAGNOSIS — N390 Urinary tract infection, site not specified: Secondary | ICD-10-CM | POA: Diagnosis present

## 2017-03-11 DIAGNOSIS — R531 Weakness: Secondary | ICD-10-CM | POA: Diagnosis present

## 2017-03-11 DIAGNOSIS — F4321 Adjustment disorder with depressed mood: Secondary | ICD-10-CM | POA: Diagnosis present

## 2017-03-11 DIAGNOSIS — R202 Paresthesia of skin: Secondary | ICD-10-CM

## 2017-03-11 DIAGNOSIS — G40409 Other generalized epilepsy and epileptic syndromes, not intractable, without status epilepticus: Principal | ICD-10-CM | POA: Diagnosis present

## 2017-03-11 DIAGNOSIS — R51 Headache: Secondary | ICD-10-CM

## 2017-03-11 DIAGNOSIS — F419 Anxiety disorder, unspecified: Secondary | ICD-10-CM | POA: Diagnosis present

## 2017-03-11 LAB — BASIC METABOLIC PANEL
Anion gap: 8 (ref 5–15)
BUN: 15 mg/dL (ref 6–20)
CALCIUM: 9.7 mg/dL (ref 8.9–10.3)
CO2: 27 mmol/L (ref 22–32)
CREATININE: 1.02 mg/dL — AB (ref 0.44–1.00)
Chloride: 107 mmol/L (ref 101–111)
GFR calc non Af Amer: >60 mL/min (ref >60)
Glucose, Bld: 97 mg/dL (ref 65–99)
Potassium: 4.4 mmol/L (ref 3.5–5.1)
Sodium: 142 mmol/L (ref 135–145)

## 2017-03-11 LAB — CBC WITH DIFFERENTIAL/PLATELET
BASOS PCT: 0 %
Basophils Absolute: 0 10*3/uL (ref 0.0–0.1)
Eosinophils Absolute: 0 10*3/uL (ref 0.0–0.7)
Eosinophils Relative: 0 %
HEMATOCRIT: 39 % (ref 36.0–46.0)
Hemoglobin: 12.8 g/dL (ref 12.0–15.0)
Lymphocytes Relative: 22 %
Lymphs Abs: 2.3 10*3/uL (ref 0.7–4.0)
MCH: 27.7 pg (ref 26.0–34.0)
MCHC: 32.8 g/dL (ref 30.0–36.0)
MCV: 84.4 fL (ref 78.0–100.0)
MONO ABS: 0.6 10*3/uL (ref 0.1–1.0)
MONOS PCT: 6 %
NEUTROS ABS: 7.5 10*3/uL (ref 1.7–7.7)
Neutrophils Relative %: 72 %
Platelets: 284 10*3/uL (ref 150–400)
RBC: 4.62 MIL/uL (ref 3.87–5.11)
RDW: 12.5 % (ref 11.5–15.5)
WBC: 10.5 10*3/uL (ref 4.0–10.5)

## 2017-03-11 LAB — I-STAT BETA HCG BLOOD, ED (MC, WL, AP ONLY)

## 2017-03-11 LAB — POC URINE PREG, ED: PREG TEST UR: NEGATIVE

## 2017-03-11 MED ORDER — LORAZEPAM 1 MG PO TABS
1.0000 mg | ORAL_TABLET | Freq: Once | ORAL | Status: DC
Start: 2017-03-11 — End: 2017-03-14

## 2017-03-11 MED ORDER — IOPAMIDOL (ISOVUE-370) INJECTION 76%
INTRAVENOUS | Status: AC
  Administered 2017-03-11: 100 mL via INTRAVENOUS
  Filled 2017-03-11: qty 100

## 2017-03-11 MED ORDER — METHOCARBAMOL 500 MG PO TABS
750.0000 mg | ORAL_TABLET | Freq: Once | ORAL | Status: AC
  Administered 2017-03-11: 750 mg via ORAL
  Filled 2017-03-11: qty 2

## 2017-03-11 MED ORDER — FENTANYL CITRATE (PF) 100 MCG/2ML IJ SOLN
50.0000 ug | Freq: Once | INTRAMUSCULAR | Status: DC
  Filled 2017-03-11: qty 2

## 2017-03-11 MED ORDER — LORAZEPAM 2 MG/ML IJ SOLN
INTRAMUSCULAR | Status: AC
  Administered 2017-03-11: 1 mg via INTRAVENOUS
  Filled 2017-03-11: qty 1

## 2017-03-11 NOTE — Consult Note (Signed)
[facility] TeleNeurology Consult Services  Patient was informed the Neurology Consult would happen via telehealth (remote video) and consented to receiving care in this manner.  Impression: LUE and LLE muscle spasms  - etiology is unknown. Exam is otherwise nonfocal. CTA head and neck ruled out dissection. Dystonia on the DDx. Functional causes also on the DDx. Consider MRI brain since it's the study of choice for new onset movement disorders.    Comments:  [facility] contacted: 22:25  [facility] at bedside: 22:55   Recommendations: Admit for further work-up Muscle relaxants as Baclofen 5 mg po tid MRI brain in am Inpatient Neurology consultation Discussed with ED MD   History of Present Illness  Patient is a 26 years old woman who presents to the ED c/o headache and neck pain. Patient reports she has been having neck pain for a while and went to see a chiropractor for the fist time yesterday. The chiropractor adjusted her neck. She reports that today at around 11am she had sudden onset headache, neck pain, left arm and led muscle spasms and walking difficulty. She reports she leans to the left when walking. She was shopping at a store at the time of symptom onset. Symptoms have remained constant since onset. HA is better now after she was given pain medication. PMHx significant for interstitial cystitis and anxiety.  Exam General: patient is awake, alert, and fully oriented Motor: sustained contractions of the left upper and left lower extremities noted  NIHSS score: 1 1A: Level of Consciousness - Alert; keenly responsive 1B: Ask Month and Age - Both Questions Right 1C: 'Blink Eyes' & 'Squeeze Hands' - Performs Both Tasks 2: Test Horizontal Extraocular Movements - Normal 3: Test Visual Fields - No Visual Loss 4: Test Facial Palsy - Normal symmetry 5A: Test Left Arm Motor Drift - No Drift for 10 Seconds 5B: Test Right Arm Motor Drift - No Drift for 10  Seconds 6A: Test Left Leg Motor Drift - No Drift for 5 Seconds 6B: Test Right Leg Motor Drift - No Drift for 5 Seconds 7: Test Limb Ataxia - No Ataxia 8: Test Sensation - Mild-Moderate Loss: Less Sharp/More Dull 9: Test Language/Aphasia - Normal; No aphasia 10: Test Dysarthria - Normal 11: Test Extinction/Inattention - No abnormality  Noncontrast CT head: negative for bleeding or acute ischemia  CTA head and neck: negative  Medical Decision Making:  - Extensive number of diagnosis or management options are considered above.  - Extensive amount of complex data reviewed.  - High risk of complication and/or morbidity or mortality are associated with differential diagnostic considerations above.  - There may be Uncertain outcome and increased probability of prolonged functional impairment or high probability of severe prolonged functional impairment associated with some of these differential diagnosis.  Medical Data Reviewed:  1.Data reviewed include clinical labs, radiology, Medical Tests;  2.Tests results discussed w/performing or interpreting physician;  3.Obtaining/reviewing old medical records;  4.Obtaining case history from another source;  5.Independent review of image, tracing or specimen.

## 2017-03-11 NOTE — ED Notes (Signed)
Patient transported to CT 

## 2017-03-11 NOTE — ED Notes (Signed)
Bed: WTR6 Expected date:  Expected time:  Means of arrival:  Comments: Triage 26 yo neck pain

## 2017-03-11 NOTE — ED Triage Notes (Signed)
Per EMS, from home with recent chiropractic adjustment. Experienced numbness and tingling with spasms bilaterally along with muscle contractions and spinal pain. VS [facility]

## 2017-03-11 NOTE — ED Provider Notes (Signed)
[facility]-EMERGENCY DEPT Provider Note   CSN: [id number] Arrival date & time: 03/11/17  1547     History   Chief Complaint Chief Complaint  Patient presents with  . Spasms    HPI Kathy [name] is a 26 y.o. female who presents the emergency department today for neck pain and new neurologic deficits on the left side.  Patient was seen by a chiropractor yesterday and had neck manipulation done for which she was told was cervical degenerative disease.  She states that shortly after the manipulation procedure of her cervical spine she started having dizziness and neck pain.  While she was at [facility] today at approximately 12 PM she noted she started walking towards the left side.  She has been having blurring of her vision on the left side, numbness/tingling of the left arm and leg, decreased strength of the left upper and lower extremity and spasmodic contractions of the left arm and hand.  She notes that she also has cervical neck pain and left-sided neck pain associated with this.  She denies any bowel or bladder incontinence.  No urinary retention.  She feels her symptoms are worsening.  He has not taken anything for this.  HPI  Past Medical History:  Diagnosis Date  . Asthma   . Bipolar 1 disorder (HCC)   . Cystitis, interstitial   . Depression    better now, situational, had pp with last child  . Dysthymic disorder   . Frequency of urination   . Headache(784.0)   . History of abnormal cervical Pap smear   . History of ovarian cyst   . History of pelvic inflammatory disease   . History of suicide attempt    06/ 2012  overdose oxycontin  . HSV-1 (herpes simplex virus 1) infection   . Nocturia   . Orthostatic hypotension   . Pelvic pain in female   . Vaginal Pap smear, abnormal    ok since    Patient Active Problem List   Diagnosis Date Noted  . Pregnant 04/16/2015  . Adjustment disorder with depressed mood 10/12/2014  . Anxiety 10/12/2014    . Viral gastroenteritis 10/18/2011    Past Surgical History:  Procedure Laterality Date  . CYSTO WITH HYDRODISTENSION N/A 02/25/2014   Procedure: CYSTOSCOPY/HYDRODISTENSION WITH INSTILLATION;  Surgeon: Martina Sinner, MD;  Location: [facility];  Service: Urology;  Laterality: N/A;  . DILATION AND CURETTAGE OF UTERUS  2011   W/ SUCTION  . TONSILLECTOMY  09-10-2007    OB History    Gravida Para Term Preterm AB Living   4 3 3  0 1 3   SAB TAB Ectopic Multiple Live Births   1 0 0 0 3       Home Medications    Prior to Admission medications   Medication Sig Start Date End Date Taking? Authorizing Provider  MONONESSA 0.25-35 MG-MCG tablet Take 1 tablet by mouth daily. 10/05/16   [provider]  Multiple Vitamin (MULTIVITAMIN WITH MINERALS) TABS tablet Take 1 tablet by mouth daily.    [provider]  OVER THE COUNTER MEDICATION Take 1 tablet by mouth daily.    [provider]  OVER THE COUNTER MEDICATION Take 2 tablets by mouth 2 (two) times daily.    [provider]    Family History Family History  Adopted: Yes  Problem Relation Age of Onset  . Breast cancer Maternal Grandmother   . Anesthesia problems Neg Hx  Social History Social History   Tobacco Use  . Smoking status: Former Smoker    Packs/day: 0.25    Years: 0.30    Pack years: 0.07    Types: Cigarettes    Last attempt to quit: 03/28/2014    Years since quitting: 2.9  . Smokeless tobacco: Never Used  Substance Use Topics  . Alcohol use: Yes    Comment: 3x/month  . Drug use: No     Allergies   Diflucan [fluconazole] and Latex   Review of Systems Review of Systems  All other systems reviewed and are negative.    Physical Exam Updated Vital Signs Pulse 98   Temp 98.2 F (36.8 C)   Resp (!) 22   SpO2 98%   Physical Exam  Constitutional: She appears well-developed and well-nourished.  HENT:  Head: Normocephalic and atraumatic.  Right  Ear: External ear normal.  Left Ear: External ear normal.  Nose: Nose normal.  Mouth/Throat: Uvula is midline, oropharynx is clear and moist and mucous membranes are normal. No tonsillar exudate.  Eyes: Pupils are equal, round, and reactive to light. Right eye exhibits no discharge. Left eye exhibits no discharge. No scleral icterus.  Neck: Trachea normal. Neck supple. Spinous process tenderness present. Carotid bruit is not present. No neck rigidity. Normal range of motion present.    No bruit or thrill.   Cardiovascular: Normal rate, regular rhythm and intact distal pulses.  No murmur heard. Pulses:      Radial pulses are 2+ on the right side, and 2+ on the left side.       Dorsalis pedis pulses are 2+ on the right side, and 2+ on the left side.       Posterior tibial pulses are 2+ on the right side, and 2+ on the left side.  No lower extremity swelling or edema. Calves symmetric in size bilaterally.  Pulmonary/Chest: Effort normal and breath sounds normal. She exhibits no tenderness.  Abdominal: Soft. Bowel sounds are normal. There is no tenderness. There is no rebound and no guarding.  Musculoskeletal: She exhibits no edema.  Left hand noted to be in contracture.    Lymphadenopathy:    She has no cervical adenopathy.  Neurological: She is alert.  Mental Status:  Alert, oriented, thought content appropriate, able to give a coherent history. Speech fluent without evidence of aphasia. Able to follow 2 step commands without difficulty.  Cranial Nerves:  II: Patient notes blurriness of left eye with decreased peripheral fields.  Pupils equal, round, reactive to light III,IV, VI: ptosis not present, extra-ocular motions intact bilaterally  V,VII: smile symmetric, eyebrows raise symmetric, facial light touch sensation equal VIII: hearing grossly normal to voice  X: uvula elevates symmetrically  XI: bilateral shoulder shrug symmetric and strong XII: midline tongue extension without  fassiculations Motor:  Normal tone.  Decreased strength of the left upper and lower extremities but question if patient unwilling to participate.  There is sustained contractions of the upper extremities.  Strength tested includes elbow flexion/extension, grip strength and dorsiflexion/plantar flexion Sensory: Subjective numbness of left side.   Deep Tendon Reflexes: Reflexes 2+ of the lower extremity and symmetric. Possible decreased reflexes of biceps when comparing left compared to right.  Cerebellar: Patient unable to perform finger-to-nose with left upper extremities due to jerking motions of upper extremity. Able finger to nose of the right upper extremity. Normal heel-to -shin balance bilaterally of the lower extremity.  Patient unable to perform pronator drift as she has jerking  rhythmic movements of the left arm.  Gait: able gait but does walk towards left.  CV: distal pulses palpable throughout   Skin: Skin is warm and dry. No rash noted. She is not diaphoretic.  Psychiatric: She has a normal mood and affect.  Nursing note and vitals reviewed.    ED Treatments / Results  Labs (all labs ordered are listed, but only abnormal results are displayed) Labs Reviewed  BASIC METABOLIC PANEL - Abnormal; Notable for the following components:      Result Value   Creatinine, Ser 1.02 (*)    All other components within normal limits  CBC WITH DIFFERENTIAL/PLATELET  POC URINE PREG, ED  I-STAT BETA HCG BLOOD, ED ([facility], [facility], [facility] ONLY)    EKG  EKG Interpretation None       Radiology Ct Angio Head W Or Wo Contrast  Result Date: 03/11/2017 CLINICAL DATA:  Recent spinal manipulation. Neck pain. Left-sided neurological deficits. Concern for arterial dissection. EXAM: CT ANGIOGRAPHY HEAD AND NECK TECHNIQUE: Multidetector CT imaging of the head and neck was performed using the standard protocol during bolus administration of intravenous contrast. Multiplanar CT image reconstructions and MIPs were  obtained to evaluate the vascular anatomy. Carotid stenosis measurements (when applicable) are obtained utilizing NASCET criteria, using the distal internal carotid diameter as the denominator. CONTRAST:  ISOVUE-370 IOPAMIDOL (ISOVUE-370) INJECTION 76% COMPARISON:  Head CT 01/24/2012 FINDINGS: CT HEAD FINDINGS Brain: There is no evidence of acute infarct, intracranial hemorrhage, mass, midline shift, or extra-axial fluid collection. The ventricles and sulci are normal. Vascular: No hyperdense vessel. Skull: No fracture or focal osseous lesion. Sinuses: Visualized paranasal sinuses and mastoid air cells are clear. Orbits: Unremarkable. Review of the MIP images confirms the above findings CTA NECK FINDINGS Aortic arch: Normal variant aortic arch branching pattern with common origin of the brachiocephalic and left common carotid arteries. Widely patent brachiocephalic and subclavian arteries. Right carotid system: Patent without evidence of stenosis, dissection, or significant atherosclerosis. Left carotid system: Patent without evidence of stenosis, dissection, or significant atherosclerosis. Vertebral arteries: Patent without evidence of stenosis, dissection, or significant atherosclerosis. Mildly to moderately dominant left vertebral artery. Skeleton: No acute osseous abnormality or suspicious osseous lesion. No evidence of significant cervical spondylosis. Other neck: No mass or lymph node enlargement. Upper chest: Clear lung apices. Review of the MIP images confirms the above findings CTA HEAD FINDINGS Anterior circulation: The internal carotid arteries are widely patent from skullbase to carotid termini. ACAs and MCAs are patent without evidence of proximal branch occlusion or significant stenosis. The right A1 segment is markedly hypoplastic. No aneurysm. Posterior circulation: The intracranial vertebral arteries are patent to the basilar. A patent right PICA is identified. A left PICA is not clearly  identified. AICAs and SCAs are small and not well evaluated. Basilar artery is widely patent. There is a small right posterior communicating artery. PCAs are patent without evidence of significant stenosis. No aneurysm. Venous sinuses: Patent. Anatomic variants: Hypoplastic right A1. Delayed phase: No abnormal enhancement. Review of the MIP images confirms the above findings IMPRESSION: Negative head and neck CTA.  No evidence of dissection. Electronically Signed   By: Sebastian Ache M.D.   On: 03/11/2017 20:15   Ct Angio Neck W And/or Wo Contrast  Result Date: 03/11/2017 CLINICAL DATA:  Recent spinal manipulation. Neck pain. Left-sided neurological deficits. Concern for arterial dissection. EXAM: CT ANGIOGRAPHY HEAD AND NECK TECHNIQUE: Multidetector CT imaging of the head and neck was performed using the standard protocol during bolus  administration of intravenous contrast. Multiplanar CT image reconstructions and MIPs were obtained to evaluate the vascular anatomy. Carotid stenosis measurements (when applicable) are obtained utilizing NASCET criteria, using the distal internal carotid diameter as the denominator. CONTRAST:  ISOVUE-370 IOPAMIDOL (ISOVUE-370) INJECTION 76% COMPARISON:  Head CT 01/24/2012 FINDINGS: CT HEAD FINDINGS Brain: There is no evidence of acute infarct, intracranial hemorrhage, mass, midline shift, or extra-axial fluid collection. The ventricles and sulci are normal. Vascular: No hyperdense vessel. Skull: No fracture or focal osseous lesion. Sinuses: Visualized paranasal sinuses and mastoid air cells are clear. Orbits: Unremarkable. Review of the MIP images confirms the above findings CTA NECK FINDINGS Aortic arch: Normal variant aortic arch branching pattern with common origin of the brachiocephalic and left common carotid arteries. Widely patent brachiocephalic and subclavian arteries. Right carotid system: Patent without evidence of stenosis, dissection, or significant  atherosclerosis. Left carotid system: Patent without evidence of stenosis, dissection, or significant atherosclerosis. Vertebral arteries: Patent without evidence of stenosis, dissection, or significant atherosclerosis. Mildly to moderately dominant left vertebral artery. Skeleton: No acute osseous abnormality or suspicious osseous lesion. No evidence of significant cervical spondylosis. Other neck: No mass or lymph node enlargement. Upper chest: Clear lung apices. Review of the MIP images confirms the above findings CTA HEAD FINDINGS Anterior circulation: The internal carotid arteries are widely patent from skullbase to carotid termini. ACAs and MCAs are patent without evidence of proximal branch occlusion or significant stenosis. The right A1 segment is markedly hypoplastic. No aneurysm. Posterior circulation: The intracranial vertebral arteries are patent to the basilar. A patent right PICA is identified. A left PICA is not clearly identified. AICAs and SCAs are small and not well evaluated. Basilar artery is widely patent. There is a small right posterior communicating artery. PCAs are patent without evidence of significant stenosis. No aneurysm. Venous sinuses: Patent. Anatomic variants: Hypoplastic right A1. Delayed phase: No abnormal enhancement. Review of the MIP images confirms the above findings IMPRESSION: Negative head and neck CTA.  No evidence of dissection. Electronically Signed   By: Sebastian Ache M.D.   On: 03/11/2017 20:15    Procedures Procedures (including critical care time)  Medications Ordered in ED Medications  fentaNYL (SUBLIMAZE) injection 50 mcg (50 mcg Intravenous Refused 03/11/17 1834)  LORazepam (ATIVAN) tablet 1 mg (1 mg Oral Not Given 03/11/17 1934)  iopamidol (ISOVUE-370) 76 % injection (100 mLs Intravenous Contrast Given 03/11/17 1937)  LORazepam (ATIVAN) 2 MG/ML injection (1 mg Intravenous Given 03/11/17 1931)  methocarbamol (ROBAXIN) tablet 750 mg (750 mg Oral Given  03/11/17 2156)     Initial Impression / Assessment and Plan / ED Course  I have reviewed the triage vital signs and the nursing notes.  Pertinent labs & imaging results that were available during my care of the patient were reviewed by me and considered in my medical decision making (see chart for details).     26 y.o. female presents the emergency department today for HA, neck pain, left arm spasms, and subjective numbness/tingling of left side. She felt she was leaning to the left when walking. Exam as above. Concern for vertebral dissection after spinal manipulation at chiropractor and new left-sided neurologic findings.  Will order CTA of head and neck.  CTA negative. HA improved in the department. Muscle relaxer's given for possible muscle contractions  Consult placed to Tele neuro consult.   No improvement of sustained contractions of the upper extremities. Tele-Neuro advised patient needs admission for further workup, muscle relaxants such as Baclofen 5mg  PO  TID, MRI of the brain in the AM, inpatient Neurology consultation.   Dr. Clyde LundborgNiu to admit the patient. Patient in agreement with plan and appears safe for admission.   Patient case seen and discussed with Dr. Rhunette CroftNanavati who is in agreement with plan.   Final Clinical Impressions(s) / ED Diagnoses   Final diagnoses:  Bad headache  Neck pain  Paresthesias    ED Discharge Orders    None       Jacinto HalimMaczis, Chabely Norby M, Cordelia Poche-C 03/12/17 Cathren Harsh0045    Nanavati, Ankit, MD 03/12/17 [phone]

## 2017-03-12 ENCOUNTER — Observation Stay (HOSPITAL_COMMUNITY): Payer: Medicaid Other

## 2017-03-12 ENCOUNTER — Encounter (HOSPITAL_COMMUNITY): Payer: Self-pay

## 2017-03-12 ENCOUNTER — Other Ambulatory Visit: Payer: Self-pay

## 2017-03-12 DIAGNOSIS — R519 Headache, unspecified: Secondary | ICD-10-CM

## 2017-03-12 DIAGNOSIS — F4321 Adjustment disorder with depressed mood: Secondary | ICD-10-CM | POA: Diagnosis not present

## 2017-03-12 DIAGNOSIS — R200 Anesthesia of skin: Secondary | ICD-10-CM | POA: Diagnosis present

## 2017-03-12 DIAGNOSIS — M542 Cervicalgia: Secondary | ICD-10-CM | POA: Diagnosis not present

## 2017-03-12 DIAGNOSIS — R51 Headache: Secondary | ICD-10-CM

## 2017-03-12 DIAGNOSIS — N390 Urinary tract infection, site not specified: Secondary | ICD-10-CM | POA: Diagnosis present

## 2017-03-12 DIAGNOSIS — F419 Anxiety disorder, unspecified: Secondary | ICD-10-CM | POA: Diagnosis not present

## 2017-03-12 LAB — RAPID URINE DRUG SCREEN, HOSP PERFORMED
AMPHETAMINES: NOT DETECTED
Barbiturates: NOT DETECTED
Benzodiazepines: NOT DETECTED
Cocaine: NOT DETECTED
Opiates: NOT DETECTED
TETRAHYDROCANNABINOL: NOT DETECTED

## 2017-03-12 LAB — BASIC METABOLIC PANEL
ANION GAP: 7 (ref 5–15)
BUN: 14 mg/dL (ref 6–20)
CHLORIDE: 103 mmol/L (ref 101–111)
CO2: 27 mmol/L (ref 22–32)
Calcium: 9.1 mg/dL (ref 8.9–10.3)
Creatinine, Ser: 0.83 mg/dL (ref 0.44–1.00)
GFR calc Af Amer: >60 mL/min (ref >60)
GLUCOSE: 93 mg/dL (ref 65–99)
POTASSIUM: 3.4 mmol/L — AB (ref 3.5–5.1)
Sodium: 137 mmol/L (ref 135–145)

## 2017-03-12 LAB — URINALYSIS, ROUTINE W REFLEX MICROSCOPIC
Bilirubin Urine: NEGATIVE
GLUCOSE, UA: NEGATIVE mg/dL
Ketones, ur: 20 mg/dL — AB
NITRITE: NEGATIVE
PROTEIN: NEGATIVE mg/dL
Specific Gravity, Urine: >1.046 — ABNORMAL HIGH (ref 1.005–1.030)
pH: 6 (ref 5.0–8.0)

## 2017-03-12 LAB — CBC
HEMATOCRIT: 36.9 % (ref 36.0–46.0)
HEMOGLOBIN: 12 g/dL (ref 12.0–15.0)
MCH: 27.9 pg (ref 26.0–34.0)
MCHC: 32.5 g/dL (ref 30.0–36.0)
MCV: 85.8 fL (ref 78.0–100.0)
Platelets: 256 10*3/uL (ref 150–400)
RBC: 4.3 MIL/uL (ref 3.87–5.11)
RDW: 12.7 % (ref 11.5–15.5)
WBC: 8.8 10*3/uL (ref 4.0–10.5)

## 2017-03-12 LAB — GLUCOSE, CAPILLARY: GLUCOSE-CAPILLARY: 82 mg/dL (ref 65–99)

## 2017-03-12 MED ORDER — POTASSIUM CHLORIDE CRYS ER 20 MEQ PO TBCR
40.0000 meq | EXTENDED_RELEASE_TABLET | Freq: Once | ORAL | Status: AC
  Administered 2017-03-12: 40 meq via ORAL

## 2017-03-12 MED ORDER — ADULT MULTIVITAMIN W/MINERALS CH
1.0000 | ORAL_TABLET | Freq: Every day | ORAL | Status: DC
  Administered 2017-03-12 – 2017-03-14 (×3): 1 via ORAL
  Filled 2017-03-12 (×3): qty 1

## 2017-03-12 MED ORDER — IBUPROFEN 200 MG PO TABS: 200.0000 mg | ORAL_TABLET | Freq: Four times a day (QID) | ORAL | Status: DC | PRN

## 2017-03-12 MED ORDER — METHOCARBAMOL 500 MG PO TABS: 500.0000 mg | ORAL_TABLET | Freq: Three times a day (TID) | ORAL | Status: DC | PRN

## 2017-03-12 MED ORDER — KETOROLAC TROMETHAMINE 30 MG/ML IJ SOLN
30.0000 mg | Freq: Once | INTRAMUSCULAR | Status: AC
  Administered 2017-03-12: 30 mg via INTRAVENOUS
  Filled 2017-03-12: qty 1

## 2017-03-12 MED ORDER — DIPHENHYDRAMINE HCL 12.5 MG/5ML PO ELIX
12.5000 mg | ORAL_SOLUTION | Freq: Two times a day (BID) | ORAL | Status: DC
  Administered 2017-03-12 – 2017-03-14 (×3): 12.5 mg via ORAL
  Filled 2017-03-12 (×4): qty 5

## 2017-03-12 MED ORDER — IBUPROFEN 200 MG PO TABS: 600.0000 mg | ORAL_TABLET | Freq: Four times a day (QID) | ORAL | Status: DC | PRN

## 2017-03-12 MED ORDER — PROCHLORPERAZINE EDISYLATE 5 MG/ML IJ SOLN
10.0000 mg | Freq: Four times a day (QID) | INTRAMUSCULAR | Status: DC
  Administered 2017-03-12 – 2017-03-14 (×5): 10 mg via INTRAVENOUS
  Filled 2017-03-12 (×6): qty 2

## 2017-03-12 MED ORDER — ACETAMINOPHEN 650 MG RE SUPP: 650.0000 mg | Freq: Four times a day (QID) | RECTAL | Status: DC | PRN

## 2017-03-12 MED ORDER — ONDANSETRON HCL 4 MG PO TABS: 4.0000 mg | ORAL_TABLET | Freq: Four times a day (QID) | ORAL | Status: DC | PRN

## 2017-03-12 MED ORDER — TRIMETHOPRIM 100 MG PO TABS
100.0000 mg | ORAL_TABLET | Freq: Every day | ORAL | Status: DC
  Filled 2017-03-12: qty 1

## 2017-03-12 MED ORDER — ALPRAZOLAM 0.5 MG PO TABS: 0.5000 mg | ORAL_TABLET | Freq: Three times a day (TID) | ORAL | Status: DC | PRN

## 2017-03-12 MED ORDER — ONDANSETRON HCL 4 MG/2ML IJ SOLN: 4.0000 mg | Freq: Four times a day (QID) | INTRAMUSCULAR | Status: DC | PRN

## 2017-03-12 MED ORDER — ENOXAPARIN SODIUM 40 MG/0.4ML ~~LOC~~ SOLN
40.0000 mg | SUBCUTANEOUS | Status: DC
  Administered 2017-03-13: 40 mg via SUBCUTANEOUS
  Filled 2017-03-12 (×2): qty 0.4

## 2017-03-12 MED ORDER — PANTOPRAZOLE SODIUM 40 MG PO TBEC
40.0000 mg | DELAYED_RELEASE_TABLET | Freq: Every day | ORAL | Status: DC
  Administered 2017-03-12 – 2017-03-14 (×3): 40 mg via ORAL
  Filled 2017-03-12 (×3): qty 1

## 2017-03-12 MED ORDER — ACETAMINOPHEN 325 MG PO TABS
650.0000 mg | ORAL_TABLET | Freq: Four times a day (QID) | ORAL | Status: DC | PRN
  Administered 2017-03-12: 650 mg via ORAL
  Filled 2017-03-12 (×2): qty 2

## 2017-03-12 MED ORDER — DEXTROSE 5 % IV SOLN
1.0000 g | Freq: Every day | INTRAVENOUS | Status: DC
  Administered 2017-03-12 – 2017-03-14 (×3): 1 g via INTRAVENOUS
  Filled 2017-03-12 (×3): qty 10

## 2017-03-12 MED ORDER — ACETAMINOPHEN 325 MG PO TABS
650.0000 mg | ORAL_TABLET | Freq: Four times a day (QID) | ORAL | Status: DC | PRN
  Administered 2017-03-12: 650 mg via ORAL
  Filled 2017-03-12: qty 2

## 2017-03-12 NOTE — Progress Notes (Signed)
I have seen and assessed patient and I agree with Dr.Niu's assessment and plan.  Patient is a 26 year old female history of asthma, depression, interstitial cystitis, bipolar disorder, anxiety, adjustment disorder presented with left-sided numbness weakness muscle spasms and neck pain, Headache.  Patient also describing episodes of jerking in the left upper extremity with some tingling.  Infectious workup negative.  MRI head MRI C-spine negative for any acute abnormalities.  Patient with some clinical improvement.  We will check an EEG to rule out seizure disorder.  Consult with neurology for further evaluation and management.  No charge.

## 2017-03-12 NOTE — Consult Note (Addendum)
Neurology Consultation Reason for Consult: Left-sided weakness Referring Physician: Janee Mornhompson, D  CC: Left-sided weakness  History is obtained from: Patient  HPI: Kathy [name] is a 26 y.o. female with a history of bipolar disorder who presents with left-sided weakness and headache.  She went to see a chiropractor last week and following this, she started having a headache.  She then began starting to have left sided numbness and weakness as well as spasms in the arm.  She states that the spasms have improved today, but they were intermittent clenching type movements.  She endorses that she has had a headache, which is occipital in location but associated with nausea and photophobia.   ROS: A 14 point ROS was performed and is negative except as noted in the HPI.   Past Medical History:  Diagnosis Date  . Asthma   . Bipolar 1 disorder (HCC)   . Cystitis, interstitial   . Depression    better now, situational, had pp with last child  . Dysthymic disorder   . Frequency of urination   . Headache(784.0)   . History of abnormal cervical Pap smear   . History of ovarian cyst   . History of pelvic inflammatory disease   . History of suicide attempt    06/ 2012  overdose oxycontin  . HSV-1 (herpes simplex virus 1) infection   . Nocturia   . Orthostatic hypotension   . Pelvic pain in female   . Vaginal Pap smear, abnormal    ok since     Family History  Adopted: Yes  Problem Relation Age of Onset  . Breast cancer Maternal Grandmother   . Anesthesia problems Neg Hx      Social History:  reports that she quit smoking about 2 years ago. Her smoking use included cigarettes. She has a 0.07 pack-year smoking history. she has never used smokeless tobacco. She reports that she drinks alcohol. She reports that she does not use drugs.   Exam: Current vital signs: BP 113/67 (BP Location: Right Arm)   Pulse 64   Temp 98.5 F (36.9 C) (Axillary)   Resp 18   Ht 5\' 5"  (1.651 m)    Wt 84.6 kg (186 lb 9.6 oz)   LMP 02/21/2017 (Exact Date)   SpO2 99%   BMI 31.05 kg/m  Vital signs in last 24 hours: Temp:  [97.7 F (36.5 C)-99.3 F (37.4 C)] 98.5 F (36.9 C) (12/16 1411) Pulse Rate:  [58-89] 64 (12/16 1411) Resp:  [18-20] 18 (12/16 1411) BP: (106-123)/(64-70) 113/67 (12/16 1411) SpO2:  [92 %-100 %] 99 % (12/16 1411) Weight:  [84.6 kg (186 lb 9.6 oz)] 84.6 kg (186 lb 9.6 oz) (12/16 0248)   Physical Exam  Constitutional: Appears well-developed and well-nourished.  Psych: Affect appropriate to situation Eyes: No scleral injection HENT: No OP obstrucion Head: Normocephalic.  Cardiovascular: Normal rate and regular rhythm.  Respiratory: Effort normal, non-labored breathing GI: Soft.  No distension. There is no tenderness.  Skin: WDI  Neuro: Mental Status: Patient is awake, alert, oriented to person, place, month, year, and situation. Patient is able to give a clear and coherent history. No signs of aphasia or neglect Cranial Nerves: II: Visual Fields are full. Pupils are equal, round, and reactive to light.   III,IV, VI: EOMI without ptosis or diploplia.  V: Facial sensation is decreased on the left, she does split midline to both vibration and pinprick. VII: Facial movement is symmetric.  VIII: hearing is intact to  voice X: Uvula elevates symmetrically XI: Shoulder shrug is symmetric. XII: tongue is midline without atrophy or fasciculations.  Motor: Tone is normal. Bulk is normal. 5/5 strength was present on the right, on the left she gets 4/5 weakness but does not have pronator drift. Sensory: Sensation is diminished throughout the left side Cerebellar: FNF intact bilaterally  I have reviewed labs in epic and the results pertinent to this consultation are: BMP-unremarkable  I have reviewed the images obtained: MRI brain and cervical spine-negative  Impression: 26 year old female with left-sided muscle spasms/numbness/weakness of unclear  etiology.  There are some findings on exam concerning for nonorganic etiology.  I do think that with a photophobic headache, complicated migraine is a possibility and I would favor treating it as such at this time.  With her left-sided spasms, I think a routine EEG would be reasonable.  Recommendations: 1) Toradol x1, Compazine/Benadryl scheduled overnight. 2) could consider PT/OT consult if she continues to be symptomatic. 3) EEG  Ritta SlotMcNeill Chania Kochanski, MD [facility] [phone]  If 7pm- 7am, please page neurology on call as listed in AMION.

## 2017-03-12 NOTE — H&P (Addendum)
History and Physical    Kathy [name] Reger WUJ:[id number] DOB: [birth year] DOA: 03/11/2017  Referring MD/NP/PA:   PCP: [facility]   Patient coming from:  The patient is coming from home.  At baseline, pt is independent for most of ADL.  Chief Complaint: neck pain, left sided numbness, weakness and muscle spasm  HPI: Kathy [name] is a 26 y.o. female with medical history significant of asthma, depression, interstitial cystitis, anxiety, bipolar disorder, adjustment disorder,who presents with neck pain, left-sided numbness, weakness and muscle spasm.  Pt states that she has been having posterior neck pain and lower back pain for a while. She went to see chiropractor yesterday. The chiropractor adjusted her neck. States that she started having headache, Left-sided numbness, left-sided weakness, muscle spasm in left arm and difficulty walking at about 11:00 AM. She states that her left face, ear and eye are numb. She does not have vision change or hearing loss. She states that her headache has subsided, but still has posterior neck pain, which is constant, mild, nonradiating. Patient does not have chest pain, shortness breath, cough. No fever or chills. She has nausea, but no vomiting, diarrhea or abdominal pain. Denies symptoms of UTI.  ED Course: pt was found to have WBC 10.5, negative pregnancy test, electrolytes renal function okay, temperature normal, no tachycardia, oxygen saturation 92-97% on room air. CT angiogram of neck and head is negative.Patient is placed on telemetry bed for observation. Tele neurology was consulted, Dr Rockwell Alexandria.  Review of Systems:   General: no fevers, chills, no body weight gain, has fatigue HEENT: no blurry vision, hearing changes or sore throat Respiratory: no dyspnea, coughing, wheezing CV: no chest pain, no palpitations GI: has nausea, no vomiting, abdominal pain, diarrhea, constipation GU: no dysuria, burning  on urination, increased urinary frequency, hematuria  Ext: no leg edema Neuro: His left sided numbness, weakness, muscle spasm in left arm. Skin: no rash, no skin tear. MSK: No muscle spasm, no deformity, no limitation of range of movement in spin Heme: No easy bruising.  Travel history: No recent long distant travel.  Allergy:  Allergies  Allergen Reactions  . Diflucan [Fluconazole] Itching and Rash  . Latex Itching and Rash    Past Medical History:  Diagnosis Date  . Asthma   . Bipolar 1 disorder (HCC)   . Cystitis, interstitial   . Depression    better now, situational, had pp with last child  . Dysthymic disorder   . Frequency of urination   . Headache(784.0)   . History of abnormal cervical Pap smear   . History of ovarian cyst   . History of pelvic inflammatory disease   . History of suicide attempt    06/ 2012  overdose oxycontin  . HSV-1 (herpes simplex virus 1) infection   . Nocturia   . Orthostatic hypotension   . Pelvic pain in female   . Vaginal Pap smear, abnormal    ok since    Past Surgical History:  Procedure Laterality Date  . CYSTO WITH HYDRODISTENSION N/A 02/25/2014   Procedure: CYSTOSCOPY/HYDRODISTENSION WITH INSTILLATION;  Surgeon: Martina Sinner, MD;  Location: [facility];  Service: Urology;  Laterality: N/A;  . DILATION AND CURETTAGE OF UTERUS  2011   W/ SUCTION  . TONSILLECTOMY  09-10-2007    Social History:  reports that she quit smoking about 2 years ago. Her smoking use included cigarettes. She has a 0.07 pack-year smoking history. she has never  used smokeless tobacco. She reports that she drinks alcohol. She reports that she does not use drugs.  Family History:  Family History  Adopted: Yes  Problem Relation Age of Onset  . Breast cancer Maternal Grandmother   . Anesthesia problems Neg Hx      Prior to Admission medications   Medication Sig Start Date End Date Taking? Authorizing Provider  Multiple Vitamin  (MULTIVITAMIN WITH MINERALS) TABS tablet Take 1 tablet by mouth daily.   Yes [provider]  trimethoprim (TRIMPEX) 100 MG tablet Take 100 mg by mouth daily. 02/07/17  Yes [provider]    Physical Exam: Vitals:   03/12/17 0217 03/12/17 0218 03/12/17 0248 03/12/17 0252  BP: 120/68   123/70  Pulse: 80   89  Resp: 20   20  Temp: 98.3 F (36.8 C) 98.4 F (36.9 C)  99.3 F (37.4 C)  TempSrc: Oral   Oral  SpO2: 98%   99%  Weight:   84.6 kg (186 lb 9.6 oz)   Height:   5\' 5"  (1.651 m)    General: Not in acute distress HEENT:       Eyes: PERRL, EOMI, no scleral icterus.       ENT: No discharge from the ears and nose, no pharynx injection, no tonsillar enlargement.        Neck: No JVD, no bruit, no mass felt. Heme: No neck lymph node enlargement. Cardiac: S1/S2, RRR, No murmurs, No gallops or rubs. Respiratory: No rales, wheezing, rhonchi or rubs. GI: Soft, nondistended, nontender, no rebound pain, no organomegaly, BS present. GU: No hematuria Ext: No pitting leg edema bilaterally. 2+DP/PT pulse bilaterally. Musculoskeletal: No joint deformities, No joint redness or warmth, no limitation of ROM in spin. Skin: No rashes.  Neuro: Alert, oriented X3, cranial nerves II-XII grossly intact. Left arm has contracture, muscle strength 2/5 in left arm and 3/5 in left leg. Sensation to light touch intact. Brachial reflex 2+ bilaterally. Negative Babinski's sign. Psych: Patient is not psychotic, no suicidal or hemocidal ideation.  Labs on Admission: I have personally reviewed following labs and imaging studies  CBC: Recent Labs  Lab 03/11/17 1748 03/12/17 0536  WBC 10.5 8.8  NEUTROABS 7.5  --   HGB 12.8 12.0  HCT 39.0 36.9  MCV 84.4 85.8  PLT 284 256   Basic Metabolic Panel: Recent Labs  Lab 03/11/17 1748  NA 142  K 4.4  CL 107  CO2 27  GLUCOSE 97  BUN 15  CREATININE 1.02*  CALCIUM 9.7   GFR: Estimated Creatinine Clearance: 88.9 mL/min (A) (by C-G  formula based on SCr of 1.02 mg/dL (H)). Liver Function Tests: No results for input(s): AST, ALT, ALKPHOS, BILITOT, PROT, ALBUMIN in the last 168 hours. No results for input(s): LIPASE, AMYLASE in the last 168 hours. No results for input(s): AMMONIA in the last 168 hours. Coagulation Profile: No results for input(s): INR, PROTIME in the last 168 hours. Cardiac Enzymes: No results for input(s): CKTOTAL, CKMB, CKMBINDEX, TROPONINI in the last 168 hours. BNP (last 3 results) No results for input(s): PROBNP in the last 8760 hours. HbA1C: No results for input(s): HGBA1C in the last 72 hours. CBG: No results for input(s): GLUCAP in the last 168 hours. Lipid Profile: No results for input(s): CHOL, HDL, LDLCALC, TRIG, CHOLHDL, LDLDIRECT in the last 72 hours. Thyroid Function Tests: No results for input(s): TSH, T4TOTAL, FREET4, T3FREE, THYROIDAB in the last 72 hours. Anemia Panel: No results for input(s): VITAMINB12, FOLATE, FERRITIN,  TIBC, IRON, RETICCTPCT in the last 72 hours. Urine analysis:    Component Value Date/Time   COLORURINE YELLOW 03/12/2017 0048   APPEARANCEUR CLEAR 03/12/2017 0048   LABSPEC >1.046 (H) 03/12/2017 0048   PHURINE 6.0 03/12/2017 0048   GLUCOSEU NEGATIVE 03/12/2017 0048   HGBUR MODERATE (A) 03/12/2017 0048   BILIRUBINUR NEGATIVE 03/12/2017 0048   KETONESUR 20 (A) 03/12/2017 0048   PROTEINUR NEGATIVE 03/12/2017 0048   UROBILINOGEN 0.2 11/15/2014 1757   NITRITE NEGATIVE 03/12/2017 0048   LEUKOCYTESUR MODERATE (A) 03/12/2017 0048   Sepsis Labs: @LABRCNTIP (procalcitonin:4,lacticidven:4) )No results found for this or any previous visit (from the past 240 hour(s)).   Radiological Exams on Admission: Ct Angio Head W Or Wo Contrast  Result Date: 03/11/2017 CLINICAL DATA:  Recent spinal manipulation. Neck pain. Left-sided neurological deficits. Concern for arterial dissection. EXAM: CT ANGIOGRAPHY HEAD AND NECK TECHNIQUE: Multidetector CT imaging of the head and  neck was performed using the standard protocol during bolus administration of intravenous contrast. Multiplanar CT image reconstructions and MIPs were obtained to evaluate the vascular anatomy. Carotid stenosis measurements (when applicable) are obtained utilizing NASCET criteria, using the distal internal carotid diameter as the denominator. CONTRAST:  454(639)0CypEld18rVenetia ConstEden [place]161(2Step55Ga oy454(564CypEld93rVenetia ConstEden [place]1619Step25[place] al454(952)CypEld11rVenetia ConstEden [place]1617Step74 do454(541) CypEld16rVenetia ConstEden [place]1618Step49Fle bu454959-32CypEld67rVenetia ConstEden [place]1618Step33 rm454832-2CypEld4rVenetia ConstEden [place]1619Step36 ba454581-CypEld80rVenetia ConstEden [place]1616Step51[place]454(865)1CypEld40rVenetia ConstEden [place]161(4Step12 pm454(573)CypEld32rVenetia ConstEden [place]1613Step73[place]454(413)CypEld6rVenetia ConstEden [place]1614Step66H eo454(629) CypEld67rVenetia ConstEden [place]161(2Step31Middle ll454(905)08CypEld64rVenetia ConstEden [place]161(6Step89B ch454862-CypEld13rVenetia ConstEden [place]1619Step68C nt454(530)1CypEld4rVenetia ConstEden [place]1615Step27Stu ga454(660)0CypEld35rVenetia ConstEden [place]16Step19Laf et454631-CypEld92rVenetia ConstEden [place]16Step40T<MEASUREMENCypEldEden [place]161(6Step10[place] ey454959-CypEld9rVenetia ConstEden [place]161(40Step6 re454(705)CypEld26rVenetia ConstEden [place]16Step60 ee454506-2CypEld22rVenetia ConstEden [place]1616Step16Sh<MEASUREMENCypEld40rVenetia ConstEden [place]16Step47[place] Coup3aurnslterIDOL (ISOVUE-370) INJECTION 76% COMPARISON:  Head CT 01/24/2012 FINDINGS: CT HEAD FINDINGS Brain: There is no evidence of acute infarct, intracranial hemorrhage, mass, midline shift, or extra-axial fluid collection. The ventricles and sulci are normal. Vascular: No hyperdense vessel. Skull: No fracture or focal osseous lesion. Sinuses: Visualized paranasal sinuses and mastoid air cells are clear. Orbits: Unremarkable. Review of the MIP images confirms the above findings CTA NECK FINDINGS Aortic arch: Normal variant aortic arch branching pattern with common origin of the brachiocephalic and left common carotid arteries. Widely patent brachiocephalic and subclavian arteries. Right carotid system: Patent without evidence of stenosis, dissection, or significant atherosclerosis. Left carotid system: Patent without evidence of stenosis, dissection, or significant atherosclerosis. Vertebral arteries: Patent without evidence of stenosis, dissection, or significant atherosclerosis. Mildly to moderately dominant left vertebral artery. Skeleton: No acute osseous abnormality or suspicious osseous lesion. No evidence of significant cervical spondylosis. Other neck: No mass or lymph node enlargement. Upper chest: Clear lung apices. Review of the MIP images confirms the above findings CTA HEAD FINDINGS Anterior circulation: The internal carotid arteries are widely patent from skullbase to carotid termini. ACAs and MCAs are patent without evidence of proximal branch occlusion or significant stenosis. The right A1 segment is markedly hypoplastic. No aneurysm.  Posterior circulation: The intracranial vertebral arteries are patent to the basilar. A patent right PICA is identified. A left PICA is not clearly identified. AICAs and SCAs are small and not well evaluated. Basilar artery is widely patent. There is a small right posterior communicating artery. PCAs are patent without evidence of significant stenosis. No aneurysm. Venous sinuses: Patent. Anatomic variants: Hypoplastic right A1. Delayed phase: No abnormal enhancement. Review of the MIP images confirms the above findings IMPRESSION: Negative head and neck CTA.  No evidence of dissection. Electronically Signed   By: Allen  Grady M.D.   On: 03/11/2017 20:15   Ct Angio Neck W And/or Wo Contrast  Result Date: 03/11/2017 CLINICAL DATA:  Recent spinal manipulation. Neck pain. Left-sided neurological deficits. Concern for arterial dissection. EXAM: CT ANGIOGRAPHY HEAD AND NECK TECHNIQUE: Multidetector CT imaging of the head and neck was performed using the standard protocol during bolus administration of intravenous contrast. Multiplanar CT image reconstructions and MIPs were obtained to evaluate the vascular anatomy. Carotid stenosis measurements (when applicable) are  obtained utilizing NASCET criteria, using the distal internal carotid diameter as the denominator. CONTRAST:  100mL ISOVUE-370 IOPAMIDOL (ISOVUE-370) INJECTION 76% COMPARISON:  Head CT 01/24/2012 FINDINGS: CT HEAD FINDINGS Brain: There is no evidence of acute infarct, intracranial hemorrhage, mass, midline shift, or extra-axial fluid collection. The ventricles and sulci are normal. Vascular: No hyperdense vessel. Skull: No fracture or focal osseous lesion. Sinuses: Visualized paranasal sinuses and mastoid air cells are clear. Orbits: Unremarkable. Review of the MIP images confirms the above findings CTA NECK FINDINGS Aortic arch: Normal variant aortic arch branching pattern with common origin of the brachiocephalic and left common carotid arteries.  Widely patent brachiocephalic and subclavian arteries. Right carotid system: Patent without evidence of stenosis, dissection, or significant atherosclerosis. Left carotid system: Patent without evidence of stenosis, dissection, or significant atherosclerosis. Vertebral arteries: Patent without evidence of stenosis, dissection, or significant atherosclerosis. Mildly to moderately dominant left vertebral artery. Skeleton: No acute osseous abnormality or suspicious osseous lesion. No evidence of significant cervical spondylosis. Other neck: No mass or lymph node enlargement. Upper chest: Clear lung apices. Review of the MIP images confirms the above findings CTA HEAD FINDINGS Anterior circulation: The internal carotid arteries are widely patent from skullbase to carotid termini. ACAs and MCAs are patent without evidence of proximal branch occlusion or significant stenosis. The right A1 segment is markedly hypoplastic. No aneurysm. Posterior circulation: The intracranial vertebral arteries are patent to the basilar. A patent right PICA is identified. A left PICA is not clearly identified. AICAs and SCAs are small and not well evaluated. Basilar artery is widely patent. There is a small right posterior communicating artery. PCAs are patent without evidence of significant stenosis. No aneurysm. Venous sinuses: Patent. Anatomic variants: Hypoplastic right A1. Delayed phase: No abnormal enhancement. Review of the MIP images confirms the above findings IMPRESSION: Negative head and neck CTA.  No evidence of dissection. Electronically Signed   By: Sebastian AcheAllen  Grady M.D.   On: 03/11/2017 20:15     EKG: Not done in ED, will get one.   Assessment/Plan Principal Problem:   Left sided numbness Active Problems:   Adjustment disorder with depressed mood   Anxiety   Neck pain   Interstitial cystitis   UTI (urinary tract infection)   Left sided numbness, Weakness and the left arm muscle spasm: Etiology is not clear. Tele  neurology was consulted, Dr Rockwell Alexandriaolon-Garcia recommended to get MRI and CT angiogram of  head and neck to rule out dissection. CT angiogram is negative for dissection. Per Dr. Rockwell Alexandriaolon-Garcia, dystonia and functional causes are also on the DDx.   -will place on tele bed for obs -get MRI of brain and neck without contrast -Swallow screen -pt/ot -robaxin prn for Muscle spasm -check UDS -if MRI is negative, [facility]  Adjustment disorder with depressed mood and anxiety: pt is not taking meds at home -prn Xanax  Neck pain: CTA of neck is negative. -When necessary Tylenol and ibuprofen for neck pain -f/u MRI-neck  Interstitial cystitis: -Continue trimethoprim -Check urinalysis  Addendum: UA is positive for UTI -will start rocephine -d/c TMP -f/u Bx abd Ux   DVT ppx: SQ Lovenox Code Status: Full code Family Communication: None at bed side.   Disposition Plan:  Anticipate discharge back to previous home environment Consults called:  none Admission status: Obs / tele  Date of Service 03/12/2017    Lorretta HarpXilin Bita Cartwright [facility] Pager [phone]  If 7PM-7AM, please contact night-coverage [email or web address] Password [email or web address] 03/12/2017, 6:44 AM

## 2017-03-13 ENCOUNTER — Encounter (HOSPITAL_COMMUNITY): Payer: Self-pay | Admitting: *Deleted

## 2017-03-13 ENCOUNTER — Observation Stay (HOSPITAL_BASED_OUTPATIENT_CLINIC_OR_DEPARTMENT_OTHER)
Admit: 2017-03-13 | Discharge: 2017-03-13 | Disposition: A | Discharge status: Home / Self Care | Payer: Medicaid Other | Attending: Internal Medicine | Admitting: Internal Medicine

## 2017-03-13 DIAGNOSIS — R531 Weakness: Secondary | ICD-10-CM | POA: Diagnosis present

## 2017-03-13 DIAGNOSIS — G40909 Epilepsy, unspecified, not intractable, without status epilepticus: Secondary | ICD-10-CM | POA: Diagnosis not present

## 2017-03-13 DIAGNOSIS — M62838 Other muscle spasm: Secondary | ICD-10-CM | POA: Diagnosis present

## 2017-03-13 DIAGNOSIS — Z791 Long term (current) use of non-steroidal anti-inflammatories (NSAID): Secondary | ICD-10-CM | POA: Diagnosis not present

## 2017-03-13 DIAGNOSIS — Z79899 Other long term (current) drug therapy: Secondary | ICD-10-CM | POA: Diagnosis not present

## 2017-03-13 DIAGNOSIS — M542 Cervicalgia: Secondary | ICD-10-CM | POA: Diagnosis present

## 2017-03-13 DIAGNOSIS — N3010 Interstitial cystitis (chronic) without hematuria: Secondary | ICD-10-CM | POA: Diagnosis present

## 2017-03-13 DIAGNOSIS — F419 Anxiety disorder, unspecified: Secondary | ICD-10-CM | POA: Diagnosis not present

## 2017-03-13 DIAGNOSIS — Z87891 Personal history of nicotine dependence: Secondary | ICD-10-CM | POA: Diagnosis not present

## 2017-03-13 DIAGNOSIS — J45909 Unspecified asthma, uncomplicated: Secondary | ICD-10-CM | POA: Diagnosis not present

## 2017-03-13 DIAGNOSIS — R200 Anesthesia of skin: Secondary | ICD-10-CM | POA: Diagnosis present

## 2017-03-13 DIAGNOSIS — M5136 Other intervertebral disc degeneration, lumbar region: Secondary | ICD-10-CM | POA: Diagnosis not present

## 2017-03-13 DIAGNOSIS — F4321 Adjustment disorder with depressed mood: Secondary | ICD-10-CM | POA: Diagnosis not present

## 2017-03-13 DIAGNOSIS — G40409 Other generalized epilepsy and epileptic syndromes, not intractable, without status epilepticus: Secondary | ICD-10-CM | POA: Diagnosis present

## 2017-03-13 DIAGNOSIS — R569 Unspecified convulsions: Secondary | ICD-10-CM | POA: Diagnosis not present

## 2017-03-13 DIAGNOSIS — Z91040 Latex allergy status: Secondary | ICD-10-CM | POA: Diagnosis not present

## 2017-03-13 DIAGNOSIS — R258 Other abnormal involuntary movements: Secondary | ICD-10-CM | POA: Diagnosis not present

## 2017-03-13 DIAGNOSIS — M545 Low back pain: Secondary | ICD-10-CM | POA: Diagnosis present

## 2017-03-13 DIAGNOSIS — F4323 Adjustment disorder with mixed anxiety and depressed mood: Secondary | ICD-10-CM | POA: Diagnosis present

## 2017-03-13 LAB — CBC WITH DIFFERENTIAL/PLATELET
BASOS ABS: 0 10*3/uL (ref 0.0–0.1)
Basophils Relative: 0 %
Eosinophils Absolute: 0.2 10*3/uL (ref 0.0–0.7)
Eosinophils Relative: 3 %
HEMATOCRIT: 39 % (ref 36.0–46.0)
Hemoglobin: 12.4 g/dL (ref 12.0–15.0)
LYMPHS ABS: 2.5 10*3/uL (ref 0.7–4.0)
LYMPHS PCT: 35 %
MCH: 27.6 pg (ref 26.0–34.0)
MCHC: 31.8 g/dL (ref 30.0–36.0)
MCV: 86.9 fL (ref 78.0–100.0)
MONO ABS: 0.5 10*3/uL (ref 0.1–1.0)
Monocytes Relative: 7 %
NEUTROS ABS: 4 10*3/uL (ref 1.7–7.7)
Neutrophils Relative %: 55 %
Platelets: 246 10*3/uL (ref 150–400)
RBC: 4.49 MIL/uL (ref 3.87–5.11)
RDW: 12.8 % (ref 11.5–15.5)
WBC: 7.3 10*3/uL (ref 4.0–10.5)

## 2017-03-13 LAB — BASIC METABOLIC PANEL
ANION GAP: 11 (ref 5–15)
BUN: 19 mg/dL (ref 6–20)
CHLORIDE: 105 mmol/L (ref 101–111)
CO2: 24 mmol/L (ref 22–32)
Calcium: 9.2 mg/dL (ref 8.9–10.3)
Creatinine, Ser: 0.77 mg/dL (ref 0.44–1.00)
GFR calc Af Amer: >60 mL/min (ref >60)
GLUCOSE: 105 mg/dL — AB (ref 65–99)
POTASSIUM: 3.8 mmol/L (ref 3.5–5.1)
Sodium: 140 mmol/L (ref 135–145)

## 2017-03-13 LAB — GLUCOSE, CAPILLARY: Glucose-Capillary: 96 mg/dL (ref 65–99)

## 2017-03-13 LAB — HIV ANTIBODY (ROUTINE TESTING W REFLEX): HIV SCREEN 4TH GENERATION: NONREACTIVE

## 2017-03-13 LAB — URINE CULTURE: Culture: <10000 — AB

## 2017-03-13 LAB — MAGNESIUM: Magnesium: 1.9 mg/dL (ref 1.7–2.4)

## 2017-03-13 MED ORDER — LEVETIRACETAM 500 MG/5ML IV SOLN
1000.0000 mg | Freq: Once | INTRAVENOUS | Status: AC
  Administered 2017-03-13: 1000 mg via INTRAVENOUS
  Filled 2017-03-13: qty 10

## 2017-03-13 MED ORDER — SODIUM CHLORIDE 0.9 % IV SOLN: 500.0000 mg | Freq: Two times a day (BID) | INTRAVENOUS | Status: DC

## 2017-03-13 MED ORDER — LEVETIRACETAM 500 MG PO TABS
500.0000 mg | ORAL_TABLET | Freq: Two times a day (BID) | ORAL | Status: DC
  Administered 2017-03-13 – 2017-03-14 (×2): 500 mg via ORAL
  Filled 2017-03-13 (×2): qty 1

## 2017-03-13 NOTE — Progress Notes (Signed)
PROGRESS NOTE    Kathy [name]  ZOX:[id number] DOB: 1990-06-11 DOA: 03/11/2017 [place]CP: [place], [facility]   Brief Narrative:  Patient is a 26 year old female history of asthma, depression, anxiety, bipolar disorder, adjustment disorder presented with neck pain, left-sided numbness and weakness and muscle spasms/jerking.  Patient had her neck recently adjusted by chiropractor and started having headache and left-sided numbness left-sided weakness and muscle spasm/jerking in the left upper extremity with difficulty ambulating.  Patient was seen in the ED MRI of the head and MRI of the C-spine was unremarkable.  Neurology was consulted and initially felt patient may have had a complex migraine.  EEG done was consistent with underlying seizure focus.  Patient started on Keppra.   Assessment & Plan:   Principal Problem:   Seizures (HCC) Active Problems:   Left sided numbness   Adjustment disorder with depressed mood   Anxiety   Neck pain   Interstitial cystitis   UTI (urinary tract infection)   Bad headache  #1 left-sided numbness/jerking:Secondary to seizures  Patient presented with some left-sided weakness and numbness as well as some left-sided jerking with a headache noted.  CT head, MRI head, MRI C-spine are unremarkable.  Patient had no signs or symptoms of infection and workup for infection was negative.  Pregnancy was negative.  Neurology was consulted saw the patient on 03/12/2017 while EEG was pending and it was felt patient's symptoms could have been from a complex migraine.  Patient was placed on a cocktail of IV Compazine, IV Toradol, IV Benadryl with clinical improvement.  Patient improved clinically.  EEG was done on 03/13/2017 which was consistent with a diagnosis of primary generalized epilepsy per neurology.  Case was discussed with neurology, Dr Otelia LimesLindzen and patient was loaded with IV Keppra and placed on maintenance dose of Keppra.  Will monitor  patient overnight.  [place] statutes, patients with seizures are not allowed to drive until  they have been seizure-free for six months. Use caution when using heavy equipment or power tools. Avoid working on ladders or at heights. Take showers instead of baths. Ensure the water temperature is not too high on the home water heater. Do not go swimming alone. When caring for infants or small children, sit down when holding, feeding, or changing them to minimize risk of injury to the child in the event you have a seizure. Also, Maintain good sleep hygiene. Avoid alcohol. Seizure precautions were discussed with patient. Needs outpatient follow-up with neurology in 3-4 weeks.  2.  Adjustment disorder with depressed mood Stable.  3.  Anxiety Remained stable.   DVT prophylaxis: Lovenox Code Status: Full Family Communication: Updated patient.  No family at bedside. Disposition Plan: Likely home tomorrow if no further episodes and patient tolerates Keppra.     Consultants:   Neurology: Dr. Amada JupiterKirkpatrick 03/12/2017  Procedures:   EEG 03/13/2017 pending  MRI head MRI C-spine 03/12/2017  CT angiogram head and neck 03/11/2017    Antimicrobials:  None   Subjective: Patient laying in bed.  Patient states some improvement with headache.  Patient states weakness on the left side and tingling have improved since admission.  States jerking on the left side has improved.  No shortness of breath.  No chest pain.  Objective: Vitals:   03/12/17 0645 03/12/17 1411 03/12/17 2217 03/13/17 0526  BP: 109/64 113/67 (!) 102/48 (!) 105/48  Pulse: (!) 58 64 60 (!) 54  Resp: 20 18 16 16   Temp: 97.7 F (  36.5 C) 98.5 F (36.9 C) 97.9 F (36.6 C) 97.9 F (36.6 C)  TempSrc: Oral Axillary Oral Oral  SpO2: 100% 99% 98% 96%  Weight:      Height:       No intake or output data in the 24 hours ending 03/13/17 1742 Filed Weights   03/12/17 0248  Weight: 84.6 kg (186 lb 9.6 oz)     Examination:  General exam: Appears calm and comfortable  Respiratory system: Clear to auscultation. Respiratory effort normal. Cardiovascular system: S1 & S2 heard, RRR. No JVD, murmurs, rubs, gallops or clicks. No pedal edema. Gastrointestinal system: Abdomen is nondistended, soft and nontender. No organomegaly or masses felt. Normal bowel sounds heard. Central nervous system: Alert and oriented. No focal neurological deficits. Extremities: Symmetric 5 x 5 power. Skin: No rashes, lesions or ulcers Psychiatry: Judgement and insight appear normal. Mood & affect appropriate.     Data Reviewed: I have personally reviewed following labs and imaging studies  CBC: Recent Labs  Lab 03/11/17 1748 03/12/17 0536 03/13/17 0544  WBC 10.5 8.8 7.3  NEUTROABS 7.5  --  4.0  HGB 12.8 12.0 12.4  HCT 39.0 36.9 39.0  MCV 84.4 85.8 86.9  PLT 284 256 246   Basic Metabolic Panel: Recent Labs  Lab 03/11/17 1748 03/12/17 0536 03/13/17 0544  NA 142 137 140  K 4.4 3.4* 3.8  CL 107 103 105  CO2 27 27 24   GLUCOSE 97 93 105*  BUN 15 14 19   CREATININE 1.02* 0.83 0.77  CALCIUM 9.7 9.1 9.2  MG  --   --  1.9   GFR: Estimated Creatinine Clearance: 113.4 mL/min (by C-G formula based on SCr of 0.77 mg/dL). Liver Function Tests: No results for input(s): AST, ALT, ALKPHOS, BILITOT, PROT, ALBUMIN in the last 168 hours. No results for input(s): LIPASE, AMYLASE in the last 168 hours. No results for input(s): AMMONIA in the last 168 hours. Coagulation Profile: No results for input(s): INR, PROTIME in the last 168 hours. Cardiac Enzymes: No results for input(s): CKTOTAL, CKMB, CKMBINDEX, TROPONINI in the last 168 hours. BNP (last 3 results) No results for input(s): PROBNP in the last 8760 hours. HbA1C: No results for input(s): HGBA1C in the last 72 hours. CBG: Recent Labs  Lab 03/12/17 0731 03/13/17 0804  GLUCAP 82 96   Lipid Profile: No results for input(s): CHOL, HDL, LDLCALC, TRIG,  CHOLHDL, LDLDIRECT in the last 72 hours. Thyroid Function Tests: No results for input(s): TSH, T4TOTAL, FREET4, T3FREE, THYROIDAB in the last 72 hours. Anemia Panel: No results for input(s): VITAMINB12, FOLATE, FERRITIN, TIBC, IRON, RETICCTPCT in the last 72 hours. Sepsis Labs: No results for input(s): PROCALCITON, LATICACIDVEN in the last 168 hours.  Recent Results (from the past 240 hour(s))  Urine Culture     Status: Abnormal   Collection Time: 03/12/17  6:44 AM  Result Value Ref Range Status   Specimen Description URINE, RANDOM  Final   Special Requests NONE  Final   Culture (A)  Final    <10,000 COLONIES/mL INSIGNIFICANT GROWTH Performed at [facility], [address] [place], [place] [address]    Report Status 03/13/2017 FINAL  Final  Culture, blood (Routine X 2) w Reflex to ID Panel     Status: None (Preliminary result)   Collection Time: 03/12/17  7:03 AM  Result Value Ref Range Status   Specimen Description BLOOD RIGHT HAND  Final   Special Requests   Final    BOTTLES DRAWN  AEROBIC AND ANAEROBIC Blood Culture adequate volume   Culture   Final    NO GROWTH 1 DAY Performed at [facility], [address] [place], [place] [address]    Report Status PENDING  Incomplete  Culture, blood (Routine X 2) w Reflex to ID Panel     Status: None (Preliminary result)   Collection Time: 03/12/17  7:12 AM  Result Value Ref Range Status   Specimen Description BLOOD LEFT HAND  Final   Special Requests   Final    BOTTLES DRAWN AEROBIC AND ANAEROBIC Blood Culture adequate volume   Culture   Final    NO GROWTH 1 DAY Performed at [facility], [address] [place], [place] [address]    Report Status PENDING  Incomplete         Radiology Studies: Ct Angio Head W Or Wo Contrast  Result Date: 03/11/2017 CLINICAL DATA:  Recent spinal manipulation. Neck pain. Left-sided neurological deficits. Concern for arterial dissection. EXAM: CT ANGIOGRAPHY HEAD AND NECK  TECHNIQUE: Multidetector CT imaging of the head and neck was performed using the standard protocol during bolus administration of intravenous contrast. Multiplanar CT image reconstructions and MIPs were obtained to evaluate the vascular anatomy. Carotid stenosis measurements (when applicable) are obtained utilizing NASCET criteria, using the distal internal carotid diameter as the denominator. CONTRAST:  ISOVUE-370 IOPAMIDOL (ISOVUE-370) INJECTION 76% COMPARISON:  Head CT 01/24/2012 FINDINGS: CT HEAD FINDINGS Brain: There is no evidence of acute infarct, intracranial hemorrhage, mass, midline shift, or extra-axial fluid collection. The ventricles and sulci are normal. Vascular: No hyperdense vessel. Skull: No fracture or focal osseous lesion. Sinuses: Visualized paranasal sinuses and mastoid air cells are clear. Orbits: Unremarkable. Review of the MIP images confirms the above findings CTA NECK FINDINGS Aortic arch: Normal variant aortic arch branching pattern with common origin of the brachiocephalic and left common carotid arteries. Widely patent brachiocephalic and subclavian arteries. Right carotid system: Patent without evidence of stenosis, dissection, or significant atherosclerosis. Left carotid system: Patent without evidence of stenosis, dissection, or significant atherosclerosis. Vertebral arteries: Patent without evidence of stenosis, dissection, or significant atherosclerosis. Mildly to moderately dominant left vertebral artery. Skeleton: No acute osseous abnormality or suspicious osseous lesion. No evidence of significant cervical spondylosis. Other neck: No mass or lymph node enlargement. Upper chest: Clear lung apices. Review of the MIP images confirms the above findings CTA HEAD FINDINGS Anterior circulation: The internal carotid arteries are widely patent from skullbase to carotid termini. ACAs and MCAs are patent without evidence of proximal branch occlusion or significant stenosis. The right  A1 segment is markedly hypoplastic. No aneurysm. Posterior circulation: The intracranial vertebral arteries are patent to the basilar. A patent right PICA is identified. A left PICA is not clearly identified. AICAs and SCAs are small and not well evaluated. Basilar artery is widely patent. There is a small right posterior communicating artery. PCAs are patent without evidence of significant stenosis. No aneurysm. Venous sinuses: Patent. Anatomic variants: Hypoplastic right A1. Delayed phase: No abnormal enhancement. Review of the MIP images confirms the above findings IMPRESSION: Negative head and neck CTA.  No evidence of dissection. Electronically Signed   By: Sebastian Ache M.D.   On: 03/11/2017 20:15   Ct Angio Neck W And/or Wo Contrast  Result Date: 03/11/2017 CLINICAL DATA:  Recent spinal manipulation. Neck pain. Left-sided neurological deficits. Concern for arterial dissection. EXAM: CT ANGIOGRAPHY HEAD AND NECK TECHNIQUE: Multidetector CT imaging of the head and neck was performed  using the standard protocol during bolus administration of intravenous contrast. Multiplanar CT image reconstructions and MIPs were obtained to evaluate the vascular anatomy. Carotid stenosis measurements (when applicable) are obtained utilizing NASCET criteria, using the distal internal carotid diameter as the denominator. CONTRAST:  ISOVUE-370 IOPAMIDOL (ISOVUE-370) INJECTION 76% COMPARISON:  Head CT 01/24/2012 FINDINGS: CT HEAD FINDINGS Brain: There is no evidence of acute infarct, intracranial hemorrhage, mass, midline shift, or extra-axial fluid collection. The ventricles and sulci are normal. Vascular: No hyperdense vessel. Skull: No fracture or focal osseous lesion. Sinuses: Visualized paranasal sinuses and mastoid air cells are clear. Orbits: Unremarkable. Review of the MIP images confirms the above findings CTA NECK FINDINGS Aortic arch: Normal variant aortic arch branching pattern with common origin of the  brachiocephalic and left common carotid arteries. Widely patent brachiocephalic and subclavian arteries. Right carotid system: Patent without evidence of stenosis, dissection, or significant atherosclerosis. Left carotid system: Patent without evidence of stenosis, dissection, or significant atherosclerosis. Vertebral arteries: Patent without evidence of stenosis, dissection, or significant atherosclerosis. Mildly to moderately dominant left vertebral artery. Skeleton: No acute osseous abnormality or suspicious osseous lesion. No evidence of significant cervical spondylosis. Other neck: No mass or lymph node enlargement. Upper chest: Clear lung apices. Review of the MIP images confirms the above findings CTA HEAD FINDINGS Anterior circulation: The internal carotid arteries are widely patent from skullbase to carotid termini. ACAs and MCAs are patent without evidence of proximal branch occlusion or significant stenosis. The right A1 segment is markedly hypoplastic. No aneurysm. Posterior circulation: The intracranial vertebral arteries are patent to the basilar. A patent right PICA is identified. A left PICA is not clearly identified. AICAs and SCAs are small and not well evaluated. Basilar artery is widely patent. There is a small right posterior communicating artery. PCAs are patent without evidence of significant stenosis. No aneurysm. Venous sinuses: Patent. Anatomic variants: Hypoplastic right A1. Delayed phase: No abnormal enhancement. Review of the MIP images confirms the above findings IMPRESSION: Negative head and neck CTA.  No evidence of dissection. Electronically Signed   By: Sebastian Ache M.D.   On: 03/11/2017 20:15   Mr Brain Wo Contrast  Result Date: 03/12/2017 CLINICAL DATA:  26 year old female with acute onset left side numbness in weakness yesterday while shopping. Persistent symptoms. Neck pain. EXAM: MRI HEAD WITHOUT CONTRAST TECHNIQUE: Multiplanar, multiecho pulse sequences of the brain and  surrounding structures were obtained without intravenous contrast. COMPARISON:  CTA head and neck 03/11/2017. Head CT without contrast 01/24/2012. FINDINGS: Brain: No restricted diffusion to suggest acute infarction. No midline shift, mass effect, evidence of mass lesion, ventriculomegaly, extra-axial collection or acute intracranial hemorrhage. Cervicomedullary junction and pituitary are within normal limits. Wallace Cullens and white matter signal is within normal limits throughout the brain. No encephalomalacia or chronic cerebral blood products identified. Deep gray matter nuclei, brainstem, and cerebellum appear normal. Vascular: Major intracranial vascular flow voids are preserved. Skull and upper cervical spine: Negative visualized cervical spine. Capacious visible cervical spinal canal. Normal bone marrow signal. Sinuses/Orbits: Normal orbits soft tissues. Paranasal sinuses and mastoids are stable and well pneumatized. Other: Visible internal auditory structures appear normal. Visible scalp and face soft tissues appear negative. IMPRESSION: Normal MRI appearance of the brain. Electronically Signed   By: Odessa Fleming M.D.   On: 03/12/2017 10:43   Mr Cervical Spine Wo Contrast  Result Date: 03/12/2017 CLINICAL DATA:  Left-sided numbness and weakness. EXAM: MRI CERVICAL SPINE WITHOUT CONTRAST TECHNIQUE: Multiplanar, multisequence MR imaging of the cervical spine  was performed. No intravenous contrast was administered. COMPARISON:  None. FINDINGS: Alignment: Normal Vertebrae: Normal marrow signal except for a small C6 hemangioma. No fracture or worrisome bone lesion. Cord: Normal signal intensity.  No cord lesion or syrinx. Posterior Fossa, vertebral arteries, paraspinal tissues: No significant findings. Disc levels: No disc protrusions, spinal or foraminal stenosis. IMPRESSION: Unremarkable cervical spine MRI examination. Electronically Signed   By: Rudie MeyerP.  Gallerani M.D.   On: 03/12/2017 11:02        Scheduled  Meds: . diphenhydrAMINE  12.5 mg Oral Q12H   And  . prochlorperazine  10 mg Intravenous Q6H  . enoxaparin (LOVENOX) injection  40 mg Subcutaneous Q24H  . levETIRAcetam  500 mg Oral BID  . LORazepam  1 mg Oral Once  . multivitamin with minerals  1 tablet Oral Daily  . pantoprazole  40 mg Oral Q0600   Continuous Infusions: . cefTRIAXone (ROCEPHIN)  IV Stopped (03/13/17 0556)  . levETIRAcetam       LOS: 0 days    Time spent: 35 minutes    Ramiro Harvestaniel Thompson, MD [facility] Pager [phone]  If 7PM-7AM, please contact night-coverage [email or web address] Password [email or web address] 03/13/2017, 5:42 PM

## 2017-03-13 NOTE — Procedures (Signed)
ELECTROENCEPHALOGRAM REPORT  Date of Study: 03/13/2017  Patient's Name: Kathy [name] MRN: [id number] Date of Birth: June 05, 1990  Referring Provider: Dr. Ramiro Harvestaniel Thompson  Clinical History: This is a 26 year old woman with left sided numbness and weakness as well as spasms/jerks in the arm  Medications: Tylenol Xanax Ceftriaxone Robaxin Compazine  Technical Summary: A multichannel digital EEG recording measured by the international 10-20 system with electrodes applied with paste and impedances below 5000 ohms performed in our laboratory with EKG monitoring in an awake and drowsy patient.  Hyperventilation and photic stimulation were not performed.  The digital EEG was referentially recorded, reformatted, and digitally filtered in a variety of bipolar and referential montages for optimal display.    Description: The patient is awake and drowsy during the recording.  During maximal wakefulness, there is a symmetric, medium voltage 9 Hz posterior dominant rhythm that attenuates with eye opening.  The record is symmetric.  During drowsiness, there is an increase in theta slowing of the background. Deeper stages of sleep were not seen. Hyperventilation and photic stimulation were not performed. There were occasional 2-4 second bursts of generalized high voltage 4 Hz spike and polyspike and wave discharges seen without clinical correlate reported.  There were no electrographic seizures seen.    EKG lead was unremarkable.  Impression: This awake and drowsy EEG is abnormal due to the presence of occasional bursts of generalized high voltage 4 Hz spike and polyspike and wave discharges.  Clinical Correlation of the above findings is consistent with a diagnosis of primary generalized epilepsy. Clinical correlation is advised.   Kathy [name], M.D.

## 2017-03-13 NOTE — Progress Notes (Signed)
Offsite EEG completed, results pending. 

## 2017-03-13 NOTE — Evaluation (Signed)
Occupational Therapy Evaluation Patient Details Name: Kathy SpenceChelsie V L Faraone MRN: [id number] DOB: Nov 10, 1990 Today's Date: 03/13/2017    History of Present Illness 26 y.o. female with a history of bipolar disorder who presents with left-sided weakness and headache.  She went to see a chiropractor last week and following this, she started having a headache.  She then began starting to have left sided numbness and weakness as well as spasms in the arm.    Clinical Impression   Pt does present with decreased strength LUE.  Educated pt in exercises to increase strength as well as going to OP OT if pt does not feel like this resolves.    Follow Up Recommendations  Outpatient OT          Precautions / Restrictions Precautions Precautions: None Restrictions Weight Bearing Restrictions: No      Mobility Bed Mobility Overal bed mobility: Independent                Transfers Overall transfer level: Independent                    Balance Overall balance assessment: Independent                                         ADL either performed or assessed with clinical judgement   ADL Overall ADL's : At baseline                                             Vision Patient Visual Report: No change from baseline              Pertinent Vitals/Pain Pain Assessment: 0-10 Pain Score: 2  Pain Location: mid thoracic area Pain Descriptors / Indicators: Spasm Pain Intervention(s): Limited activity within patient's tolerance;Monitored during session;Ice applied     Hand Dominance Right   Extremity/Trunk Assessment Upper Extremity Assessment Upper Extremity Assessment: LUE deficits/detail LUE Deficits / Details: shoulder elevation AROM 80* limited by pain, grip 4/5, elbow AROM WNL, decreased sensation to light touch L hand LUE: Unable to fully assess due to pain LUE Sensation: decreased light touch   Lower Extremity Assessment Lower  Extremity Assessment: LLE deficits/detail LLE Deficits / Details: decreased sensation to light touch L foot, ankle DF -4/5, knee ext -4/5 LLE Sensation: decreased light touch   Cervical / Trunk Assessment Cervical / Trunk Assessment: Normal   Communication Communication Communication: No difficulties   Cognition Arousal/Alertness: Awake/alert Behavior During Therapy: WFL for tasks assessed/performed Overall Cognitive Status: Within Functional Limits for tasks assessed                                                Home Living Family/patient expects to be discharged to:: Private residence Living Arrangements: Children(lives with 3 children ages 426 and under) Available Help at Discharge: Family;Available PRN/intermittently(mother is local) Type of Home: Apartment Home Access: Stairs to enter Entrance Stairs-Number of Steps: flight Entrance Stairs-Rails: Right Home Layout: One level               Home Equipment: None          Prior Functioning/Environment Level  of Independence: Independent        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        OT Problem List: Decreased strength;Decreased range of motion      OT Treatment/Interventions:      OT Goals(Current goals can be found in the care plan section) Acute Rehab OT Goals Patient Stated Goal: return to work in daycare OT Goal Formulation: With patient  OT Frequency:      AM-PAC PT "6 Clicks" Daily Activity     Outcome Measure Help from another person eating meals?: None Help from another person taking care of personal grooming?: None Help from another person toileting, which includes using toliet, bedpan, or urinal?: None Help from another person bathing (including washing, rinsing, drying)?: None Help from another person to put on and taking off regular upper body clothing?: None Help from another person to put on and taking off regular lower body clothing?: None 6 Click  Score: 24   End of Session Nurse Communication: Mobility status  Activity Tolerance: Patient tolerated treatment well Patient left: in bed  OT Visit Diagnosis: Muscle weakness (generalized) (M62.81)                Time: [id number] OT Time Calculation (min): 20 min Charges:  OT General Charges $OT Visit: 1 Visit OT Evaluation $OT Eval Moderate Complexity: 1 Mod G-Codes:     Lise AuerLori Aizlynn Digilio, OT [phone]  Einar CrowEDDING, Shatia Sindoni D 03/13/2017, 1:42 PM

## 2017-03-13 NOTE — Progress Notes (Signed)
EEG completed and report reviewed. The EEG is abnormal due to the presence of occasional bursts of generalized high voltage 4 Hz spike and polyspike and wave discharges. Clinical correlation of the findings is consistent with a diagnosis of primary generalized epilepsy.   Recommendations: 1. Keppra 1000 mg IV load. Followed by scheduled PO Keppra at 500 mg BID 2. Keppra is generally recognized to be among the seizure medications with lowest teratogenic potential. However, if intending to become pregnant, she should discuss risks/benefits of pregnancy while on versus off an anticonvulsant with her Ob/Gyn and/or outpatient Neurologist.  3. Outpatient Neurology follow up within 3-4 weeks 4. Per [facility] [place] [place] statutes, patients with seizures are not allowed to drive until  they have been seizure-free for six months. Use caution when using heavy equipment or power tools. Avoid working on ladders or at heights. Take showers instead of baths. Ensure the water temperature is not too high on the home water heater. Do not go swimming alone. When caring for infants or small children, sit down when holding, feeding, or changing them to minimize risk of injury to the child in the event you have a seizure. Also, Maintain good sleep hygiene. Avoid alcohol.  Electronically signed: Dr. Caryl PinaEric Donyea Gafford

## 2017-03-13 NOTE — Evaluation (Signed)
Physical Therapy Evaluation Patient Details Name: Kathy SpenceChelsie V L Zeien MRN: [id number] DOB: 1990/10/09 Today's Date: 03/13/2017   History of Present Illness  26 y.o. female with a history of bipolar disorder who presents with left-sided weakness and headache.  She went to see a chiropractor last week and following this, she started having a headache.  She then began starting to have left sided numbness and weakness as well as spasms in the arm.   Clinical Impression  Pt is independent with mobility, she ambulated 300' without an assistive device, no loss of balance. She reports some decreased sensation to light touch L hand and foot, mildly decreased strength LUE/LE. She reports 2/10 mid back pain. Encouraged pt to ambulate in halls at least TID to minimize deconditioning. Outpatient PT recommended if back pain persists. She reports back pain started after a friend hugged her and "popped" her back. No further acute PT indicated as pt is independent with mobility, will sign off.     Follow Up Recommendations Outpatient PT(outpatient PT if back pain persists)    Equipment Recommendations  None recommended by PT    Recommendations for Other Services       Precautions / Restrictions Precautions Precautions: None Restrictions Weight Bearing Restrictions: No      Mobility  Bed Mobility Overal bed mobility: Independent                Transfers Overall transfer level: Independent                  Ambulation/Gait Ambulation/Gait assistance: Independent Ambulation Distance (Feet): 300 Feet Assistive device: None Gait Pattern/deviations: WFL(Within Functional Limits)   Gait velocity interpretation: at or above normal speed for age/gender General Gait Details: steady without an assistive device, no loss of balance, 2/10 mid back pain with ambulation (pt reports this started when a friend hugged her tightly and "popped" her back).   Stairs            Wheelchair  Mobility    Modified Rankin (Stroke Patients Only)       Balance Overall balance assessment: Independent                                           Pertinent Vitals/Pain Pain Assessment: 0-10 Pain Score: 2  Pain Location: mid thoracic area Pain Descriptors / Indicators: Spasm Pain Intervention(s): Limited activity within patient's tolerance;Monitored during session;Ice applied    Home Living Family/patient expects to be discharged to:: Private residence Living Arrangements: Children(lives with 3 children ages 556 and under) Available Help at Discharge: Family;Available PRN/intermittently(mother is local) Type of Home: Apartment Home Access: Stairs to enter Entrance Stairs-Rails: Right Entrance Stairs-Number of Steps: flight Home Layout: One level Home Equipment: None      Prior Function Level of Independence: Independent         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     Hand Dominance   Dominant Hand: Right    Extremity/Trunk Assessment   Upper Extremity Assessment Upper Extremity Assessment: LUE deficits/detail LUE Deficits / Details: shoulder elevation AROM 80* limited by pain, grip 4/5, elbow AROM WNL, decreased sensation to light touch L hand LUE: Unable to fully assess due to pain LUE Sensation: decreased light touch    Lower Extremity Assessment Lower Extremity Assessment: LLE deficits/detail LLE Deficits / Details: decreased sensation to light touch L foot, ankle DF -  4/5, knee ext -4/5 LLE Sensation: decreased light touch    Cervical / Trunk Assessment Cervical / Trunk Assessment: Normal  Communication   Communication: No difficulties  Cognition Arousal/Alertness: Awake/alert Behavior During Therapy: WFL for tasks assessed/performed Overall Cognitive Status: Within Functional Limits for tasks assessed                                        General Comments      Exercises     Assessment/Plan     PT Assessment Patent does not need any further PT services  PT Problem List         PT Treatment Interventions      PT Goals (Current goals can be found in the Care Plan section)  Acute Rehab PT Goals Patient Stated Goal: return to work in daycare PT Goal Formulation: All assessment and education complete, DC therapy    Frequency     Barriers to discharge        Co-evaluation               AM-PAC PT "6 Clicks" Daily Activity  Outcome Measure Difficulty turning over in bed (including adjusting bedclothes, sheets and blankets)?: None Difficulty moving from lying on back to sitting on the side of the bed? : None Difficulty sitting down on and standing up from a chair with arms (e.g., wheelchair, bedside commode, etc,.)?: None Help needed moving to and from a bed to chair (including a wheelchair)?: None Help needed walking in hospital room?: None Help needed climbing 3-5 steps with a railing? : None 6 Click Score: 24    End of Session Equipment Utilized During Treatment: Gait belt Activity Tolerance: Patient tolerated treatment well Patient left: in chair;with call bell/phone within reach Nurse Communication: Mobility status      Time: [id number] PT Time Calculation (min) (ACUTE ONLY): 18 min   Charges:   PT Evaluation $PT Eval Low Complexity: 1 Low     PT G Codes:   PT G-Codes **NOT FOR INPATIENT CLASS** Functional Assessment Tool Used: AM-PAC 6 Clicks Basic Mobility Functional Limitation: Mobility: Walking and moving around Mobility: Walking and Moving Around Current Status (V4098(G8978): 0 percent impaired, limited or restricted Mobility: Walking and Moving Around Goal Status (J1914(G8979): 0 percent impaired, limited or restricted      Tamala SerUhlenberg, Chrystian Ressler Kistler 03/13/2017, 11:25 AM [phone]

## 2017-03-14 ENCOUNTER — Emergency Department (HOSPITAL_COMMUNITY)
Admission: EM | Admit: 2017-03-14 | Discharge: 2017-03-15 | Disposition: A | Discharge status: Home / Self Care | Payer: Medicaid Other | Attending: Emergency Medicine | Admitting: Emergency Medicine

## 2017-03-14 ENCOUNTER — Encounter: Payer: Self-pay | Admitting: Neurology

## 2017-03-14 ENCOUNTER — Encounter (HOSPITAL_COMMUNITY): Payer: Self-pay | Admitting: Family Medicine

## 2017-03-14 DIAGNOSIS — M5136 Other intervertebral disc degeneration, lumbar region: Secondary | ICD-10-CM | POA: Insufficient documentation

## 2017-03-14 DIAGNOSIS — J45909 Unspecified asthma, uncomplicated: Secondary | ICD-10-CM | POA: Insufficient documentation

## 2017-03-14 DIAGNOSIS — G40909 Epilepsy, unspecified, not intractable, without status epilepticus: Secondary | ICD-10-CM | POA: Insufficient documentation

## 2017-03-14 DIAGNOSIS — Z91040 Latex allergy status: Secondary | ICD-10-CM | POA: Insufficient documentation

## 2017-03-14 DIAGNOSIS — Z791 Long term (current) use of non-steroidal anti-inflammatories (NSAID): Secondary | ICD-10-CM | POA: Insufficient documentation

## 2017-03-14 DIAGNOSIS — Z79899 Other long term (current) drug therapy: Secondary | ICD-10-CM | POA: Insufficient documentation

## 2017-03-14 DIAGNOSIS — Z87891 Personal history of nicotine dependence: Secondary | ICD-10-CM | POA: Insufficient documentation

## 2017-03-14 DIAGNOSIS — M62838 Other muscle spasm: Secondary | ICD-10-CM | POA: Insufficient documentation

## 2017-03-14 LAB — CBC
HCT: 36.2 % (ref 36.0–46.0)
Hemoglobin: 11.6 g/dL — ABNORMAL LOW (ref 12.0–15.0)
MCH: 27.7 pg (ref 26.0–34.0)
MCHC: 32 g/dL (ref 30.0–36.0)
MCV: 86.4 fL (ref 78.0–100.0)
PLATELETS: 244 10*3/uL (ref 150–400)
RBC: 4.19 MIL/uL (ref 3.87–5.11)
RDW: 12.7 % (ref 11.5–15.5)
WBC: 8.5 10*3/uL (ref 4.0–10.5)

## 2017-03-14 LAB — BASIC METABOLIC PANEL
ANION GAP: 6 (ref 5–15)
BUN: 17 mg/dL (ref 6–20)
CALCIUM: 8.9 mg/dL (ref 8.9–10.3)
CO2: 27 mmol/L (ref 22–32)
Chloride: 107 mmol/L (ref 101–111)
Creatinine, Ser: 0.78 mg/dL (ref 0.44–1.00)
GFR calc Af Amer: >60 mL/min (ref >60)
GLUCOSE: 105 mg/dL — AB (ref 65–99)
Potassium: 4.2 mmol/L (ref 3.5–5.1)
SODIUM: 140 mmol/L (ref 135–145)

## 2017-03-14 LAB — GLUCOSE, CAPILLARY: GLUCOSE-CAPILLARY: 94 mg/dL (ref 65–99)

## 2017-03-14 MED ORDER — LEVETIRACETAM 500 MG PO TABS: 500.0000 mg | ORAL_TABLET | Freq: Two times a day (BID) | ORAL | Status: DC

## 2017-03-14 NOTE — Discharge Instructions (Signed)
Seizure, Adult A seizure is a sudden burst of abnormal electrical activity in the brain. The abnormal activity temporarily interrupts normal brain function, causing a person to experience any of the following:  Involuntary movements.  Changes in awareness or consciousness.  Uncontrollable shaking (convulsions).  Seizures usually last from 30 seconds to 2 minutes. They usually do not cause permanent brain damage unless they are prolonged. What can cause a seizure to happen? Seizures can happen for many reasons including:  A fever.  Low blood sugar.  A medicine.  An illnesses.  A brain injury.  Some people who have a seizure never have another one. People who have repeated seizures have a condition called epilepsy. What are the symptoms of a seizure? Symptoms of a seizure vary greatly from person to person. They include:  Convulsions.  Stiffening of the body.  Involuntary movements of the arms or legs.  Loss of consciousness.  Breathing problems.  Falling suddenly.  Confusion.  Head nodding.  Eye blinking or fluttering.  Lip smacking.  Drooling.  Rapid eye movements.  Grunting.  Loss of bladder control and bowel control.  Staring.  Unresponsiveness.  Some people have symptoms right before a seizure happens (aura) and right after a seizure happens. Symptoms of an aura include:  Fear or anxiety.  Nausea.  Feeling like the room is spinning (vertigo).  A feeling of having seen or heard something before (deja vu).  Odd tastes or smells.  Changes in vision, such as seeing flashing lights or spots.  Symptoms that may follow a seizure include:  Confusion.  Sleepiness.  Headache.  Weakness of one side of the body.  Follow these instructions at home: Medicines   Take over-the-counter and prescription medicines only as told by your health care provider.  Avoid any substances that may prevent your medicine from working properly, such as  alcohol. Activity  Do not drive, swim, or do any other activities that would be dangerous if you had another seizure. Wait until your health care provider approves.  If you live in the [place] check with your local DMV (department of motor vehicles) to find out about the local driving laws. Each state has specific rules about when you can legally return to driving.  Get enough rest. Lack of sleep can make seizures more likely to occur. Educating others Teach friends and family what to do if you have a seizure. They should:  Lay you on the ground to prevent a fall.  Cushion your head and body.  Loosen any tight clothing around your neck.  Turn you on your side. If vomiting occurs, this helps keep your airway clear.  Stay with you until you recover.  Not hold you down. Holding you down will not stop the seizure.  Not put anything in your mouth.  Know whether or not you need emergency care.  General instructions  Contact your health care provider each time you have a seizure.  Avoid anything that has ever triggered a seizure for you.  Keep a seizure diary. Record what you remember about each seizure, especially anything that might have triggered the seizure.  Keep all follow-up visits as told by your health care provider. This is important. Contact a health care provider if:  You have another seizure.  You have seizures more often.  Your seizure symptoms change.  You continue to have seizures with treatment.  You have symptoms of an infection or illness. They might increase your risk of having a seizure. Get  help right away if:  You have a seizure: ? That lasts longer than 5 minutes. ? That is different than previous seizures. ? That leaves you unable to speak or use a part of your body. ? That makes it harder to breathe. ? After a head injury.  You have: ? Multiple seizures in a row. ? Confusion or a severe headache right after a seizure.  You are having  seizures more often.  You do not wake up immediately after a seizure.  You injure yourself during a seizure. These symptoms may represent a serious problem that is an emergency. Do not wait to see if the symptoms will go away. Get medical help right away. Call your local emergency services (911 in the [place] Do not drive yourself to the hospital. This information is not intended to replace advice given to you by your health care provider. Make sure you discuss any questions you have with your health care provider. Document Released: 03/11/2000 Document Revised: 11/08/2015 Document Reviewed: 10/16/2015 Elsevier Interactive Patient Education  2017 [employer] Seizure, Adult When you have a seizure:  Parts of your body may move.  How aware or awake (conscious) you are may change.  You may shake (convulse).  Some people have symptoms right before a seizure happens. These symptoms may include:  Fear.  Worry (anxiety).  Feeling like you are going to throw up (nausea).  Feeling like the room is spinning (vertigo).  Feeling like you saw or heard something before (deja vu).  Odd tastes or smells.  Changes in vision, such as seeing flashing lights or spots.  Seizures usually last from 30 seconds to 2 minutes. Usually, they are not harmful unless they last a long time. Follow these instructions at home: Medicines  Take over-the-counter and prescription medicines only as told by your doctor.  Avoid anything that may keep your medicine from working, such as alcohol. Activity  Do not do any activities that would be dangerous if you had another seizure, like driving or swimming. Wait until your doctor approves.  If you live in the [place] ask your local DMV (department of motor vehicles) when you can drive.  Rest. Teaching others  Teach friends and family what to do when you have a seizure. They should: ? Lay you on the ground. ? Protect your head and body. ? Loosen any tight  clothing around your neck. ? Turn you on your side. ? Stay with you until you are better. ? Not hold you down. ? Not put anything in your mouth. ? Know whether or not you need emergency care. General instructions  Contact your doctor each time you have a seizure.  Avoid anything that gives you seizures.  Keep a seizure diary. Write down: ? What you think caused each seizure. ? What you remember about each seizure.  Keep all follow-up visits as told by your doctor. This is important. Contact a doctor if:  You have another seizure.  You have seizures more often.  There is any change in what happens during your seizures.  You continue to have seizures with treatment.  You have symptoms of being sick or having an infection. Get help right away if:  You have a seizure: ? That lasts longer than 5 minutes. ? That is different than seizures you had before. ? That makes it harder to breathe. ? After you hurt your head.  After a seizure, you cannot speak or use a part of your body.  After a  seizure, you are confused or have a bad headache.  You have two or more seizures in a row.  You are having seizures more often.  You do not wake up right after a seizure.  You get hurt during a seizure. In an emergency:  These symptoms may be an emergency. Do not wait to see if the symptoms will go away. Get medical help right away. Call your local emergency services (911 in the [place] Do not drive yourself to the hospital. This information is not intended to replace advice given to you by your health care provider. Make sure you discuss any questions you have with your health care provider. Document Released: 08/31/2007 Document Revised: 11/25/2015 Document Reviewed: 11/25/2015 Elsevier Interactive Patient Education  2017 [employer]

## 2017-03-14 NOTE — ED Triage Notes (Signed)
Patient is complaining of mid back spasms and headache that has been occurring but getting any better. Patient reports she was diagnosed with seizures and loaded up on Keppra IV. Patient is to follow up with neurology 3-4 weeks. This originally started after a chiropractic visit. Patient has not  mentioned about having a seizure but reports taking her seizure medication. Also, took muscle relaxer's.

## 2017-03-15 ENCOUNTER — Emergency Department (HOSPITAL_COMMUNITY): Payer: Medicaid Other

## 2017-03-15 NOTE — Discharge Instructions (Signed)
Please follow-up with physical therapy and neurology as recommended.  An email has been sent to lumbar neurology to see if they can get you in sooner. Please take all medications as prescribed.

## 2017-03-15 NOTE — ED Provider Notes (Signed)
[facility]-EMERGENCY DEPT Provider Note   CSN: [id number] Arrival date & time: 03/14/17  2006     History   Chief Complaint Chief Complaint  Patient presents with  . Back Pain    HPI Kathy [name] is a 26 y.o. female.  HPI 26 year old female with history of multiple medical comorbidities, recent admission where she was diagnosed with spasms and seizures comes in with chief complaint of back pain. Patient reports that while she was in the hospital her pain was adequately treated and her seizure-like activity was well controlled, however after she was discharged earlier today she has noticed increased pain, and increase episodes of seizure-like activity.  Patient reports that her back pain is located in the mid T-spine and upper lumbar spine region.  Patient has associated burning type pain in that area.  Patient's symptoms started about 2 or 3 weeks ago after she had a strong bearhug from another individual.  Patient reports that immediately after the bear hug she went limp, and soon after she started having pain in the back.  Patient went to chiropractor for the back pain, was noted to have some abnormality of the neck which led to the chiropractor performing cervical manipulations.  Patient had instant pain to the neck, and the next day she started having shaking and ataxia which resulted to the ED visit.  While in the hospital patient had CT angiogram head and neck, MRI brain and MRI C-spine which did not show any acute process.  Patient also had EEG which confirmed epileptic activity.  Patient was started on Keppra and sent home with muscle relaxants and physical therapy.  Patient reports that she had to double up on her muscle relaxants to get some relief.  She is concerned that the seizure-like activity of the upper extremity has gotten worse, and she is not sure how she can function with those. Patient is supposed to see neurology in 3 weeks, and she she  thinks it is too late.   Pt has no urinary incontinence, urinary retention, bowel incontinence, pins and needle sensation in the perineal area.   Past Medical History:  Diagnosis Date  . Asthma   . Bipolar 1 disorder (HCC)   . Cystitis, interstitial   . Depression    better now, situational, had pp with last child  . Dysthymic disorder   . Frequency of urination   . Headache(784.0)   . History of abnormal cervical Pap smear   . History of ovarian cyst   . History of pelvic inflammatory disease   . History of suicide attempt    06/ 2012  overdose oxycontin  . HSV-1 (herpes simplex virus 1) infection   . Nocturia   . Orthostatic hypotension   . Pelvic pain in female   . Vaginal Pap smear, abnormal    ok since    Patient Active Problem List   Diagnosis Date Noted  . Seizures (HCC) 03/13/2017  . Left sided numbness 03/12/2017  . Neck pain 03/12/2017  . Interstitial cystitis 03/12/2017  . UTI (urinary tract infection) 03/12/2017  . Bad headache   . Pregnant 04/16/2015  . Adjustment disorder with depressed mood 10/12/2014  . Anxiety 10/12/2014  . Viral gastroenteritis 10/18/2011    Past Surgical History:  Procedure Laterality Date  . CYSTO WITH HYDRODISTENSION N/A 02/25/2014   Procedure: CYSTOSCOPY/HYDRODISTENSION WITH INSTILLATION;  Surgeon: Martina Sinner, MD;  Location: [facility];  Service: Urology;  Laterality: N/A;  .  DILATION AND CURETTAGE OF UTERUS  2011   W/ SUCTION  . TONSILLECTOMY  09-10-2007    OB History    Gravida Para Term Preterm AB Living   4 3 3  0 1 3   SAB TAB Ectopic Multiple Live Births   1 0 0 0 3       Home Medications    Prior to Admission medications   Medication Sig Start Date End Date Taking? Authorizing Provider  cyclobenzaprine (FLEXERIL) 5 MG tablet Take 5 mg by mouth 3 (three) times daily as needed for muscle spasms.   Yes [provider]  ibuprofen (ADVIL,MOTRIN) 200 MG tablet Take 400 mg by mouth  every 6 (six) hours as needed for moderate pain.   Yes [provider]  levETIRAcetam (KEPPRA) 500 MG tablet Take 1 tablet (500 mg total) by mouth 2 (two) times daily. 03/14/17  Yes Zannie CoveJoseph, Preetha, MD  Multiple Vitamin (MULTIVITAMIN WITH MINERALS) TABS tablet Take 1 tablet by mouth daily.   Yes [provider]  trimethoprim (TRIMPEX) 100 MG tablet Take 100 mg by mouth daily. 02/07/17  Yes [provider]    Family History Family History  Adopted: Yes  Problem Relation Age of Onset  . Breast cancer Maternal Grandmother   . Anesthesia problems Neg Hx     Social History Social History   Tobacco Use  . Smoking status: Former Smoker    Packs/day: 0.25    Years: 0.30    Pack years: 0.07    Types: Cigarettes    Last attempt to quit: 03/28/2014    Years since quitting: 2.9  . Smokeless tobacco: Never Used  Substance Use Topics  . Alcohol use: No    Frequency: Never    Comment: Sober a month ago  . Drug use: No     Allergies   Diflucan [fluconazole] and Latex   Review of Systems Review of Systems  All other systems reviewed and are negative.    Physical Exam Updated Vital Signs BP 109/67 (BP Location: Left Arm)   Pulse 70   Temp 98.1 F (36.7 C) (Oral)   Resp 20   Ht 5\' 5"  (1.651 m)   Wt 84.4 kg (186 lb)   LMP 02/21/2017 (Exact Date) Comment: neg preg test  SpO2 97%   BMI 30.95 kg/m   Physical Exam  Constitutional: She is oriented to person, place, and time. She appears well-developed.  HENT:  Head: Normocephalic and atraumatic.  Eyes: EOM are normal.  Neck: Normal range of motion. Neck supple.  Cardiovascular: Normal rate.  Pulmonary/Chest: Effort normal.  Abdominal: Bowel sounds are normal. There is no tenderness.  Musculoskeletal:  Patient has reproducible tenderness over the thoracic and lumbar spine.  Tenderness is also present over the paraspinal region where spasms are appreciated.  Neurological: She is alert and oriented to  person, place, and time. No cranial nerve deficit. Coordination normal.  Skin: Skin is warm and dry.  Nursing note and vitals reviewed.    ED Treatments / Results  Labs (all labs ordered are listed, but only abnormal results are displayed) Labs Reviewed - No data to display  EKG  EKG Interpretation None       Radiology Dg Thoracic Spine 2 View  Result Date: 03/15/2017 CLINICAL DATA:  Thoracolumbar back pain. Patient reports seizure last week with fall. Pain radiates to both legs. EXAM: THORACIC SPINE 2 VIEWS COMPARISON:  None. FINDINGS: The alignment is maintained. Vertebral body heights are maintained. No significant disc  space narrowing. Posterior elements appear intact. No acute fracture. There is no paravertebral soft tissue abnormality. IMPRESSION: Negative radiographs of the thoracic spine. Electronically Signed   By: Rubye OaksMelanie  Ehinger M.D.   On: 03/15/2017 00:44   Dg Lumbar Spine Complete  Result Date: 03/15/2017 CLINICAL DATA:  Thoracolumbar back pain. Patient reports seizure last week with fall. Pain radiates to both legs. EXAM: LUMBAR SPINE - COMPLETE 4+ VIEW COMPARISON:  None. FINDINGS: The alignment is maintained. Vertebral body heights are normal. There is no listhesis. The posterior elements are intact. Minimal endplate spurring at L3-L4 and trace disc space narrowing. Remaining disc spaces are preserved. No fracture. Sacroiliac joints are symmetric and normal. IMPRESSION: 1. No fracture or acute osseous abnormality. 2. Minimal degenerative disc disease at L3-L4. Electronically Signed   By: Rubye OaksMelanie  Ehinger M.D.   On: 03/15/2017 00:44    Procedures Procedures (including critical care time)  Medications Ordered in ED Medications - No data to display   Initial Impression / Assessment and Plan / ED Course  I have reviewed the triage vital signs and the nursing notes.  Pertinent labs & imaging results that were available during my care of the patient were reviewed by  me and considered in my medical decision making (see chart for details).     Patient comes in with chief complaint of back pain, and persistent seizure-like activity.  Patient was just discharged from the hospital this morning.  Workup in the hospital, including imaging and consultation by neurology reviewed.  Patient reports that her shaking has gotten worse since she left.  I informed patient that it is possible that her seizure is now well controlled and that she might have to go up on her Keppra.  Patient however reported that she would prefer not adjusting her medications at this time, therefore I did not call neurology at the request of the patient.  Patient does want to see a neurologist sooner than 3 weeks, and I will email  neurology to see if they can help  Patient is also having back pain.  On my exam it appears that the paraspinal tenderness is worse in the spinal region.  Also patient has palpable spasms.  No red flags on history suggestive of cord compression.  X-ray of the spine have been ordered.  I have stressed the importance for prompt and close physical therapy follow-up along with other adjuvant treatments that will help patient with the spasms.  It appears at this time that patient is still not at her steady state, however she is going to require optimal follow-up with neurology and physical therapy along with compliance to recommendations from the services to get better.  And it might take several weeks before patient is back at functional level.  For her to be patient. Strict ER return precautions have been discussed, and patient is agreeing with the plan and is comfortable with the workup done and the recommendations from the ER.   Final Clinical Impressions(s) / ED Diagnoses   Final diagnoses:  Degenerative disc disease, lumbar  Seizure disorder [facility](HCC)    ED Discharge Orders    None       Derwood KaplanNanavati, Wiliam Cauthorn, MD 03/15/17 0100

## 2017-03-15 NOTE — Discharge Summary (Signed)
Physician Discharge Summary  Kathy [name] WNU:[id number] DOB: November 17, 1990 DOA: 03/11/2017  PCP: [place] CampbellKaplan, Kristen W., PA-C  Admit date: 03/11/2017 Discharge date: 03/14/2017  Time spent: 35 minutes  Recommendations for Outpatient Follow-up:  1. PCP in 1 week 2. [facility] in 18month   Discharge Diagnoses:  Principal Problem:   Seizures (HCC) Active Problems:   Adjustment disorder with depressed mood   Anxiety   Left sided numbness   Neck pain   Interstitial cystitis   UTI (urinary tract infection)   Bad headache   Discharge Condition: improved  Diet recommendation: regular  Filed Weights   03/12/17 0248  Weight: 84.6 kg (186 lb 9.6 oz)    History of present illness:  26 year old female history of asthma, depression, anxiety, bipolar disorder, adjustment disorder presented with neck pain, left-sided numbness and weakness and muscle spasms/jerking.  Patient had her neck recently adjusted by chiropractor and started having headache and left-sided numbness left-sided weakness and muscle spasm/jerking in the left upper extremity with difficulty ambulating.     Hospital Course:  #1.Primary generalized epilepsy/new diagnosis -Patient presented with some left-sided weakness and numbness as well as some left-sided jerking with a headache  - CT head, MRI head, MRI C-spine are unremarkable.  - Neurology was consulted, initially complex migraine was suspected - EEG was done on 03/13/2017 which was consistent with a diagnosis of primary generalized epilepsy per neurology.  -Neurology loaded her with IV Keppra and placed on maintenance dose of Keppra 500mg  BID -Pt was advised and she understood that [place] statutes, patients with seizures are not allowed to drive until they have been seizure-free for six months. Advised to use caution when using heavy equipment or power tools. Avoid working on ladders or at heights. Take showers instead of baths. Ensure  the water temperature is not too high on the home water heater. Do not go swimming alone. When caring for infants or small children, sit down when holding, feeding, or changing them to minimize risk of injury to the child in the event you have a seizure. Also, advised to maintain good sleep hygiene and avoid alcohol. -Seizure precautions were discussed with patient. -Needs outpatient follow-up with neurology in 3-4 weeks, referral sent to [facility]  2.  Adjustment disorder with depressed mood Stable.  3.  Anxiety Remained stable.    Consultations:  Neurology  Discharge Exam: Vitals:   03/13/17 2139 03/14/17 0536  BP: (!) 109/59 (!) 100/46  Pulse: 63 (!) 59  Resp: 16 16  Temp: 97.7 F (36.5 C) 97.9 F (36.6 C)  SpO2: 98% 96%    General: AAOx3 Cardiovascular: S1S2/RRR Respiratory: CTAB  Discharge Instructions   Discharge Instructions    Ambulatory referral to Neurology   Complete by:  As directed    An appointment is requested in approximately: 4weeks   Ambulatory referral to Occupational Therapy   Complete by:  As directed    Ambulatory referral to Physical Therapy   Complete by:  As directed    Diet general   Complete by:  As directed    Increase activity slowly   Complete by:  As directed      Allergies as of 03/14/2017      Reactions   Diflucan [fluconazole] Itching, Rash   Latex Itching, Rash      Medication List    TAKE these medications   levETIRAcetam 500 MG tablet Commonly known as:  KEPPRA Take 1 tablet (500 mg total) by mouth 2 (  two) times daily.   multivitamin with minerals Tabs tablet Take 1 tablet by mouth daily.   trimethoprim 100 MG tablet Commonly known as:  TRIMPEX Take 100 mg by mouth daily.      Allergies  Allergen Reactions  . Diflucan [Fluconazole] Itching and Rash  . Latex Itching and Rash   Follow-up Information    [facility] Follow up.   Why:  [phone] [address]  [place], [place], [place] [address]       [facility]. Schedule an appointment as soon as possible for a visit in 1 month(s).   Why:  f/u in 1-2 weeks. Contact information: [address] [place], [address] [place] [address] [phone]       [facility]. Schedule an appointment as soon as possible for a visit in 1 week(s).   Specialty:  Family Medicine Why:  f/u in 1-2 weeks. Contact information: [address] [place] [address] [phone]            The results of significant diagnostics from this hospitalization (including imaging, microbiology, ancillary and laboratory) are listed below for reference.    Significant Diagnostic Studies: Ct Angio Head W Or Wo Contrast  Result Date: 03/11/2017 CLINICAL DATA:  Recent spinal manipulation. Neck pain. Left-sided neurological deficits. Concern for arterial dissection. EXAM: CT ANGIOGRAPHY HEAD AND NECK TECHNIQUE: Multidetector CT imaging of the head and neck was performed using the standard protocol during bolus administration of intravenous contrast. Multiplanar CT image reconstructions and MIPs were obtained to evaluate the vascular anatomy. Carotid stenosis measurements (when applicable) are obtained utilizing NASCET criteria, using the distal internal carotid diameter as the denominator. CONTRAST:  ISOVUE-370 IOPAMIDOL (ISOVUE-370) INJECTION 76% COMPARISON:  Head CT 01/24/2012 FINDINGS: CT HEAD FINDINGS Brain: There is no evidence of acute infarct, intracranial hemorrhage, mass, midline shift, or extra-axial fluid collection. The ventricles and sulci are normal. Vascular: No hyperdense vessel. Skull: No fracture or focal osseous lesion. Sinuses: Visualized paranasal sinuses and mastoid air cells are clear. Orbits: Unremarkable. Review of the MIP images confirms the above findings CTA NECK FINDINGS Aortic arch: Normal variant aortic arch branching pattern with common origin  of the brachiocephalic and left common carotid arteries. Widely patent brachiocephalic and subclavian arteries. Right carotid system: Patent without evidence of stenosis, dissection, or significant atherosclerosis. Left carotid system: Patent without evidence of stenosis, dissection, or significant atherosclerosis. Vertebral arteries: Patent without evidence of stenosis, dissection, or significant atherosclerosis. Mildly to moderately dominant left vertebral artery. Skeleton: No acute osseous abnormality or suspicious osseous lesion. No evidence of significant cervical spondylosis. Other neck: No mass or lymph node enlargement. Upper chest: Clear lung apices. Review of the MIP images confirms the above findings CTA HEAD FINDINGS Anterior circulation: The internal carotid arteries are widely patent from skullbase to carotid termini. ACAs and MCAs are patent without evidence of proximal branch occlusion or significant stenosis. The right A1 segment is markedly hypoplastic. No aneurysm. Posterior circulation: The intracranial vertebral arteries are patent to the basilar. A patent right PICA is identified. A left PICA is not clearly identified. AICAs and SCAs are small and not well evaluated. Basilar artery is widely patent. There is a small right posterior communicating artery. PCAs are patent without evidence of significant stenosis. No aneurysm. Venous sinuses: Patent. Anatomic variants: Hypoplastic right A1. Delayed phase: No abnormal enhancement. Review of the MIP images confirms the above findings IMPRESSION: Negative head and neck CTA.  No evidence  of dissection. Electronically Signed   By: Sebastian AcheAllen  Grady M.D.   On: 03/11/2017 20:15   Dg Thoracic Spine 2 View  Result Date: 03/15/2017 CLINICAL DATA:  Thoracolumbar back pain. Patient reports seizure last week with fall. Pain radiates to both legs. EXAM: THORACIC SPINE 2 VIEWS COMPARISON:  None. FINDINGS: The alignment is maintained. Vertebral body heights are  maintained. No significant disc space narrowing. Posterior elements appear intact. No acute fracture. There is no paravertebral soft tissue abnormality. IMPRESSION: Negative radiographs of the thoracic spine. Electronically Signed   By: Rubye OaksMelanie  Ehinger M.D.   On: 03/15/2017 00:44   Dg Lumbar Spine Complete  Result Date: 03/15/2017 CLINICAL DATA:  Thoracolumbar back pain. Patient reports seizure last week with fall. Pain radiates to both legs. EXAM: LUMBAR SPINE - COMPLETE 4+ VIEW COMPARISON:  None. FINDINGS: The alignment is maintained. Vertebral body heights are normal. There is no listhesis. The posterior elements are intact. Minimal endplate spurring at L3-L4 and trace disc space narrowing. Remaining disc spaces are preserved. No fracture. Sacroiliac joints are symmetric and normal. IMPRESSION: 1. No fracture or acute osseous abnormality. 2. Minimal degenerative disc disease at L3-L4. Electronically Signed   By: Rubye OaksMelanie  Ehinger M.D.   On: 03/15/2017 00:44   Ct Angio Neck W And/or Wo Contrast  Result Date: 03/11/2017 CLINICAL DATA:  Recent spinal manipulation. Neck pain. Left-sided neurological deficits. Concern for arterial dissection. EXAM: CT ANGIOGRAPHY HEAD AND NECK TECHNIQUE: Multidetector CT imaging of the head and neck was performed using the standard protocol during bolus administration of intravenous contrast. Multiplanar CT image reconstructions and MIPs were obtained to evaluate the vascular anatomy. Carotid stenosis measurements (when applicable) are obtained utilizing NASCET criteria, using the distal internal carotid diameter as the denominator. CONTRAST:  100mL ISOVUE-370 IOPAMIDOL (ISOVUE-370) INJECTION 76% COMPARISON:  Head CT 01/24/2012 FINDINGS: CT HEAD FINDINGS Brain: There is no evidence of acute infarct, intracranial hemorrhage, mass, midline shift, or extra-axial fluid collection. The ventricles and sulci are normal. Vascular: No hyperdense vessel. Skull: No fracture or focal  osseous lesion. Sinuses: Visualized paranasal sinuses and mastoid air cells are clear. Orbits: Unremarkable. Review of the MIP images confirms the above findings CTA NECK FINDINGS Aortic arch: Normal variant aortic arch branching pattern with common origin of the brachiocephalic and left common carotid arteries. Widely patent brachiocephalic and subclavian arteries. Right carotid system: Patent without evidence of stenosis, dissection, or significant atherosclerosis. Left carotid system: Patent without evidence of stenosis, dissection, or significant atherosclerosis. Vertebral arteries: Patent without evidence of stenosis, dissection, or significant atherosclerosis. Mildly to moderately dominant left vertebral artery. Skeleton: No acute osseous abnormality or suspicious osseous lesion. No evidence of significant cervical spondylosis. Other neck: No mass or lymph node enlargement. Upper chest: Clear lung apices. Review of the MIP images confirms the above findings CTA HEAD FINDINGS Anterior circulation: The internal carotid arteries are widely patent from skullbase to carotid termini. ACAs and MCAs are patent without evidence of proximal branch occlusion or significant stenosis. The right A1 segment is markedly hypoplastic. No aneurysm. Posterior circulation: The intracranial vertebral arteries are patent to the basilar. A patent right PICA is identified. A left PICA is not clearly identified. AICAs and SCAs are small and not well evaluated. Basilar artery is widely patent. There is a small right posterior communicating artery. PCAs are patent without evidence of significant stenosis. No aneurysm. Venous sinuses: Patent. Anatomic variants: Hypoplastic right A1. Delayed phase: No abnormal enhancement. Review of the MIP images confirms the above findings IMPRESSION: Negative  head and neck CTA.  No evidence of dissection. Electronically Signed   By: Sebastian Ache M.D.   On: 03/11/2017 20:15   Mr Brain Wo  Contrast  Result Date: 03/12/2017 CLINICAL DATA:  26 year old female with acute onset left side numbness in weakness yesterday while shopping. Persistent symptoms. Neck pain. EXAM: MRI HEAD WITHOUT CONTRAST TECHNIQUE: Multiplanar, multiecho pulse sequences of the brain and surrounding structures were obtained without intravenous contrast. COMPARISON:  CTA head and neck 03/11/2017. Head CT without contrast 01/24/2012. FINDINGS: Brain: No restricted diffusion to suggest acute infarction. No midline shift, mass effect, evidence of mass lesion, ventriculomegaly, extra-axial collection or acute intracranial hemorrhage. Cervicomedullary junction and pituitary are within normal limits. Wallace Cullens and white matter signal is within normal limits throughout the brain. No encephalomalacia or chronic cerebral blood products identified. Deep gray matter nuclei, brainstem, and cerebellum appear normal. Vascular: Major intracranial vascular flow voids are preserved. Skull and upper cervical spine: Negative visualized cervical spine. Capacious visible cervical spinal canal. Normal bone marrow signal. Sinuses/Orbits: Normal orbits soft tissues. Paranasal sinuses and mastoids are stable and well pneumatized. Other: Visible internal auditory structures appear normal. Visible scalp and face soft tissues appear negative. IMPRESSION: Normal MRI appearance of the brain. Electronically Signed   By: Odessa Fleming M.D.   On: 03/12/2017 10:43   Mr Cervical Spine Wo Contrast  Result Date: 03/12/2017 CLINICAL DATA:  Left-sided numbness and weakness. EXAM: MRI CERVICAL SPINE WITHOUT CONTRAST TECHNIQUE: Multiplanar, multisequence MR imaging of the cervical spine was performed. No intravenous contrast was administered. COMPARISON:  None. FINDINGS: Alignment: Normal Vertebrae: Normal marrow signal except for a small C6 hemangioma. No fracture or worrisome bone lesion. Cord: Normal signal intensity.  No cord lesion or syrinx. Posterior Fossa, vertebral  arteries, paraspinal tissues: No significant findings. Disc levels: No disc protrusions, spinal or foraminal stenosis. IMPRESSION: Unremarkable cervical spine MRI examination. Electronically Signed   By: Rudie Meyer M.D.   On: 03/12/2017 11:02    Microbiology: Recent Results (from the past 240 hour(s))  Urine Culture     Status: Abnormal   Collection Time: 03/12/17  6:44 AM  Result Value Ref Range Status   Specimen Description URINE, RANDOM  Final   Special Requests NONE  Final   Culture (A)  Final    <10,000 COLONIES/mL INSIGNIFICANT GROWTH Performed at [facility], [address], [place], [place] [address]    Report Status 03/13/2017 FINAL  Final  Culture, blood (Routine X 2) w Reflex to ID Panel     Status: None (Preliminary result)   Collection Time: 03/12/17  7:03 AM  Result Value Ref Range Status   Specimen Description BLOOD RIGHT HAND  Final   Special Requests   Final    BOTTLES DRAWN AEROBIC AND ANAEROBIC Blood Culture adequate volume   Culture   Final    NO GROWTH 3 DAYS Performed at [facility], [address] [place], [place] [address]    Report Status PENDING  Incomplete  Culture, blood (Routine X 2) w Reflex to ID Panel     Status: None (Preliminary result)   Collection Time: 03/12/17  7:12 AM  Result Value Ref Range Status   Specimen Description BLOOD LEFT HAND  Final   Special Requests   Final    BOTTLES DRAWN AEROBIC AND ANAEROBIC Blood Culture adequate volume   Culture   Final    NO GROWTH 3 DAYS Performed at [facility] [place] [facility], [address], [place],  [place] [address]    Report Status PENDING  Incomplete     Labs: Basic Metabolic Panel: Recent Labs  Lab 03/11/17 1748 03/12/17 0536 03/13/17 0544 03/14/17 0446  NA 142 137 140 140  K 4.4 3.4* 3.8 4.2  CL 107 103 105 107  CO2 27 27 24 27   GLUCOSE 97 93 105* 105*  BUN 15 14 19 17   CREATININE 1.02* 0.83 0.77 0.78  CALCIUM 9.7 9.1 9.2 8.9  MG  --   --  1.9  --    Liver Function  Tests: No results for input(s): AST, ALT, ALKPHOS, BILITOT, PROT, ALBUMIN in the last 168 hours. No results for input(s): LIPASE, AMYLASE in the last 168 hours. No results for input(s): AMMONIA in the last 168 hours. CBC: Recent Labs  Lab 03/11/17 1748 03/12/17 0536 03/13/17 0544 03/14/17 0446  WBC 10.5 8.8 7.3 8.5  NEUTROABS 7.5  --  4.0  --   HGB 12.8 12.0 12.4 11.6*  HCT 39.0 36.9 39.0 36.2  MCV 84.4 85.8 86.9 86.4  PLT 284 256 246 244   Cardiac Enzymes: No results for input(s): CKTOTAL, CKMB, CKMBINDEX, TROPONINI in the last 168 hours. BNP: BNP (last 3 results) No results for input(s): BNP in the last 8760 hours.  ProBNP (last 3 results) No results for input(s): PROBNP in the last 8760 hours.  CBG: Recent Labs  Lab 03/12/17 0731 03/13/17 0804 03/14/17 0734  GLUCAP 82 96 94       Signed:  Zannie Cove MD.  [facility] 03/15/2017, 3:55 PM

## 2017-03-17 LAB — CULTURE, BLOOD (ROUTINE X 2)
CULTURE: NO GROWTH
Culture: NO GROWTH
SPECIAL REQUESTS: ADEQUATE
Special Requests: ADEQUATE

## 2017-03-30 ENCOUNTER — Ambulatory Visit: Payer: Medicaid Other | Attending: Internal Medicine | Admitting: Physical Therapy

## 2017-03-30 DIAGNOSIS — M546 Pain in thoracic spine: Secondary | ICD-10-CM | POA: Diagnosis not present

## 2017-03-30 DIAGNOSIS — M6281 Muscle weakness (generalized): Secondary | ICD-10-CM | POA: Insufficient documentation

## 2017-03-30 DIAGNOSIS — R293 Abnormal posture: Secondary | ICD-10-CM | POA: Diagnosis present

## 2017-03-30 NOTE — Patient Instructions (Signed)
  Head Press With Chin Tuck    Tuck chin SLIGHTLY toward chest, keep mouth closed. Feel weight on back of head. Increase weight by pressing head down. Hold __5_ seconds. Relax. Repeat __10_ times. Surface: floor   Copyright  VHI. All rights reserved.    Over Head Pull: Narrow Grip       On back, knees bent, feet flat, band across thighs, elbows straight but relaxed. Pull hands apart (start). Keeping elbows straight, bring arms up and over head, hands toward floor. Keep pull steady on band. Hold momentarily. Return slowly, keeping pull steady, back to start. Repeat __10_ times. Band color ____Red/Green__   Side Pull: Double Arm   On back, knees bent, feet flat. Arms perpendicular to body, shoulder level, elbows straight but relaxed. Pull arms out to sides, elbows straight. Resistance band comes across collarbones, hands toward floor. Hold momentarily. Slowly return to starting position. Repeat _10-20__ times. Band color ___R/G __   Sash   On back, knees bent, feet flat, left hand on left hip, right hand above left. Pull right arm DIAGONALLY (hip to shoulder) across chest. Bring right arm along head toward floor. Hold momentarily. Slowly return to starting position. Repeat __10-20_ times. Do with left arm. Band color ____R/G__   Shoulder Rotation: Double Arm   On back, knees bent, feet flat, elbows tucked at sides, bent 90, hands palms up. Pull hands apart and down toward floor, keeping elbows near sides. Hold momentarily. Slowly return to starting position. Repeat _10-20__ times. Band color ___R/G___

## 2017-03-30 NOTE — Therapy (Addendum)
[place], [place], [address] Phone: [phone]   Fax:  [phone]  Physical Therapy Evaluation/Discharge  Patient Details  Name: Kathy [name] MRN: [id number] Date of Birth: 1990/05/03 Referring Provider: Dr. Domenic Polite   Encounter Date: 03/30/2017  PT End of Session - 03/30/17 1432    Visit Number  1    Number of Visits  12    Date for PT Re-Evaluation  05/25/17    Authorization Type  MCD     Authorization - Visit Number  1    PT Start Time  1100    PT Stop Time  1156    PT Time Calculation (min)  56 min    Activity Tolerance  Patient tolerated treatment well    Behavior During Therapy  [facility] for tasks assessed/performed       Past Medical History:  Diagnosis Date  . Asthma   . Bipolar 1 disorder ([place])   . Cystitis, interstitial   . Depression    better now, situational, had pp with last child  . Dysthymic disorder   . Frequency of urination   . Headache(784.0)   . History of abnormal cervical Pap smear   . History of ovarian cyst   . History of pelvic inflammatory disease   . History of suicide attempt    06/ 2012  overdose oxycontin  . HSV-1 (herpes simplex virus 1) infection   . Nocturia   . Orthostatic hypotension   . Pelvic pain in female   . Vaginal Pap smear, abnormal    ok since    Past Surgical History:  Procedure Laterality Date  . CYSTO WITH HYDRODISTENSION N/A 02/25/2014   Procedure: CYSTOSCOPY/HYDRODISTENSION WITH INSTILLATION;  Surgeon: Reece Packer, MD;  Location: [facility];  Service: Urology;  Laterality: N/A;  . DILATION AND CURETTAGE OF UTERUS  2011   W/ SUCTION  . TONSILLECTOMY  09-10-2007    There were no vitals filed for this visit.   Subjective Assessment - 03/30/17 1108    Subjective  Pt was bear-hugged from behind early Dec and felt a pop in her back, reports "going limp" and recovered.   She developed pain 2 days after  that,  went to chiropractor.  She ended up going to the ED with what she describes as a panic anxiety attack, L sided weakness.  She developed repetitive involunary muscular contractions while there.  Seizure workup was inconclusive although she did have an abnormal EEG.   She cont to have pain in the center of her mid back but mild and intermittent.  She follows up with Neurologist soon.  She complains of general fatigue but not worse than prior.  She has tingling in fingers and toes which are her normal.  She is uncomfortable when she is laying down on her back . SHe hopes to return to work but cannot do so until she is cleared by PT and Neuro.     Pertinent History  bipolar, interstitial cystitis, OH    Limitations  Other (comment) has to have OK to work.    Diagnostic tests  EEG, XR and MRI of cervical spine, thoracic spine and brain (MRI) 03/11/17.     Patient Stated Goals  To be able to go back to work and avoid this happening again.     Currently in Pain?  No/denies    Pain Score  -- can be moderate 5/10     Pain  Location  Back    Pain Descriptors / Indicators  Discomfort    Pain Type  Acute pain    Pain Frequency  Several days a week         [facility] PT Assessment - 03/30/17 0001      Assessment   Medical Diagnosis  neck pain     Referring Provider  Dr. Domenic Polite    Onset Date/Surgical Date  03/03/17    Hand Dominance  Right    Next MD Visit  Neuro within 1-2 weeks     Prior Therapy  No       Precautions   Precautions  None      Restrictions   Weight Bearing Restrictions  No      Balance Screen   Has the patient fallen in the past 6 months  No      [place] residence      Prior Function   Vocation  Full time employment    Vocation Requirements  infant daycare, lifting , holding     Leisure  has 3 kids under age 56      Cognition   Overall Cognitive Status  Within Functional Limits for tasks assessed      Observation/Other  Assessments   Focus on Therapeutic Outcomes (FOTO)   NT due to MCD       Sensation   Light Touch  Appears Intact      Posture/Postural Control   Posture/Postural Control  Postural limitations    Postural Limitations  Rounded Shoulders;Forward head;Decreased lumbar lordosis      AROM   Cervical Flexion  50    Cervical Extension  60    Cervical - Right Side Bend  40    Cervical - Left Side Bend  60    Cervical - Right Rotation  75    Cervical - Left Rotation  75 feels tighter     Lumbar Flexion  WFL    Lumbar Extension  WFL    Lumbar - Right Side Bend  WNL     Lumbar - Left Side Bend  min tight no pain     Lumbar - Right Rotation  WNL     Lumbar - Left Rotation  min pain       PROM   Overall PROM Comments  shoulders hypermobile PROM       Strength   Right Shoulder Flexion  4+/5    Right Shoulder ABduction  4+/5    Right Shoulder Internal Rotation  4+/5    Right Shoulder External Rotation  4+/5    Right Shoulder Horizontal ABduction  3+/5    Left Shoulder Flexion  4/5    Left Shoulder ABduction  4/5    Left Shoulder Internal Rotation  4+/5    Left Shoulder External Rotation  4+/5    Left Shoulder Horizontal ABduction  4/5    Right Elbow Flexion  4+/5    Right Elbow Extension  4+/5    Left Elbow Flexion  4+/5    Left Elbow Extension  4+/5      Palpation   Spinal mobility  mild hypermobile in cervical spine, WNL in thoracic spine , mid thoracic rotated to Rt.     Palpation comment  TTP from T5 up to T2.  General guarded posture in uppoer back and cervicals.        Special Tests    Special Tests  Cervical  Cervical Tests  Spurling's      Spurling's   Findings  Negative    Comment  bilateral              Objective measurements completed on examination: See above findings.      [place] Adult PT Treatment/Exercise - 03/30/17 0001      Neck Exercises: Supine   Neck Retraction  10 reps;5 secs    Other Supine Exercise  supine scapular stabilization red band  x 10: narrow grip, horizontal pull, ER/IR and sash       Moist Heat Therapy   Number Minutes Moist Heat  10 Minutes    Moist Heat Location  Shoulder;Cervical mid back              PT Education - 03/30/17 1430    Education provided  Yes    Education Details  PT/POC, stabilization , eval findings, HEP     Person(s) Educated  Patient    Methods  Explanation;Demonstration;Verbal cues;Handout    Comprehension  Verbalized understanding;Returned demonstration          PT Long Term Goals - 03/30/17 1450      PT LONG TERM GOAL #1   Title  Pt will be I with HEP for cervical and thoracic spine , posture     Baseline  unknown, given on eval.     Time  6    Period  Weeks    Status  New    Target Date  05/11/17      PT LONG TERM GOAL #2   Title  Pt will be able to lift, carry items <15 lbs without exacerbating pain.     Baseline  unable to do, needs for work duties     Time  6    Period  Weeks    Status  New    Target Date  05/11/17      PT LONG TERM GOAL #3   Title  Pt will be able to sleep comfortably with less use of prescription meds.     Baseline  takes meds, mod difficulty sleeping and getting comfortable.     Time  6    Period  Weeks    Status  New    Target Date  05/11/17      PT LONG TERM GOAL #4   Title  Pt will have symmetrical cervical spine rotation and sidebending without pain.     Baseline  pain and tightness to L rotation and sidebending     Time  6    Period  Weeks    Status  New    Target Date  05/11/17             Plan - 03/30/17 1437    Clinical Impression Statement  Pt presents for mod complexity eval for min thoracic spine pain s/p spinal manipulation.  She takes her prescribed muscel relaxers as soon as she feels pain or discomfort.  She is anxious about it happening again. She demonstrates min postural weakness, symmetrical strength and normal spinal mobility.  She has some mild rotation to Rt. of spinal segments. No red flags during  today's eval.  She exhibits mild hypermobility in her shoulder and cervical spine.  She will hopefully returning to work but would benefit from postural training and stabilization to prevent further impairments.     History and Personal Factors relevant to plan of care:  migraines, anxiety, OH     Clinical Presentation  Evolving    Clinical Presentation due to:  incidence of clonic seizure (not confirmed)    Clinical Decision Making  Moderate    Rehab Potential  Excellent    PT Frequency  2x / week 1-2 times per week     PT Duration  8 weeks    PT Treatment/Interventions  Taping;Therapeutic exercise;Therapeutic activities;Cryotherapy;Manual techniques;Patient/family education;Neuromuscular re-education;ADLs/Self Care Home Management;Moist Heat;Ultrasound    PT Next Visit Plan  check HEP, try seated thoracic ext, gentle mobs for rotation ? heat , UBE    PT Home Exercise Plan  supine scapular stability , chin tuck     Consulted and Agree with Plan of Care  Patient       Patient will benefit from skilled therapeutic intervention in order to improve the following deficits and impairments:  Increased muscle spasms, Improper body mechanics, Hypermobility, Decreased strength, Increased fascial restricitons, Postural dysfunction, Pain  Visit Diagnosis: Pain in thoracic spine  Muscle weakness (generalized)  Abnormal posture     Problem List Patient Active Problem List   Diagnosis Date Noted  . Seizures ([place]) 03/13/2017  . Left sided numbness 03/12/2017  . Neck pain 03/12/2017  . Interstitial cystitis 03/12/2017  . UTI (urinary tract infection) 03/12/2017  . Bad headache   . Pregnant 04/16/2015  . Adjustment disorder with depressed mood 10/12/2014  . Anxiety 10/12/2014  . Viral gastroenteritis 10/18/2011    Dovber Ernest 03/30/2017, 2:56 PM  [facility] [address] [place], [place], [address] Phone: [phone]   Fax:   [phone]  Name: Kathy [name] MRN: [id number] Date of Birth: May 21, 1990  Raeford Razor, PT 03/30/17 2:56 PM Phone: [phone] Fax: [phone]   PHYSICAL THERAPY DISCHARGE SUMMARY  Visits from Start of Care: 1  Current functional level related to goals / functional outcomes: See above, unknown    Remaining deficits: Unknown, did not return   Education / Equipment: None  Plan: Patient agrees to discharge.  Patient goals were not met. Patient is being discharged due to not returning since the last visit.  ?????    Raeford Razor, PT 05/04/17 12:57 PM Phone: [phone] Fax: [phone]

## 2017-04-06 ENCOUNTER — Ambulatory Visit: Payer: Medicaid Other | Admitting: Neurology

## 2017-04-06 ENCOUNTER — Encounter: Payer: Self-pay | Admitting: Neurology

## 2017-04-06 VITALS — BP 112/68 | HR 85 | Wt 192.0 lb

## 2017-04-06 DIAGNOSIS — R569 Unspecified convulsions: Secondary | ICD-10-CM | POA: Diagnosis not present

## 2017-04-06 NOTE — Patient Instructions (Signed)
1. Schedule 1-hour sleep-deprived EEG 2. Start weaning off Keppra 250mg  once a day for a week, then one every other day for a week, then stop 3. For any change in symptoms, call our office for any problems 4. Follow-up in 4-5 months

## 2017-04-06 NOTE — Progress Notes (Signed)
NEUROLOGY CONSULTATION NOTE  Kathy [name] MRN: [id number] DOB: 03-03-1991  Referring provider: Dr. Derwood Kaplan Primary care provider: Mady Gemma, PA-C  Reason for consult:  seizure  Dear Dr Rhunette Croft:  Thank you for your kind referral of Kathy [name] for consultation of the above symptoms. Although her history is well known to you, please allow me to reiterate it for the purpose of our medical record. The patient was accompanied to the clinic by her mother who also provides collateral information. Records and images were personally reviewed where available.   HISTORY OF PRESENT ILLNESS: This is a 27 year old right-handed woman with a history of bipolar disorder, depression, anxiety, ADHD, presenting after hospital admission last 03/11/2017. She recalls having back issues in the beginning of December and saw a chiropractor for adjustment. The next day, she felt lethargic and did not eat much. She recalls feeling anxious, then went to [place] for Christmas shopping with her children. She started to have what she considers as a panic attack, she felt her left ear starting to go numb and she felt herself listing to her left side and feel dizzy. She started getting more anxious and "freaking out" with palpitations, drove to her mother's house 2 miles away. Her mother called EMS, and when they arrived she was in the middle of a panic attack and reports her muscles on the left arm started to spasm. She shows a video on her phone that she took herself, she reports she was awake and alert, and could feel the muscles tightening at her upper back then the left arm jerking would start. She reports being given a muscle relaxant in the hospital, and the jerking stopped. She was evaluated by Neurology during her admission, she reported left-sided weakness and occipital headaches, as well as arm spasms. she had a normal MRI brain and cervical spine without contrast which I personally  reviewed. Due to the left arm spasms, she had an EEG done, which was abnormal showing occasional 2-4 second bursts of generalized high voltage 4 Hz spike and polyspike and wave discharges without clinical correlate. She was started on Keppra the day prior to hospital discharge, and reports the muscle relaxant was stopped. She came back to the ER that evening because she noticed increased pain and left arm spasms. She was given Flexeril and states this helped again with the spasms. She saw PT once since hospital discharge due to left-sided weakness, and feels her strength is better. She states that she has not had any further jerks/spasms since she went back to the ER and restarted the Flexeril. She stopped having the back pain, no further headaches, and stopped the muscle relaxant after a week, with no recurrence of symptoms. She self-tapered Keppra to 250mg  BID after 3 days because it made her very moody and mean. She started feeling a little anxious again the other day when she felt overstimulated at a children's party, but was able to calm herself down with no progression of symptoms. She has a diagnosis of ADHD and anxiety but does not want to take any medications.   She and her mother deny any staring/unresponsive episodes, gaps in time, olfactory/gustatory hallucinations, focal numbness/tingling/weakness, early morning myoclonus. She denies any further headaches, dizziness, diplopia, dysarthria/dysphagia, bowel dysfunction. She has interstitial cystitis. She was previously seen by one of my partners, Dr. Everlena Cooper in 2015 when she had left facial numbness, and was diagnosed with migraines. She has not had similar symptoms since. She  had vertigo one time related to ear issues. She states she no longer has any neck or back pain.   Epilepsy Risk Factors:  She had a normal birth and early development.  There is no history of febrile convulsions, CNS infections such as meningitis/encephalitis, significant traumatic  brain injury, neurosurgical procedures, or family history of seizures.  PAST MEDICAL HISTORY: Past Medical History:  Diagnosis Date  . Asthma   . Bipolar 1 disorder ([facility])   . Cystitis, interstitial   . Depression    better now, situational, had pp with last child  . Dysthymic disorder   . Frequency of urination   . Headache(784.0)   . History of abnormal cervical Pap smear   . History of ovarian cyst   . History of pelvic inflammatory disease   . History of suicide attempt    06/ 2012  overdose oxycontin  . HSV-1 (herpes simplex virus 1) infection   . Nocturia   . Orthostatic hypotension   . Pelvic pain in female   . Vaginal Pap smear, abnormal    ok since    PAST SURGICAL HISTORY: Past Surgical History:  Procedure Laterality Date  . CYSTO WITH HYDRODISTENSION N/A 02/25/2014   Procedure: CYSTOSCOPY/HYDRODISTENSION WITH INSTILLATION;  Surgeon: Martina Sinner, MD;  Location: [facility];  Service: Urology;  Laterality: N/A;  . DILATION AND CURETTAGE OF UTERUS  2011   W/ SUCTION  . TONSILLECTOMY  09-10-2007    MEDICATIONS: Current Outpatient Medications on File Prior to Visit  Medication Sig Dispense Refill  . amoxicillin-clavulanate (AUGMENTIN) 875-125 MG tablet Take by mouth.    . cyclobenzaprine (FLEXERIL) 5 MG tablet Take 5 mg by mouth 3 (three) times daily as needed for muscle spasms.    Marland Kitchen ibuprofen (ADVIL,MOTRIN) 200 MG tablet Take 400 mg by mouth every 6 (six) hours as needed for moderate pain.    Marland Kitchen levETIRAcetam (KEPPRA) 500 MG tablet Take 1 tablet (500 mg total) by mouth 2 (two) times daily. 60 tablet 01  . Multiple Vitamin (MULTIVITAMIN WITH MINERALS) TABS tablet Take 1 tablet by mouth daily.    Marland Kitchen trimethoprim (TRIMPEX) 100 MG tablet Take 100 mg by mouth daily.  11   No current facility-administered medications on file prior to visit.     ALLERGIES: Allergies  Allergen Reactions  . Diflucan [Fluconazole] Itching and Rash  . Latex Itching  and Rash    FAMILY HISTORY: Family History  Adopted: Yes  Problem Relation Age of Onset  . Breast cancer Maternal Grandmother   . Anesthesia problems Neg Hx     SOCIAL HISTORY: Social History   Socioeconomic History  . Marital status: Single    Spouse name: Not on file  . Number of children: Not on file  . Years of education: Not on file  . Highest education level: Not on file  Social Needs  . Financial resource strain: Not on file  . Food insecurity - worry: Not on file  . Food insecurity - inability: Not on file  . Transportation needs - medical: Not on file  . Transportation needs - non-medical: Not on file  Occupational History  . Not on file  Tobacco Use  . Smoking status: Former Smoker    Packs/day: 0.25    Years: 0.30    Pack years: 0.07    Types: Cigarettes    Last attempt to quit: 03/28/2014    Years since quitting: 3.0  . Smokeless tobacco: Never Used  Substance and Sexual  Activity  . Alcohol use: No    Frequency: Never    Comment: Sober a month ago  . Drug use: No  . Sexual activity: Yes    Birth control/protection: None  Other Topics Concern  . Not on file  Social History Narrative  . Not on file    REVIEW OF SYSTEMS: Constitutional: No fevers, chills, or sweats, no generalized fatigue, change in appetite Eyes: No visual changes, double vision, eye pain Ear, nose and throat: No hearing loss, ear pain, nasal congestion, sore throat Cardiovascular: No chest pain, palpitations Respiratory:  No shortness of breath at rest or with exertion, wheezes GastrointestinaI: No nausea, vomiting, diarrhea, abdominal pain, fecal incontinence Genitourinary:  No dysuria, urinary retention or frequency Musculoskeletal:  No neck pain, back pain Integumentary: No rash, pruritus, skin lesions Neurological: as above Psychiatric: No depression, insomnia, anxiety Endocrine: No palpitations, fatigue, diaphoresis, mood swings, change in appetite, change in weight,  increased thirst Hematologic/Lymphatic:  No anemia, purpura, petechiae. Allergic/Immunologic: no itchy/runny eyes, nasal congestion, recent allergic reactions, rashes  PHYSICAL EXAM: Vitals:   04/06/17 1057  BP: 112/68  Pulse: 85  SpO2: 99%   General: No acute distress Head:  Normocephalic/atraumatic Eyes: Fundoscopic exam shows bilateral sharp discs, no vessel changes, exudates, or hemorrhages Neck: supple, no paraspinal tenderness, full range of motion Back: No paraspinal tenderness Heart: regular rate and rhythm Lungs: Clear to auscultation bilaterally. Vascular: No carotid bruits. Skin/Extremities: No rash, no edema Neurological Exam: Mental status: alert and oriented to person, place, and time, no dysarthria or aphasia, Fund of knowledge is appropriate.  Recent and remote memory are intact. 3/3 delayed recall.  Attention and concentration are normal.    Able to name objects and repeat phrases. Cranial nerves: CN I: not tested CN II: pupils equal, round and reactive to light, visual fields intact, fundi unremarkable. CN III, IV, VI:  full range of motion, no nystagmus, no ptosis CN V: facial sensation intact CN VII: upper and lower face symmetric CN VIII: hearing intact to finger rub CN IX, X: gag intact, uvula midline CN XI: sternocleidomastoid and trapezius muscles intact CN XII: tongue midline Bulk & Tone: normal, no fasciculations. Motor: 5/5 throughout with no pronator drift. Sensation: intact to light touch, cold, pin, vibration and joint position sense.  No extinction to double simultaneous stimulation.  Romberg test negative Deep Tendon Reflexes: +2 throughout, no ankle clonus Plantar responses: downgoing bilaterally Cerebellar: no incoordination on finger to nose, heel to shin. No dysdiadochokinesia Gait: narrow-based and steady, able to tandem walk adequately. Tremor: none  IMPRESSION: This is a 27 year old right-handed woman with a history of ADHD, anxiety,  bipolar disorder, depression, presenting for abnormal EEG last month, done in the setting of left arm spasms/jerking. She and her mother deny any seizure-like symptoms previously, she reports the jerking started with upper back pain and tightening in her upper back, followed by left arm spasms. The video she shows is concerning for myoclonic jerking, particularly in setting of the abnormal EEG with generalized spike and polyspike and wave discharges. She reports she continued to have the symptoms on Keppra, but the spasms resolved after restarting Flexeril. She is not having any further neck/back pain, and has not been taking Flexeril, with no jerking/spasms since 03/14/17. She self-reduced Keppra to 250mg  BID. We discussed how EEGs can be abnormal if there is a family history of seizures, and patients can be asymptomatic from this. Recommendation is to repeat a 1-hour sleep-deprived EEG. She will start  weaning off Keppra (she is on a very low dose), and knows to call our office for any recurrence or change in symptoms. No work or driving restrictions, she did not have any alteration of awareness per reports. She will follow-up in 4-5 months and knows to call for any changes.   Thank you for allowing me to participate in the care of this patient. Please do not hesitate to call for any questions or concerns.   Patrcia DollyKaren Meir Elwood, M.D.  CC: Mady GemmaKristen Kaplan, PA-C, Dr. Rhunette CroftNanavati

## 2017-04-07 ENCOUNTER — Telehealth: Payer: Self-pay | Admitting: Neurology

## 2017-04-07 NOTE — Telephone Encounter (Signed)
Pt called and wants to know when she can come by today to pick up her work note, please call and advise

## 2017-04-07 NOTE — Telephone Encounter (Signed)
Returned call.  No answer.  LMOM letting pt know that I faxed her work note to her place of employment earlier this morning.  I also relayed that I left a copy of work note at our front desk for her to pick up at her convenience.

## 2017-04-12 ENCOUNTER — Encounter: Payer: Medicaid Other | Admitting: Physical Therapy

## 2017-04-24 ENCOUNTER — Ambulatory Visit (INDEPENDENT_AMBULATORY_CARE_PROVIDER_SITE_OTHER): Payer: Medicaid Other | Admitting: Neurology

## 2017-04-24 DIAGNOSIS — R569 Unspecified convulsions: Secondary | ICD-10-CM

## 2017-04-27 ENCOUNTER — Telehealth: Payer: Self-pay | Admitting: Neurology

## 2017-04-27 MED ORDER — LEVETIRACETAM 250 MG PO TABS: ORAL_TABLET | ORAL | 3 refills | Status: DC

## 2017-04-27 NOTE — Procedures (Signed)
ELECTROENCEPHALOGRAM REPORT  Date of Study: 04/24/2017  Patient's Name: Kathy [name] MRN: [id number] Date of Birth: 1990/09/30  Referring Provider: Dr. Patrcia DollyKaren Dent Plantz  Clinical History: This is a 27 year old woman with an episode of neck spasms with left arm jerking. Prior EEG abnormal.  Medications: AUGMENTIN 875-125 MG tablet FLEXERIL 5 MG tablet ADVIL,MOTRIN 200 MG tablet MULTIVITAMIN WITH MINERALS TABS TRIMPEX 100 MG tablet  Technical Summary: A multichannel digital 1-hour sleep-deprived EEG recording measured by the international 10-20 system with electrodes applied with paste and impedances below 5000 ohms performed in our laboratory with EKG monitoring in an awake and asleep patient.  Hyperventilation and photic stimulation were performed.  The digital EEG was referentially recorded, reformatted, and digitally filtered in a variety of bipolar and referential montages for optimal display.    Description: The patient is awake and asleep during the recording.  During maximal wakefulness, there is a symmetric, medium voltage 9 Hz posterior dominant rhythm that attenuates with eye opening.  The record is symmetric.  During drowsiness and sleep, there is an increase in theta slowing of the background.  Vertex waves and symmetric sleep spindles were seen.  There were frequent generalized high voltage 4-5 Hz spike and polyspike and wave discharges with frontal predominance seen lasting 1-3 seconds both in wakefulness and sleep. These did not significantly increase with hyperventilation and photic stimulation. There were no electrographic seizures seen.    EKG lead was unremarkable.  Impression: This 1-hour awake and asleep EEG is abnormal due to frequent generalized high voltage 4-5 Hz spike and polyspike and wave discharges.  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 No electrographic seizures seen.    Patrcia DollyKaren Eyal Greenhaw, M.D.

## 2017-04-27 NOTE — Telephone Encounter (Signed)
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 She did notice that when she had to stay up late, her right side started spasming for an hour. She took Keppra $RemoveBefor eDEID_[id number]$250mgg dizzy. She takes it every other day. Discussed that at this point, would recommend staying on seizure medication. If symptoms are controlled on low dose Keppra, she can stay on it, but if she either has the myoclonic jerks or side effects, then we can switch to a different AED. She expressed understanding and knows to call for any changes. Rx for Keppra 250mg  qhs was sent in.

## 2017-05-31 ENCOUNTER — Ambulatory Visit: Payer: Medicaid Other | Admitting: Neurology

## 2017-06-23 ENCOUNTER — Telehealth: Payer: Self-pay | Admitting: Neurology

## 2017-06-23 NOTE — Telephone Encounter (Signed)
Pt called and states that she thinks she is having an reaction to her Keppra.  States that a week prior to her menses she starts experiencing migraines with aura, forgetfulness (will forget words in mid-sentence), and space out.  States that these symptoms get progressively worse until her menses ends. She also states that she has not experienced a seizure since starting Keppra, however she is fearful she will have one every time these symptoms occur. Please advise.

## 2017-06-27 MED ORDER — LEVETIRACETAM 250 MG PO TABS: ORAL_TABLET | ORAL | 3 refills | Status: DC

## 2017-06-27 NOTE — Telephone Encounter (Signed)
Spoke to patient, pretty consistent on the week before she gets her period, she gets forgetful, bad migraines, making anxiety 10x worse but no jerking. She is taking low dose Keppra 250mg  qAM with no side effect or myoclonic jerks otherwise. She is worried it is due to Keppra since she was not having this previously, however I discussed concern that these may be related to catamenial seizures (or migraines) and would actually increase Keppra dose. We discussed increasing Keppra dose, she is asking if we could only do it around the time of her period, which is reasonable if she is pretty regular with her menstrual cycle. She will try taking 2 tabs for the 5-7 days prior to her period. Refills sent. We discussed that if symptoms continue, we can switch to a different AED.

## 2017-09-08 ENCOUNTER — Encounter (HOSPITAL_COMMUNITY): Payer: Self-pay

## 2017-09-08 ENCOUNTER — Emergency Department (HOSPITAL_COMMUNITY): Payer: Medicaid Other

## 2017-09-08 ENCOUNTER — Emergency Department (HOSPITAL_COMMUNITY)
Admission: EM | Admit: 2017-09-08 | Discharge: 2017-09-08 | Disposition: A | Discharge status: Home / Self Care | Payer: Medicaid Other | Attending: Emergency Medicine | Admitting: Emergency Medicine

## 2017-09-08 ENCOUNTER — Other Ambulatory Visit: Payer: Self-pay

## 2017-09-08 DIAGNOSIS — Z79899 Other long term (current) drug therapy: Secondary | ICD-10-CM | POA: Insufficient documentation

## 2017-09-08 DIAGNOSIS — R1011 Right upper quadrant pain: Secondary | ICD-10-CM | POA: Insufficient documentation

## 2017-09-08 DIAGNOSIS — J45909 Unspecified asthma, uncomplicated: Secondary | ICD-10-CM | POA: Insufficient documentation

## 2017-09-08 DIAGNOSIS — Z91040 Latex allergy status: Secondary | ICD-10-CM | POA: Insufficient documentation

## 2017-09-08 DIAGNOSIS — Z87891 Personal history of nicotine dependence: Secondary | ICD-10-CM | POA: Insufficient documentation

## 2017-09-08 HISTORY — DX: Unspecified convulsions: R56.9

## 2017-09-08 LAB — CBC WITH DIFFERENTIAL/PLATELET
BASOS ABS: 0 10*3/uL (ref 0.0–0.1)
BASOS PCT: 0 %
EOS PCT: 2 %
Eosinophils Absolute: 0.2 10*3/uL (ref 0.0–0.7)
HEMATOCRIT: 43.6 % (ref 36.0–46.0)
Hemoglobin: 14.2 g/dL (ref 12.0–15.0)
Lymphocytes Relative: 33 %
Lymphs Abs: 2.9 10*3/uL (ref 0.7–4.0)
MCH: 28.6 pg (ref 26.0–34.0)
MCHC: 32.6 g/dL (ref 30.0–36.0)
MCV: 87.9 fL (ref 78.0–100.0)
MONO ABS: 0.7 10*3/uL (ref 0.1–1.0)
Monocytes Relative: 7 %
NEUTROS ABS: 5.2 10*3/uL (ref 1.7–7.7)
Neutrophils Relative %: 58 %
PLATELETS: 270 10*3/uL (ref 150–400)
RBC: 4.96 MIL/uL (ref 3.87–5.11)
RDW: 12.6 % (ref 11.5–15.5)
WBC: 9 10*3/uL (ref 4.0–10.5)

## 2017-09-08 LAB — COMPREHENSIVE METABOLIC PANEL
ALBUMIN: 4.3 g/dL (ref 3.5–5.0)
ALT: 20 U/L (ref 14–54)
AST: 21 U/L (ref 15–41)
Alkaline Phosphatase: 43 U/L (ref 38–126)
Anion gap: 8 (ref 5–15)
BUN: 14 mg/dL (ref 6–20)
CHLORIDE: 107 mmol/L (ref 101–111)
CO2: 27 mmol/L (ref 22–32)
Calcium: 9.6 mg/dL (ref 8.9–10.3)
Creatinine, Ser: 0.85 mg/dL (ref 0.44–1.00)
GFR calc Af Amer: >60 mL/min (ref >60)
GFR calc non Af Amer: >60 mL/min (ref >60)
GLUCOSE: 80 mg/dL (ref 65–99)
POTASSIUM: 4.1 mmol/L (ref 3.5–5.1)
Sodium: 142 mmol/L (ref 135–145)
Total Bilirubin: 0.9 mg/dL (ref 0.3–1.2)
Total Protein: 7.5 g/dL (ref 6.5–8.1)

## 2017-09-08 LAB — URINALYSIS, ROUTINE W REFLEX MICROSCOPIC
Bilirubin Urine: NEGATIVE
Glucose, UA: NEGATIVE mg/dL
Hgb urine dipstick: NEGATIVE
Ketones, ur: NEGATIVE mg/dL
Leukocytes, UA: NEGATIVE
NITRITE: NEGATIVE
PROTEIN: NEGATIVE mg/dL
Specific Gravity, Urine: 1.005 (ref 1.005–1.030)
pH: 6 (ref 5.0–8.0)

## 2017-09-08 LAB — LIPASE, BLOOD: Lipase: 33 U/L (ref 11–51)

## 2017-09-08 LAB — POC URINE PREG, ED: PREG TEST UR: NEGATIVE

## 2017-09-08 MED ORDER — HYDROCODONE-ACETAMINOPHEN 5-325 MG PO TABS: 1.0000 | ORAL_TABLET | ORAL | 0 refills | Status: DC | PRN

## 2017-09-08 MED ORDER — ONDANSETRON HCL 4 MG PO TABS: 4.0000 mg | ORAL_TABLET | Freq: Four times a day (QID) | ORAL | 0 refills | Status: DC

## 2017-09-08 NOTE — ED Triage Notes (Addendum)
Patient c/o right flank pain and urinary frequncy x 2 days. Patient reports a history of Interstitial cystitis.

## 2017-09-08 NOTE — Discharge Instructions (Addendum)
Blood work, urinalysis, pregnancy test, ultrasound all negative.  Prescription for pain and nausea medicine.  Follow-up with your primary care doctor.

## 2017-09-08 NOTE — ED Notes (Signed)
Bed: WA01 Expected date:  Expected time:  Means of arrival:  Comments: 

## 2017-09-09 NOTE — ED Provider Notes (Signed)
[facility]-EMERGENCY DEPT Provider Note   CSN: [id number] Arrival date & time: 09/08/17  4098     History   Chief Complaint Chief Complaint  Patient presents with  . Flank Pain  . Urinary Frequency    HPI Kathy [name] is a 27 y.o. female.  Right upper quadrant abdominal pain with radiation to the right back for 2 days with associated burping.  She has been eating normally.  Normal bowel movements.  No fever, sweats, chills, dysuria, hematuria, chest pain, dyspnea.  Severity symptoms is mild.  Nothing makes symptoms better or worse.  Past medical history includes interstitial cystitis and seizure disorder.     Past Medical History:  Diagnosis Date  . Asthma   . Bipolar 1 disorder (HCC)   . Cystitis, interstitial   . Depression    better now, situational, had pp with last child  . Dysthymic disorder   . Frequency of urination   . Headache(784.0)   . History of abnormal cervical Pap smear   . History of ovarian cyst   . History of pelvic inflammatory disease   . History of suicide attempt    06/ 2012  overdose oxycontin  . HSV-1 (herpes simplex virus 1) infection   . Nocturia   . Orthostatic hypotension   . Pelvic pain in female   . Seizures (HCC)   . Vaginal Pap smear, abnormal    ok since    Patient Active Problem List   Diagnosis Date Noted  . Seizures (HCC) 03/13/2017  . Left sided numbness 03/12/2017  . Neck pain 03/12/2017  . Interstitial cystitis 03/12/2017  . UTI (urinary tract infection) 03/12/2017  . Bad headache   . Pregnant 04/16/2015  . Adjustment disorder with depressed mood 10/12/2014  . Anxiety 10/12/2014  . Viral gastroenteritis 10/18/2011    Past Surgical History:  Procedure Laterality Date  . CYSTO WITH HYDRODISTENSION N/A 02/25/2014   Procedure: CYSTOSCOPY/HYDRODISTENSION WITH INSTILLATION;  Surgeon: Martina Sinner, MD;  Location: [facility];  Service: Urology;  Laterality: N/A;  .  DILATION AND CURETTAGE OF UTERUS  2011   W/ SUCTION  . TONSILLECTOMY  09-10-2007     OB History    Gravida  4   Para  3   Term  3   Preterm  0   AB  1   Living  3     SAB  1   TAB  0   Ectopic  0   Multiple  0   Live Births  3            Home Medications    Prior to Admission medications   Medication Sig Start Date End Date Taking? Authorizing Provider  Fexofenadine HCl (ALLEGRA ALLERGY PO) Take 1 tablet by mouth daily as needed (allergies).   Yes [provider]  ibuprofen (ADVIL,MOTRIN) 200 MG tablet Take 400 mg by mouth every 6 (six) hours as needed for moderate pain.   Yes [provider]  levETIRAcetam (KEPPRA) 250 MG tablet Take 1 tablet daily. On week prior to menstrual period, take 2 tablets daily. Patient taking differently: Take 250-500 mg by mouth as directed. Take 1 tablet (250 mg) by mouth daily. On week prior to menstrual period, Take 2 tablets (500 mg) by mouth daily. 06/27/17  Yes Van Clines, MD  Multiple Vitamin (MULTIVITAMIN WITH MINERALS) TABS tablet Take 1 tablet by mouth daily.   Yes [provider]  Omega-3 Fatty Acids (  FISH OIL) 1000 MG CAPS Take 1 capsule by mouth daily.   Yes [provider]  trimethoprim (TRIMPEX) 100 MG tablet Take 100 mg by mouth daily. 02/07/17  Yes [provider]  chlorhexidine (PERIDEX) 0.12 % solution Use as directed 15 mLs in the mouth or throat 2 (two) times daily as needed (mouth pain).  06/23/17   [provider]  cyclobenzaprine (FLEXERIL) 5 MG tablet Take 5 mg by mouth 3 (three) times daily as needed for muscle spasms.    [provider]  HYDROcodone-acetaminophen (NORCO/VICODIN) 5-325 MG tablet Take 1 tablet by mouth every 4 (four) hours as needed. 09/08/17   Donnetta Hutchingook, Monzerat Handler, MD  ondansetron (ZOFRAN) 4 MG tablet Take 1 tablet (4 mg total) by mouth every 6 (six) hours. 09/08/17   Donnetta Hutchingook, Marionette Meskill, MD    Family History Family History  Adopted: Yes    Problem Relation Age of Onset  . Breast cancer Maternal Grandmother   . Anesthesia problems Neg Hx     Social History Social History   Tobacco Use  . Smoking status: Former Smoker    Packs/day: 0.25    Years: 0.30    Pack years: 0.07    Types: Cigarettes    Last attempt to quit: 03/28/2014    Years since quitting: 3.4  . Smokeless tobacco: Never Used  Substance Use Topics  . Alcohol use: No    Frequency: Never    Comment: Sober a month ago  . Drug use: No     Allergies   Diflucan [fluconazole] and Latex   Review of Systems Review of Systems  All other systems reviewed and are negative.    Physical Exam Updated Vital Signs BP 108/64 (BP Location: Right Arm)   Pulse (!) 57   Temp 97.9 F (36.6 C) (Oral)   Resp 16   Ht 5\' 5"  (1.651 m)   Wt 81.6 kg (180 lb)   LMP 08/25/2017   SpO2 100%   BMI 29.95 kg/m   Physical Exam  Constitutional: She is oriented to person, place, and time.  nad  HENT:  Head: Normocephalic and atraumatic.  Eyes: Conjunctivae are normal.  Neck: Neck supple.  Cardiovascular: Normal rate and regular rhythm.  Pulmonary/Chest: Effort normal and breath sounds normal.  Abdominal: Soft. Bowel sounds are normal.  Minimal tenderness right upper quadrant.  Musculoskeletal: Normal range of motion.  Neurological: She is alert and oriented to person, place, and time.  Skin: Skin is warm and dry.  Psychiatric: She has a normal mood and affect. Her behavior is normal.  Nursing note and vitals reviewed.    ED Treatments / Results  Labs (all labs ordered are listed, but only abnormal results are displayed) Labs Reviewed  URINALYSIS, ROUTINE W REFLEX MICROSCOPIC - Abnormal; Notable for the following components:      Result Value   Color, Urine STRAW (*)    All other components within normal limits  CBC WITH DIFFERENTIAL/PLATELET  COMPREHENSIVE METABOLIC PANEL  LIPASE, BLOOD  POC URINE PREG, ED    EKG None  Radiology [place]as Abdomen  Limited  Result Date: 09/08/2017 CLINICAL DATA:  Right upper quadrant pain for 2 days. EXAM: ULTRASOUND ABDOMEN LIMITED RIGHT UPPER QUADRANT COMPARISON:  Right upper quadrant ultrasound 05/16/2016. CT abdomen and pelvis 11/03/2016. FINDINGS: Gallbladder: No gallstones or wall thickening visualized. No sonographic Murphy sign noted by sonographer. Common bile duct: Diameter: 0.2 cm. Liver: No focal lesion identified. Within normal limits in parenchymal echogenicity. Portal vein is patent on  color Doppler imaging with normal direction of blood flow towards the liver. IMPRESSION: Normal exam.  Negative for gallstones. Electronically Signed   By: Drusilla Kanner M.D.   On: 09/08/2017 13:17    Procedures Procedures (including critical care time)  Medications Ordered in ED Medications - No data to display   Initial Impression / Assessment and Plan / ED Course  I have reviewed the triage vital signs and the nursing notes.  Pertinent labs & imaging results that were available during my care of the patient were reviewed by me and considered in my medical decision making (see chart for details).     Patient presents with right upper quadrant pain.  No acute abdomen on physical exam.  Screening labs, gallbladder ultrasound all negative.  Patient is stable for outpatient follow-up.  Final Clinical Impressions(s) / ED Diagnoses   Final diagnoses:  RUQ pain    ED Discharge Orders        Ordered    HYDROcodone-acetaminophen (NORCO/VICODIN) 5-325 MG tablet  Every 4 hours PRN     09/08/17 1519    ondansetron (ZOFRAN) 4 MG tablet  Every 6 hours     09/08/17 1519       Donnetta Hutching, MD 09/09/17 [phone]

## 2017-09-18 ENCOUNTER — Ambulatory Visit: Payer: Medicaid Other | Admitting: Neurology

## 2017-12-06 ENCOUNTER — Telehealth: Payer: Self-pay | Admitting: Neurology

## 2017-12-06 NOTE — Telephone Encounter (Signed)
LMOM for pt letting her know that for medication management, she will need to see Dr. Karel Jarvis every 6-12 months. Asked that she call office and schedule appointment.  Will send Rx until appointment at which time new Rx will be sent.

## 2017-12-06 NOTE — Telephone Encounter (Signed)
Patient called and wanted to know if she needed to schedule an appointment in order to get her Keppra medication refilled. She has a 2 month refill supply left currently. Please call her back at [phone] and leave message if you don't get her. Thanks!

## 2018-01-03 ENCOUNTER — Other Ambulatory Visit: Payer: Self-pay

## 2018-01-03 ENCOUNTER — Telehealth: Payer: Self-pay | Admitting: Neurology

## 2018-01-03 MED ORDER — LEVETIRACETAM 250 MG PO TABS: ORAL_TABLET | ORAL | 3 refills | Status: DC

## 2018-01-03 NOTE — Telephone Encounter (Signed)
Patient forgot to mention this morning that she is not having seizures anymore but is having small muscle twitches in her arms and legs. Patient wants to know if this could be from the medication and if she should be worried about this.

## 2018-01-03 NOTE — Telephone Encounter (Signed)
LMOM relaying message below.  Asked pt to increase to 2 tabs daily and call back with update in a couple of days.

## 2018-01-03 NOTE — Telephone Encounter (Signed)
Patient needing a refill of medication keppra sent to [facility]. Pt has follow up on 5.4.2020

## 2018-01-03 NOTE — Telephone Encounter (Signed)
Rx sent to [facility] on [address]

## 2018-01-03 NOTE — Telephone Encounter (Signed)
Pls let her know that this is not due to the medication, and that the medication can potentially help with it. She is on a very low dose. She was only taking 1 tablet daily, and takes 2 tabs daily during her period. I would recommend taking 2 tabs daily on a regular basis and see if this helps with the small muscle twitches. Thanks

## 2018-01-03 NOTE — Telephone Encounter (Signed)
pls advise before I return call.  

## 2018-01-15 ENCOUNTER — Telehealth: Payer: Self-pay | Admitting: Neurology

## 2018-01-15 DIAGNOSIS — R51 Headache: Secondary | ICD-10-CM

## 2018-01-15 DIAGNOSIS — R569 Unspecified convulsions: Secondary | ICD-10-CM

## 2018-01-15 DIAGNOSIS — F4321 Adjustment disorder with depressed mood: Secondary | ICD-10-CM

## 2018-01-15 DIAGNOSIS — R519 Headache, unspecified: Secondary | ICD-10-CM

## 2018-01-15 NOTE — Telephone Encounter (Signed)
Spoke with pt.  She states that after increasing Keppra to 2 Tabs she still experienced slight body twitches.  Pt states that added more bananas to her diet, thinking it may be low potassium, and her body twitches did stop.  Pt goes on to state that with the fall/colder months coming she is noticing more aura's as though she is about to have a seizure, but does not have a seizure.  Asked if she should continue with the increased dose.  I advised that yes, she should.  Pt also asked if she can adjust the time that she takes her Keppra.  States that she is "suppose" to take it in the AM, however she takes if around noon most days.  I advised that that is OK.  That once she has a good time for her to take it, to continue to take around the same time each day.    Dr.  Karel Jarvis - FYI, also forwarding in case you have any more advise.

## 2018-01-15 NOTE — Telephone Encounter (Signed)
Agree with medication instructions. One thing we can do is another EEG to see what the brain waves look like now that she is taking the Keppra. If still pretty active, we may actually have to increase dose more. Thanks

## 2018-01-15 NOTE — Telephone Encounter (Signed)
Patient called and has questions about her medication and her seizure activity

## 2018-01-16 NOTE — Telephone Encounter (Signed)
Spoke with pt.  Advised that Dr. Karel Jarvis would like her to repeat EEG since she is taking Keppra now.  Pt agreeable to EEG.  Pt was at work in a very loud environment (pt works in a daycare center) and unable to really talk.  Let her know that our office will be in touch with her in the next few days to schedule EEG.    Dr. Karel Jarvis - what type of EEG would you like pt to have?

## 2018-01-16 NOTE — Telephone Encounter (Signed)
Let's do a 1-hour EEG (not sleep deprived), thanks

## 2018-01-17 NOTE — Telephone Encounter (Signed)
[facility] relaying appointment information below.

## 2018-01-17 NOTE — Telephone Encounter (Signed)
I have her on for 01-24-18 at 2:30  Thank you

## 2018-01-17 NOTE — Telephone Encounter (Signed)
Order for routine EEG placed in system   [place], Kathy [name] or [place] - can one of you please schedule and let me know.  I will contact pt with appt information.

## 2018-01-24 ENCOUNTER — Other Ambulatory Visit: Payer: Medicaid Other

## 2018-01-29 ENCOUNTER — Other Ambulatory Visit: Payer: Medicaid Other

## 2018-02-15 ENCOUNTER — Other Ambulatory Visit: Payer: Medicaid Other

## 2018-03-13 ENCOUNTER — Telehealth: Payer: Self-pay | Admitting: Neurology

## 2018-03-13 NOTE — Telephone Encounter (Signed)
Patient had seizure activity and had to come home from work early. She has some questions. Please call her back [phone]. Thanks!

## 2018-03-14 NOTE — Telephone Encounter (Signed)
Would advise she starts taking the Keppra 2 tablets daily, either she takes it BID or takes both tablets every night if it makes her drowsy. Thanks

## 2018-03-14 NOTE — Telephone Encounter (Signed)
FAX # [phone]

## 2018-03-14 NOTE — Telephone Encounter (Signed)
Patient calling back. Please Call. Thanks

## 2018-03-14 NOTE — Telephone Encounter (Signed)
Spoke with pt.  She states that yesterday while at work she started having frequent urination and slight muscle twitching.  States that this is usually followed by seizure activity and therefor she took a second Keppra 250mg  tab.  States that about 20 or 30 minutes later the room started spinning.  Pt became dizzy, perspired excessively, and muscle twitches intensified.  States that she was able to relay to her manager that she was having seizure activity and that she needed to go home for the day.  States that she is unsure how she sounded while talking to Production designer, theatre/television/filmmanager, but manager was able to grasp what pt was saying.  Pt goes to states that after returning home she became overly emotional, could not stop crying and her anxiety was heightened.  Pt states that she plans to call out of work today due to not being back to "herself".  I advised that I would write note/letter excusing her from work today for recovery.  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  However she does not have the energy to maintain at work today.    Pt has EEG scheduled to Jan 7 and f/u scheduled for May 2020.  Verbal per Dr. Karel JarvisAquino - schedule f/u for Jan 9 @ 3:30PM.  Keep May f/u appt scheduled for now.   Dr. Karel JarvisAquino - Any further advise before returning call?

## 2018-03-14 NOTE — Telephone Encounter (Signed)
Spoke with pt relaying medication message below.  Pt expressed that she does not wish to increase Keppra and that she already takes second tab PRN with no issues "if caught in time."  But agreed to increase for time being.  Letter written and faxed to pt work

## 2018-04-02 ENCOUNTER — Ambulatory Visit (INDEPENDENT_AMBULATORY_CARE_PROVIDER_SITE_OTHER): Payer: Medicaid Other | Admitting: Neurology

## 2018-04-02 ENCOUNTER — Encounter: Payer: Self-pay | Admitting: Neurology

## 2018-04-02 DIAGNOSIS — F4321 Adjustment disorder with depressed mood: Secondary | ICD-10-CM | POA: Diagnosis not present

## 2018-04-02 DIAGNOSIS — R51 Headache: Secondary | ICD-10-CM | POA: Diagnosis not present

## 2018-04-02 DIAGNOSIS — R569 Unspecified convulsions: Secondary | ICD-10-CM

## 2018-04-02 DIAGNOSIS — R519 Headache, unspecified: Secondary | ICD-10-CM

## 2018-04-03 NOTE — Procedures (Signed)
ELECTROENCEPHALOGRAM REPORT  Date of Study: 04/02/2018  Patient's Name: Kathy [name] MRN: [id number] Date of Birth: [birth year]  Referring Provider: Dr. Patrcia Dolly  Clinical History: This is a 28 year old woman with primary generalized epilepsy with continued symptoms on Keppra.  Medications: Keppra  Technical Summary: A multichannel digital 1-hour EEG recording measured by the international 10-20 system with electrodes applied with paste and impedances below 5000 ohms performed in our laboratory with EKG monitoring in an awake and asleep patient.  Hyperventilation and photic stimulation were performed.  The digital EEG was referentially recorded, reformatted, and digitally filtered in a variety of bipolar and referential montages for optimal display.    Description: The patient is awake and asleep during the recording.  During maximal wakefulness, there is a symmetric, medium voltage 9.5 Hz posterior dominant rhythm that attenuates with eye opening.  The record is symmetric. There were occasional bursts of medium voltage 4-5 Hz theta activity seen during wakefulness. During drowsiness and sleep, there is an increase in theta slowing of the background.  Vertex waves and symmetric sleep spindles were seen. There were frequent bursts of regular generalized high voltage 4 Hz spike and wave discharges with frontal predominance seen lasting 2 to 5 seconds without clinical correlate. At times, right-sided lead-in or fragments of spike discharges are seen over the frontocentral regions. During hyperventilation and photic stimulation, similar bursts were seen without increase in frequency noted. There were no electrographic seizures seen.    EKG lead was unremarkable.  Impression: This 1-hour awake and asleep EEG is abnormal due to frequent generalized high voltage 4 Hz spike and wave discharges lasting 2-5 seconds.  Clinical Correlation of the above findings is consistent with a diagnosis  of primary generalized epilepsy. Patient did not report any symptoms correlating with generalized discharges lasting longer than 3 seconds. If further clinical questions remain, prolonged EEG may be helpful.  Clinical correlation is advised.   Patrcia Dolly, M.D.

## 2018-05-25 ENCOUNTER — Telehealth: Payer: Self-pay | Admitting: Neurology

## 2018-05-25 NOTE — Telephone Encounter (Signed)
Patient is wanting recent EEG results.

## 2018-05-29 NOTE — Telephone Encounter (Signed)
Returned call to pt.  No answer.  LMOM with EEG results as well as Dr. Rosalyn Gess recommendations.  Asked for return call.

## 2018-07-27 ENCOUNTER — Telehealth: Payer: Self-pay

## 2018-07-27 ENCOUNTER — Telehealth (INDEPENDENT_AMBULATORY_CARE_PROVIDER_SITE_OTHER): Payer: Medicaid Other | Admitting: Neurology

## 2018-07-27 ENCOUNTER — Other Ambulatory Visit: Payer: Self-pay

## 2018-07-27 VITALS — Ht 65.0 in | Wt 180.0 lb

## 2018-07-27 DIAGNOSIS — G40B09 Juvenile myoclonic epilepsy, not intractable, without status epilepticus: Secondary | ICD-10-CM

## 2018-07-27 MED ORDER — LEVETIRACETAM 250 MG PO TABS: ORAL_TABLET | ORAL | 11 refills | Status: DC

## 2018-07-27 NOTE — Telephone Encounter (Signed)
Pt called yesterday as well as this morning  to go over medication and up date chart no answer voice mail left will try again before her appointment

## 2018-07-27 NOTE — Progress Notes (Signed)
Virtual Visit via Video Note The purpose of this virtual visit is to provide medical care while limiting exposure to the novel coronavirus.    Consent was obtained for video visit:  Yes.   Answered questions that patient had about telehealth interaction:  Yes.   I discussed the limitations, risks, security and privacy concerns of performing an evaluation and management service by telemedicine. I also discussed with the patient that there may be a patient responsible charge related to this service. The patient expressed understanding and agreed to proceed.  Pt location: Home Physician Location: office Name of referring provider:  Richmond CampbellKaplan, Kristen W., PA-C I connected with Kathy [name] at patients initiation/request on 07/27/2018 at 10:00 AM EDT by video enabled telemedicine application and verified that I am speaking with the correct person using two identifiers. 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 She was admitted in 02/2018 for back pain, anxiety, then had left arm jerking during hospital admission. MRI brain normal, her EEG showed 2-4 second bursts of generalized high voltage 4 Hz spike and polyspike and wave discharges consistent with JME. She was started on Levetiracetam which initially made her very moody. Mood is better, she feels B12 supplements help with irritability. For a time she self-reduced dose to 250mg  daily but called our office a few times to report worsening symptoms. She has been taking Levetiracetam 250mg  BID (in AM and afternoon) and states that symptoms are generally mild. She notices more body jerks with weather changes or at night if she does not go to bed at a certain time. Anxiety brings them on bad. In the past year, she has had 1 or 2 episodes where she had to go home from work. She has also noticed that when she  starts having urinary frequency every 10 minutes, she starts having more body jerks. Since childhood she has noticed small staring spells, she would be listening to someone and blanks out for a second. She was diagnosed with ADHD in childhood due to these spacing out spells. She states they are so mild no one would notice them. She denies any convulsions. She denies any headaches, dizziness, diplopia, no falls. Neck and back pain are not as bad when she is more active. She always feels that one side is weaker than the other, usually her right side, but sometimes the left side. She recalls taking Lamotrigine for mood swings in her teens, she felt like a zombie/sedated at that time.   Repeat EEG in January 2020 again showed frequent generalized high voltage 4 Hz spike and wave discharges lasting 2-5 seconds.  History on Initial Assessment 04/06/2017: This is a 28 year old right-handed woman with a history of bipolar disorder, depression, anxiety, ADHD, presenting after hospital admission last 03/11/2017. She recalls having back issues in the beginning of December and saw a chiropractor for adjustment. The next day, she felt lethargic and did not eat much. She recalls feeling anxious, then [place]. She started to have what she considers as a panic attack, she felt her left ear starting to go numb and she felt herself listing to her left side and feel dizzy. She started getting more anxious and "freaking out" with palpitations, drove to her mother's house 2 miles away. Her mother called EMS, and when they arrived  she was in the middle of a panic attack and reports her muscles on the left arm started to spasm. She shows a video on her phone that she took herself, she reports she was awake and alert, and could feel the muscles tightening at her upper back then the left arm jerking would start. She reports being given a muscle relaxant in the hospital, and the jerking  stopped. She was evaluated by Neurology during her admission, she reported left-sided weakness and occipital headaches, as well as arm spasms. she had a normal MRI brain and cervical spine without contrast which I personally reviewed. Due to the left arm spasms, she had an EEG done, which was abnormal showing occasional 2-4 second bursts of generalized high voltage 4 Hz spike and polyspike and wave discharges without clinical correlate. She was started on Keppra the day prior to hospital discharge, and reports the muscle relaxant was stopped. She came back to the ER that evening because she noticed increased pain and left arm spasms. She was given Flexeril and states this helped again with the spasms. She saw PT once since hospital discharge due to left-sided weakness, and feels her strength is better. She states that she has not had any further jerks/spasms since she went back to the ER and restarted the Flexeril. She stopped having the back pain, no further headaches, and stopped the muscle relaxant after a week, with no recurrence of symptoms. She self-tapered Keppra to 250mg  BID after 3 days because it made her very moody and mean. She started feeling a little anxious again the other day when she felt overstimulated at a children's party, but was able to calm herself down with no progression of symptoms. She has a diagnosis of ADHD and anxiety but does not want to take any medications.   She and her mother deny any staring/unresponsive episodes, gaps in time, olfactory/gustatory hallucinations, focal numbness/tingling/weakness, early morning myoclonus. She denies any further headaches, dizziness, diplopia, dysarthria/dysphagia, bowel dysfunction. She has interstitial cystitis. She was previously seen by one of my partners, Dr. Everlena Cooper in 2015 when she had left facial numbness, and was diagnosed with migraines. She has not had similar symptoms since. She had vertigo one time related to ear issues. She states she  no longer has any neck or back pain.   Epilepsy Risk Factors:  She had a normal birth and early development.  There is no history of febrile convulsions, CNS infections such as meningitis/encephalitis, significant traumatic brain injury, neurosurgical procedures, or family history of seizures.    Observations/Objective:   Vitals:   07/27/18 0934  Weight: 180 lb (81.6 kg)  Height: 5\' 5"  (1.651 m)   Patient is awake, alert, oriented x 3. No aphasia or dysarthria. Intact fluency and comprehension. Remote and recent memory intact. Able to name and repeat. Cranial nerves: Extraocular movements intact with no nystagmus. No facial asymmetry. Motor: moves all extremities symmetrically, at least anti-gravity x 4. No incoordination on finger to nose testing. Gait: narrow-based and steady, able to tandem walk adequately. Negative Romberg test.  Assessment and Plan:   This is a 28 yo RH woman with a history of ADHD, anxiety, bipolar disorder, depression, with Juvenile Myoclonic Epilepsy. Repeat EEGs have shown generalized high voltage 4 Hz spike and polyspike and wave discharges. MRI brain normal. She reports "mild" symptoms of occasional myoclonic jerks and very brief spacing out (absence seizures), we discussed she is on a very low dose of Levetiracetam 250mg  BID, there is a lot of  room for increase. We discussed that 80-90% of patients with JME can be well-controlled with medications. She is very hesitant to increase medication and would like to think about switching to Lamotrigine first before doing any changes. We discussed issues in women with epilepsy, including lower risks of fetal malformations on Lamotrigine and Levetiracetam. We discussed risks of prolonged absence seizures and recommendation to increase dose or switch medication, she would like to stay on Levetiracetam  BID for now, refills sent. She is aware of [place] driving laws to stop driving after an episode of loss of awareness/consciousness,  until 6 months seizure-free. Follow-up in 6 months, she knows to call for any changes.   Follow Up Instructions:   -I discussed the assessment and treatment plan with the patient. The patient was provided an opportunity to ask questions and all were answered. The patient agreed with the plan and demonstrated an understanding of the instructions.   The patient was advised to call back or seek an in-person evaluation if the symptoms worsen or if the condition fails to improve as anticipated.  Total Time spent in visit with the patient was:  25 minutes, of which more than 50% of the time was spent in counseling and/or coordinating care on the above.   Pt understands and agrees with the plan of care outlined.       Van Clines, MD

## 2018-07-30 ENCOUNTER — Ambulatory Visit: Payer: Medicaid Other | Admitting: Neurology

## 2018-07-30 ENCOUNTER — Encounter

## 2019-03-12 ENCOUNTER — Other Ambulatory Visit: Payer: Self-pay

## 2019-03-12 ENCOUNTER — Encounter: Payer: Self-pay | Admitting: Neurology

## 2019-03-12 ENCOUNTER — Telehealth (INDEPENDENT_AMBULATORY_CARE_PROVIDER_SITE_OTHER): Payer: Medicaid Other | Admitting: Neurology

## 2019-03-12 VITALS — Ht 64.0 in | Wt 170.0 lb

## 2019-03-12 DIAGNOSIS — G40B09 Juvenile myoclonic epilepsy, not intractable, without status epilepticus: Secondary | ICD-10-CM | POA: Diagnosis not present

## 2019-03-12 DIAGNOSIS — R29898 Other symptoms and signs involving the musculoskeletal system: Secondary | ICD-10-CM

## 2019-03-12 MED ORDER — LEVETIRACETAM 250 MG PO TABS: ORAL_TABLET | ORAL | 11 refills | Status: DC

## 2019-03-12 NOTE — Progress Notes (Signed)
Virtual Visit via Video Note The purpose of this virtual visit is to provide medical care while limiting exposure to the novel coronavirus.    Consent was obtained for video visit:  Yes.   Answered questions that patient had about telehealth interaction:  Yes.   I discussed the limitations, risks, security and privacy concerns of performing an evaluation and management service by telemedicine. I also discussed with the patient that there may be a patient responsible charge related to this service. The patient expressed understanding and agreed to proceed.  Pt location: Home Physician Location: office Name of referring provider:  Aletha Halim., PA-C I connected with Ieasha V L [place] at patients initiation/request on 03/12/2019 at 10:00 AM EST by video enabled telemedicine application and verified that I am speaking with the correct person using two identifiers. Pt MRN:  [id number] Pt DOB:  20-Jun-1990 Video Participants:  Orlean V L Henk  History of Present Illness:  The patient was seen as a virtual video visit on 03/12/2019. She was last seen 7 months ago in the neurology clinic for Juvenile Myoclonic Epilepsy. She was admitted for back pain in 02/2018 and had left arm jerking during the hospital admission. MRI brain normal, her 24-hour EEG showed 2-4 second bursts of generalized high voltage 4 Hz spike and polyspike and wave discharges consistent with JME. She initially had mood side effects on Levetiracetam, but is now tolerating it better. She is on a low dose 250mg  BID and denies any convulsions, spacing out. She mostly has brief 1 second twitching/jerking when she is very stressed out or dehydrated. She notices a headache when she is late to take her medication, otherwise no regular headaches. She has noticed right arm weakness that comes and goes, she describes it as feeling heavy/tired. No numbness/tingling. She has had pain in the back of her right shoulder blade prior to  being diagnosed with epilepsy and saw the chiropractor. She has occasional neck and shoulder pain. She has occasional word-finding difficulties. No dizziness, vision changes, no falls.   History on Initial Assessment 04/06/2017: This is a 28 year old right-handed woman with a history of bipolar disorder, depression, anxiety, ADHD, presenting after hospital admission last 03/11/2017. She recalls having back issues in the beginning of December and saw a chiropractor for adjustment. The next day, she felt lethargic and did not eat much. She recalls feeling anxious, then went to [place] for Christmas shopping with her children. She started to have what she considers as a panic attack, she felt her left ear starting to go numb and she felt herself listing to her left side and feel dizzy. She started getting more anxious and "freaking out" with palpitations, drove to her mother's house 2 miles away. Her mother called EMS, and when they arrived she was in the middle of a panic attack and reports her muscles on the left arm started to spasm. She shows a video on her phone that she took herself, she reports she was awake and alert, and could feel the muscles tightening at her upper back then the left arm jerking would start. She reports being given a muscle relaxant in the hospital, and the jerking stopped. She was evaluated by Neurology during her admission, she reported left-sided weakness and occipital headaches, as well as arm spasms. she had a normal MRI brain and cervical spine without contrast which I personally reviewed. Due to the left arm spasms, she had an EEG done, which was abnormal showing occasional 2-4  second bursts of generalized high voltage 4 Hz spike and polyspike and wave discharges without clinical correlate. She was started on Keppra the day prior to hospital discharge, and reports the muscle relaxant was stopped. She came back to the ER that evening because she noticed increased pain and left arm  spasms. She was given Flexeril and states this helped again with the spasms. She saw PT once since hospital discharge due to left-sided weakness, and feels her strength is better. She states that she has not had any further jerks/spasms since she went back to the ER and restarted the Flexeril. She stopped having the back pain, no further headaches, and stopped the muscle relaxant after a week, with no recurrence of symptoms. She self-tapered Keppra to 250mg  BID after 3 days because it made her very moody and mean. She started feeling a little anxious again the other day when she felt overstimulated at a children's party, but was able to calm herself down with no progression of symptoms. She has a diagnosis of ADHD and anxiety but does not want to take any medications.   She and her mother deny any staring/unresponsive episodes, gaps in time, olfactory/gustatory hallucinations, focal numbness/tingling/weakness, early morning myoclonus. She denies any further headaches, dizziness, diplopia, dysarthria/dysphagia, bowel dysfunction. She has interstitial cystitis. She was previously seen by one of my partners, Dr. in 2015 when she had left facial numbness, and was diagnosed with migraines. She has not had similar symptoms since. She had vertigo one time related to ear issues. She states she no longer has any neck or back pain.   Epilepsy Risk Factors:  She had a normal birth and early development.  There is no history of febrile convulsions, CNS infections such as meningitis/encephalitis, significant traumatic brain injury, neurosurgical procedures, or family history of seizures.  Diagnostic Data: Repeat EEG in January 2020 again showed frequent generalized high voltage 4 Hz spike and wave discharges lasting 2-5 seconds.    Observations/Objective:   Vitals:   03/12/19 0912  Weight: 170 lb (77.1 kg)  Height: 5\' 4"  (1.626 m)   Patient is awake, alert, oriented x 3. No aphasia or dysarthria. Intact  fluency and comprehension. Remote and recent memory intact. Able to name and repeat. Cranial nerves: Extraocular movements intact with no nystagmus. No facial asymmetry. Motor: moves all extremities symmetrically, at least anti-gravity x 4. No incoordination on finger to nose testing. Gait: narrow-based and steady, able to tandem walk adequately.    Assessment and Plan:   This is a 27 yo RH woman with a history of ADHD, anxiety, bipolar disorder, depression, with Juvenile Myoclonic Epilepsy. Repeat EEGs have shown generalized high voltage 4 Hz spike and polyspike and wave discharges. MRI brain normal. She is overall doing well on low dose Levetiracetam 250mg  BID. She denies any spacing out, and mostly has brief myoclonic jerks with stress/dehydration. No convulsions. She is reporting right arm weakness/heaviness, EMG/NCV of the right upper extremity will be ordered to further evaluate her symptoms. We again discussed that her medication dose is low, but since she is doing well, we agreed to continue on low dose for now, she knows to call for any change in symptoms. She is aware of [place] driving laws to stop driving after an episode of loss of awareness/consciousness, until 6 months seizure-free. Follow-up in 6 months, she knows to call for any changes.   Follow Up Instructions:   -I discussed the assessment and treatment plan with the patient. The patient was provided  an opportunity to ask questions and all were answered. The patient agreed with the plan and demonstrated an understanding of the instructions.   The patient was advised to call back or seek an in-person evaluation if the symptoms worsen or if the condition fails to improve as anticipated.    Van ClinesKaren M Oziah Vitanza, MD

## 2019-03-28 ENCOUNTER — Encounter: Payer: Medicaid Other | Admitting: Neurology

## 2019-03-28 ENCOUNTER — Encounter

## 2019-04-08 ENCOUNTER — Other Ambulatory Visit: Payer: Self-pay | Admitting: Family Medicine

## 2019-04-08 DIAGNOSIS — N6310 Unspecified lump in the right breast, unspecified quadrant: Secondary | ICD-10-CM

## 2019-04-08 DIAGNOSIS — R1011 Right upper quadrant pain: Secondary | ICD-10-CM

## 2019-04-08 DIAGNOSIS — N6320 Unspecified lump in the left breast, unspecified quadrant: Secondary | ICD-10-CM

## 2019-04-12 ENCOUNTER — Other Ambulatory Visit: Payer: Self-pay | Admitting: Family Medicine

## 2019-04-18 ENCOUNTER — Ambulatory Visit
Admission: RE | Admit: 2019-04-18 | Discharge: 2019-04-18 | Disposition: A | Discharge status: Home / Self Care | Payer: Medicaid Other | Source: Ambulatory Visit | Attending: Family Medicine | Admitting: Family Medicine

## 2019-04-18 ENCOUNTER — Other Ambulatory Visit: Payer: Self-pay

## 2019-04-18 DIAGNOSIS — N6320 Unspecified lump in the left breast, unspecified quadrant: Secondary | ICD-10-CM

## 2019-04-18 DIAGNOSIS — N6310 Unspecified lump in the right breast, unspecified quadrant: Secondary | ICD-10-CM

## 2019-04-18 DIAGNOSIS — R1011 Right upper quadrant pain: Secondary | ICD-10-CM

## 2019-08-13 ENCOUNTER — Other Ambulatory Visit: Payer: Self-pay

## 2019-08-13 ENCOUNTER — Telehealth: Payer: Self-pay | Admitting: Neurology

## 2019-08-13 MED ORDER — LEVETIRACETAM 250 MG PO TABS: ORAL_TABLET | ORAL | 11 refills | Status: DC

## 2019-08-13 NOTE — Telephone Encounter (Signed)
Patient needs refill of Keppra 250mg  Sent to [facility] on [address]

## 2019-08-13 NOTE — Telephone Encounter (Signed)
Sent to [facility] to sign rx, pharmacy changed

## 2019-08-14 ENCOUNTER — Other Ambulatory Visit: Payer: Self-pay

## 2019-08-14 ENCOUNTER — Other Ambulatory Visit: Payer: Self-pay | Admitting: Neurology

## 2019-08-14 MED ORDER — LEVETIRACETAM 250 MG PO TABS: ORAL_TABLET | ORAL | 11 refills | Status: DC

## 2019-08-22 ENCOUNTER — Ambulatory Visit: Payer: Medicaid Other | Admitting: Physical Therapy

## 2019-08-28 IMAGING — US US ABDOMEN LIMITED
1 series · 14 of 25 positions shown · non-contrast
Comparison: Right upper quadrant ultrasound 05/16/2016. CT abdomen
and pelvis 11/03/2016.

CLINICAL DATA: Right upper quadrant pain for 2 days.

EXAM:
ULTRASOUND ABDOMEN LIMITED RIGHT UPPER QUADRANT

[Series 1: us abdomen limited · 0.23mm/px · 14 of 42 slices shown]
[im 1/42]
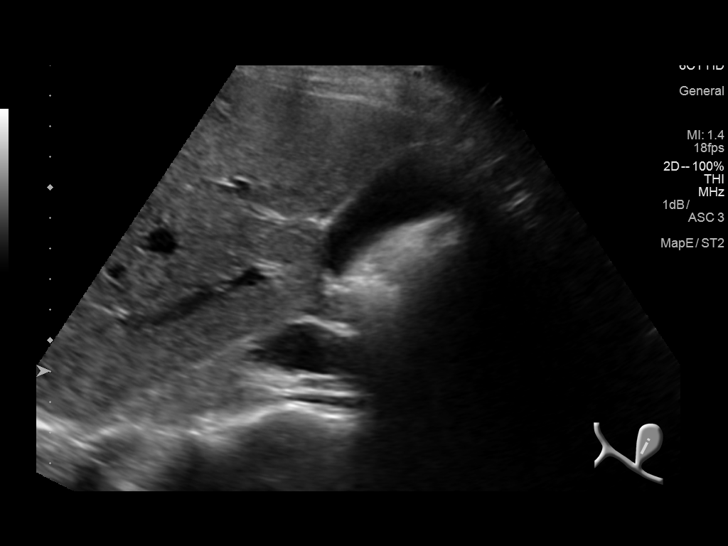
[im 4/42]
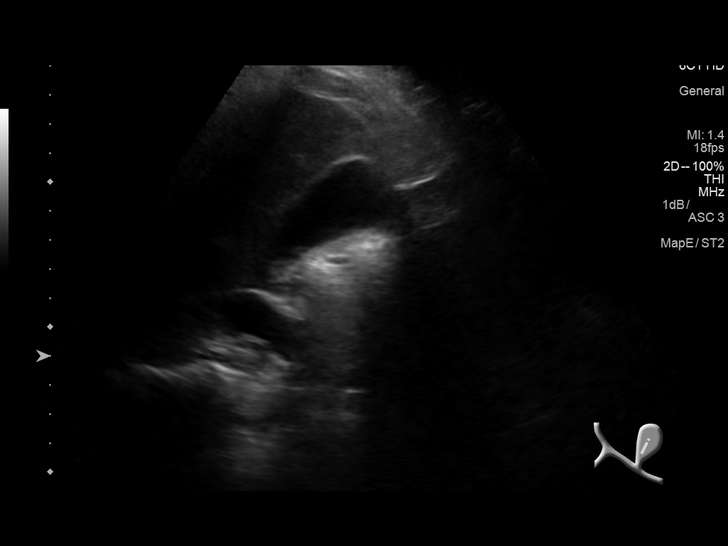
[im 7/42]
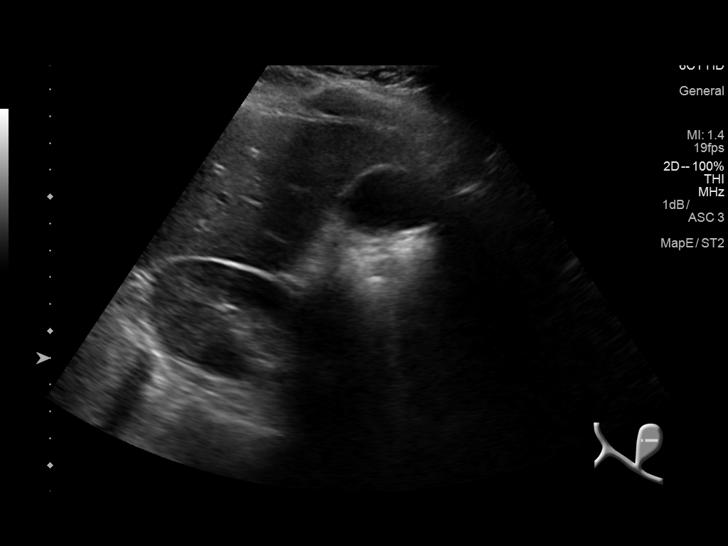
[im 11/42]
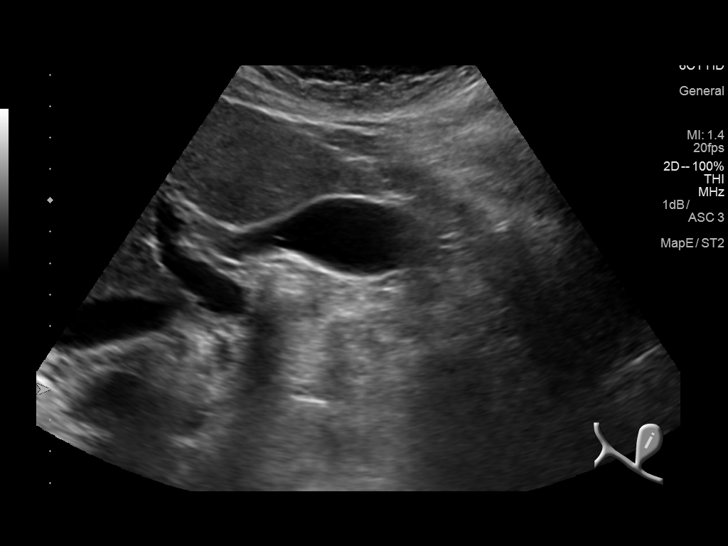
[im 14/42]
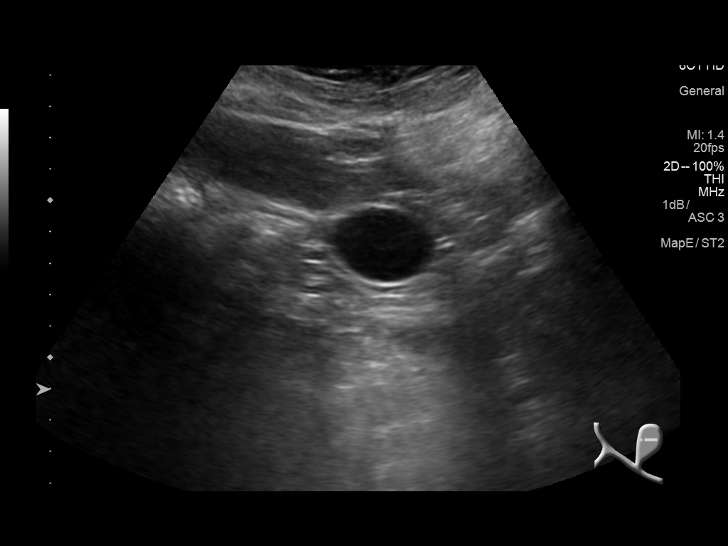
[im 16/42]
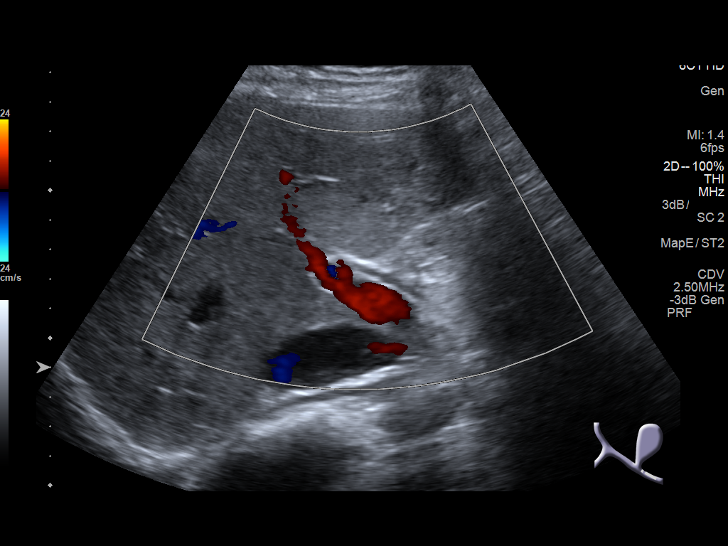
[im 19/42]
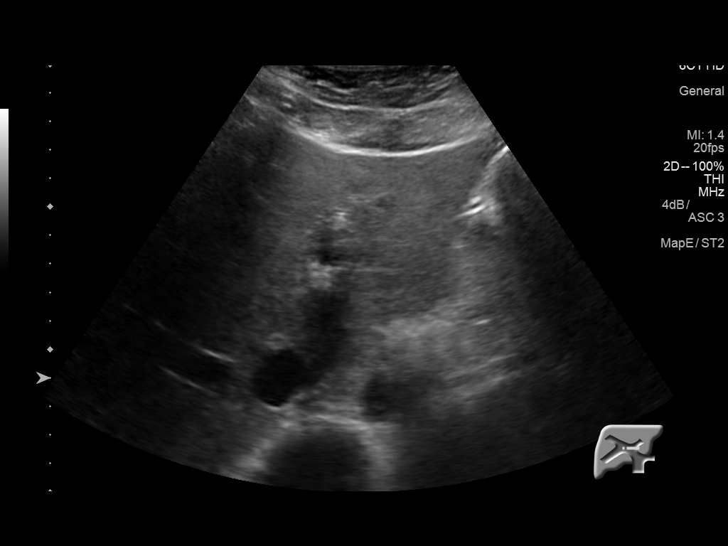
[im 23/42]
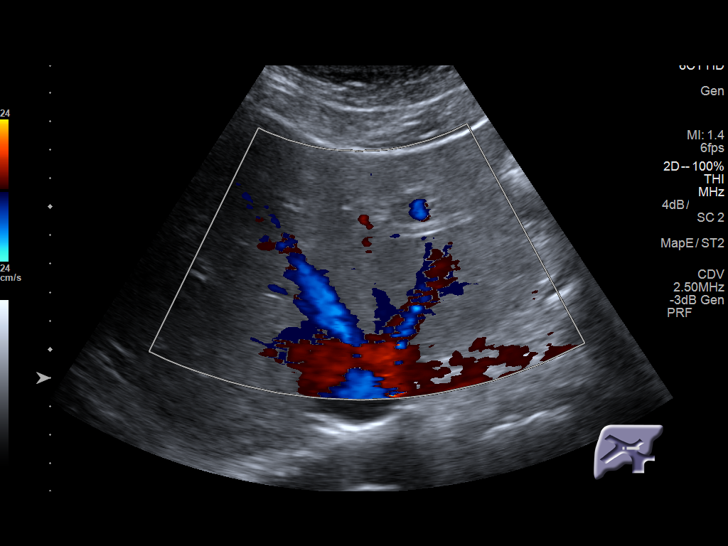
[im 26/42]
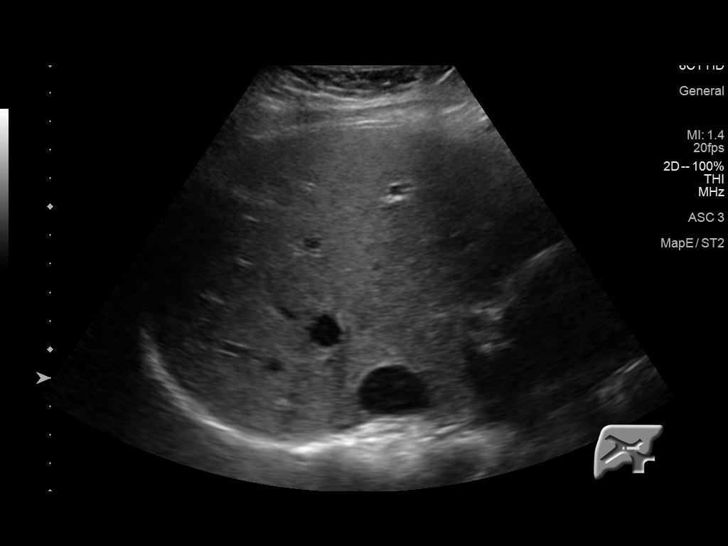
[im 28/42]
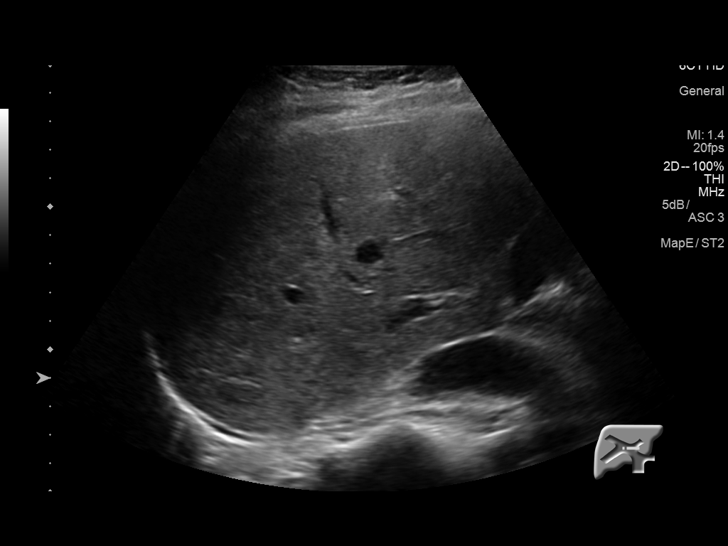
[im 31/42]
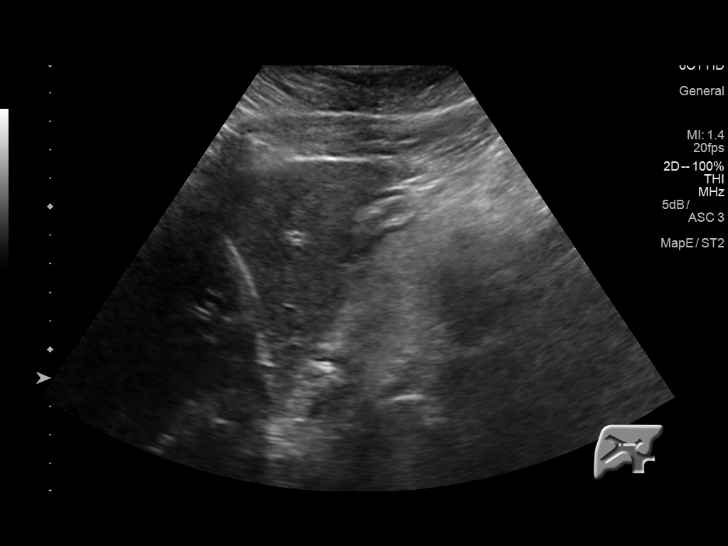
[im 35/42]
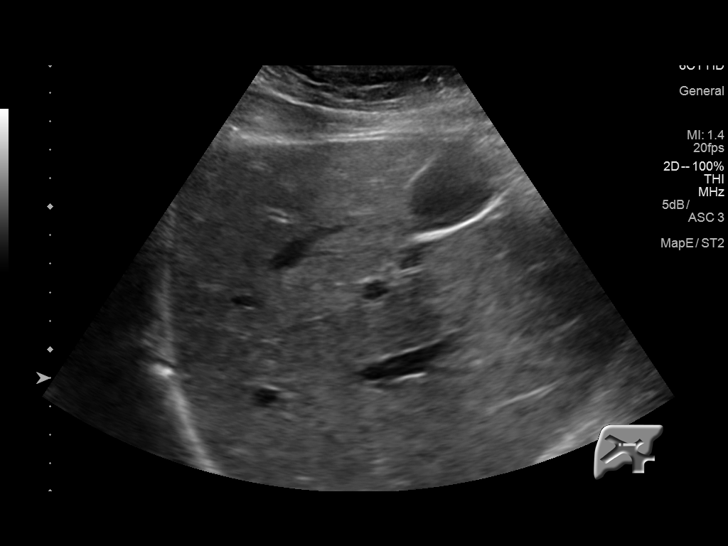
[im 38/42]
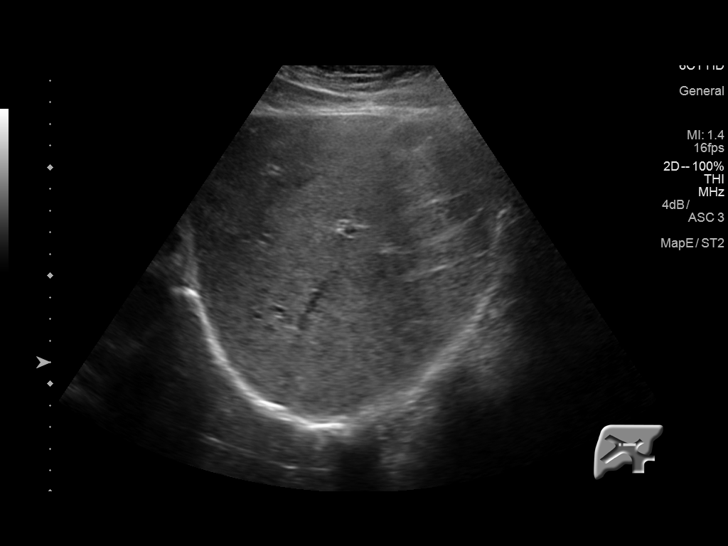
[im 42/42]
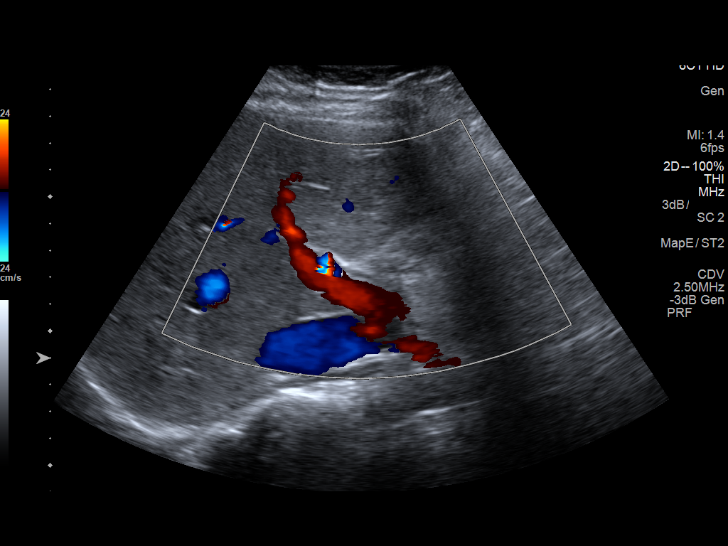

[14 of 25 positions shown; findings below may reference images not displayed]

FINDINGS: Gallbladder:

No gallstones or wall thickening visualized. No sonographic Murphy
sign noted by sonographer.

Common bile duct:

Diameter: 0.2 cm.

Liver:

No focal lesion identified. Within normal limits in parenchymal
echogenicity. Portal vein is patent on color Doppler imaging with
normal direction of blood flow towards the liver.
IMPRESSION: Normal exam.  Negative for gallstones.

## 2019-10-09 ENCOUNTER — Other Ambulatory Visit: Payer: Self-pay

## 2019-10-09 ENCOUNTER — Encounter: Payer: Self-pay | Admitting: Neurology

## 2019-10-09 ENCOUNTER — Ambulatory Visit (INDEPENDENT_AMBULATORY_CARE_PROVIDER_SITE_OTHER): Payer: Medicaid Other | Admitting: Neurology

## 2019-10-09 VITALS — BP 97/53 | HR 65 | Ht 64.0 in | Wt 180.0 lb

## 2019-10-09 DIAGNOSIS — G40B09 Juvenile myoclonic epilepsy, not intractable, without status epilepticus: Secondary | ICD-10-CM | POA: Diagnosis not present

## 2019-10-09 MED ORDER — ZONISAMIDE 100 MG PO CAPS: ORAL_CAPSULE | ORAL | 11 refills | Status: DC

## 2019-10-09 NOTE — Progress Notes (Signed)
NEUROLOGY FOLLOW UP OFFICE NOTE  Kathy [name] [id number] [birth year]  HISTORY OF PRESENT ILLNESS: I had the pleasure of seeing Kathy [name] in follow-up in the neurology clinic on 10/09/2019.  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 She is alone in the office today. She was admitted for back pain in 02/2018 and had left arm jerking during her hospital admission. MRI brain normal, her 24-hour EEG showed 2-4 second bursts of generalized high voltage 4 Hz spike and polyspike and wave discharges consistent with JME. She has been on low dose Levetiracetam 250mg  BID and denies any convulsions or myoclonic jerks. The most she feels are a "body aura thing" where she gets tired and has urinary frequency, feeling out of it for a few seconds then she feels okay. These are infrequent, mostly when she is sleep deprived, or related to hormones or weather changes. She however has noticed that since starting the Levetiracetam, she has had word-finding difficulties and short-term memory changes. She has always had "memory like an elephant" so this has been noticeable and frustrating for her. She also gained weight since starting the LEV and despite working out 4 times a week and vegan diet, she cannot lose weight. She was previously reporting right arm weakness which has improved with exercise. She states her mood is okay, anxiety is mostly due to the symptoms she feels since starting the Levetiracetam.   History on Initial Assessment 04/06/2017: This is a 29 year old right-handed woman with a history of bipolar disorder, depression, anxiety, ADHD, presenting after hospital admission last 03/11/2017. She recalls having back issues in the beginning of December and saw a chiropractor for adjustment. The next day, she felt lethargic and did not eat much. She recalls feeling anxious, then went to [place] for Christmas shopping with her children. She started to have what  she considers as a panic attack, she felt her left ear starting to go numb and she felt herself listing to her left side and feel dizzy. She started getting more anxious and "freaking out" with palpitations, drove to her mother's house 2 miles away. Her mother called EMS, and when they arrived she was in the middle of a panic attack and reports her muscles on the left arm started to spasm. She shows a video on her phone that she took herself, she reports she was awake and alert, and could feel the muscles tightening at her upper back then the left arm jerking would start. She reports being given a muscle relaxant in the hospital, and the jerking stopped. She was evaluated by Neurology during her admission, she reported left-sided weakness and occipital headaches, as well as arm spasms. she had a normal MRI brain and cervical spine without contrast which I personally reviewed. Due to the left arm spasms, she had an EEG done, which was abnormal showing occasional 2-4 second bursts of generalized high voltage 4 Hz spike and polyspike and wave discharges without clinical correlate. She was started on Keppra the day prior to hospital discharge, and reports the muscle relaxant was stopped. She came back to the ER that evening because she noticed increased pain and left arm spasms. She was given Flexeril and states this helped again with the spasms. She saw PT once since hospital discharge due to left-sided weakness, and feels her strength is better. She states that she has not had any further jerks/spasms since she went back to the ER and restarted the Flexeril. She  stopped having the back pain, no further headaches, and stopped the muscle relaxant after a week, with no recurrence of symptoms. She self-tapered Keppra to 250mg  BID after 3 days because it made her very moody and mean. She started feeling a little anxious again the other day when she felt overstimulated at a children's party, but was able to calm herself  down with no progression of symptoms. She has a diagnosis of ADHD and anxiety but does not want to take any medications.   She and her mother deny any staring/unresponsive episodes, gaps in time, olfactory/gustatory hallucinations, focal numbness/tingling/weakness, early morning myoclonus. She denies any further headaches, dizziness, diplopia, dysarthria/dysphagia, bowel dysfunction. She has interstitial cystitis. She was previously seen by one of my partners, Dr. in 2015 when she had left facial numbness, and was diagnosed with migraines. She has not had similar symptoms since. She had vertigo one time related to ear issues. She states she no longer has any neck or back pain.   Epilepsy Risk Factors:  She had a normal birth and early development.  There is no history of febrile convulsions, CNS infections such as meningitis/encephalitis, significant traumatic brain injury, neurosurgical procedures, or family history of seizures.  Diagnostic Data: Repeat EEG in January 2020 again showed frequent generalized high voltage 4 Hz spike and wave discharges lasting 2-5 seconds.   PAST MEDICAL HISTORY: Past Medical History:  Diagnosis Date  . Asthma   . Bipolar 1 disorder (HCC)   . Cystitis, interstitial   . Depression    better now, situational, had pp with last child  . Dysthymic disorder   . Frequency of urination   . Headache(784.0)   . History of abnormal cervical Pap smear   . History of ovarian cyst   . History of pelvic inflammatory disease   . History of suicide attempt    06/ 2012  overdose oxycontin  . HSV-1 (herpes simplex virus 1) infection   . Nocturia   . Orthostatic hypotension   . Pelvic pain in female   . Seizures (HCC)   . Vaginal Pap smear, abnormal    ok since    MEDICATIONS: Current Outpatient Medications on File Prior to Visit  Medication Sig Dispense Refill  . ferrous sulfate 325 (65 FE) MG tablet Take 325 mg by mouth daily with breakfast.    .  Fexofenadine HCl (ALLEGRA ALLERGY PO) Take 1 tablet by mouth daily as needed (allergies).    . vitamin B-12 (CYANOCOBALAMIN) 250 MCG tablet Take 250 mcg by mouth daily.     No current facility-administered medications on file prior to visit.    ALLERGIES: Allergies  Allergen Reactions  . Diflucan [Fluconazole] Itching and Rash  . Latex Itching and Rash    redness    FAMILY HISTORY: Family History  Adopted: Yes  Problem Relation Age of Onset  . Breast cancer Maternal Grandmother   . Anesthesia problems Neg Hx     SOCIAL HISTORY: Social History   Socioeconomic History  . Marital status: Single    Spouse name: Not on file  . Number of children: 3  . Years of education: Not on file  . Highest education level: Not on file  Occupational History  . Not on file  Tobacco Use  . Smoking status: Former Smoker    Packs/day: 0.25    Years: 0.30    Pack years: 0.07    Types: Cigarettes    Quit date: 03/28/2014    Years since quitting: 5.5  .  Smokeless tobacco: Never Used  Vaping Use  . Vaping Use: Never used  Substance and Sexual Activity  . Alcohol use: No    Comment: Sober a month ago  . Drug use: No  . Sexual activity: Yes    Partners: Male    Birth control/protection: None  Other Topics Concern  . Not on file  Social History Narrative   Right handed      Highest level of edu- college      Two story home. Has three children   Social Determinants of Health   Financial Resource Strain:   . Difficulty of Paying Living Expenses:   Food Insecurity:   . Worried About Programme researcher, broadcasting/film/video in the Last Year:   . Barista in the Last Year:   Transportation Needs:   . Freight forwarder (Medical):   Marland Kitchen Lack of Transportation (Non-Medical):   Physical Activity:   . Days of Exercise per Week:   . Minutes of Exercise per Session:   Stress:   . Feeling of Stress :   Social Connections:   . Frequency of Communication with Friends and Family:   . Frequency of  Social Gatherings with Friends and Family:   . Attends Religious Services:   . Active Member of Clubs or Organizations:   . Attends Banker Meetings:   Marland Kitchen Marital Status:   Intimate Partner Violence:   . Fear of Current or Ex-Partner:   . Emotionally Abused:   Marland Kitchen Physically Abused:   . Sexually Abused:     PHYSICAL EXAM: Vitals:   10/09/19 0803  BP: (!) 97/53  Pulse: 65  SpO2: 99%   General: No acute distress, became tearful Head:  Normocephalic/atraumatic Skin/Extremities: No rash, no edema Neurological Exam: alert and oriented to person, place, and time. No aphasia or dysarthria. Fund of knowledge is appropriate.  Recent and remote memory are intact.  Attention and concentration are normal.   Cranial nerves: Pupils equal, round, reactive to light.  Extraocular movements intact with no nystagmus. Visual fields full.No facial asymmetry. Motor: Bulk and tone normal, muscle strength 5/5 throughout with no pronator drift. Finger to nose testing intact.  Gait narrow-based and steady, able to tandem walk adequately.  Romberg negative.   IMPRESSION: This is a 29 yo RH woman with a history of ADHD, anxiety, bipolar disorder, depression, with Juvenile Myoclonic Epilepsy. Repeat EEGs have shown generalized high voltage 4 Hz spike and polyspike and wave discharges. MRI brain normal. She has not had any significant seizures on low dose Levetiracetam 250mg  BID, however she does not like symptoms of weight gain and word-finding difficulties that started after she initiated Levetiracetam. She would like to be off medication, however we discussed risks and benefits, and recommendation of taking AED for seizure prophylaxis. We discussed switching to a different AED that she may tolerate better, Zonisamide side effect can be weight loss. She will start low dose Zonisamide 100mg  qhs, side effects discussed. She will start weaning off Levetiracetam, instructions provided. She is aware of [place] driving  laws to stop driving after an episode of loss of consciousness/awareness until 6 months seizure-free. Follow-up in 4 months, she knows to call for any changes.   Thank you for allowing me to participate in her care.  Please do not hesitate to call for any questions or concerns.   , M.D.   CC: , PA-C

## 2019-10-09 NOTE — Patient Instructions (Addendum)
1. Start the Zonisamide 100mg : Take 1 capsule every night  2. Continue Keppra 250mg  twice a day for a week, then reduce to 1 tab daily for a week, then stop. Monitor word-finding difficulties and weight once you get off Keppra, hoping for improvement!  3. Follow-up in 4 months, call for any changes  Seizure Precautions: 1. If medication has been prescribed for you to prevent seizures, take it exactly as directed.  Do not stop taking the medicine without talking to your doctor first, even if you have not had a seizure in a long time.   2. Avoid activities in which a seizure would cause danger to yourself or to others.  Don't operate dangerous machinery, swim alone, or climb in high or dangerous places, such as on ladders, roofs, or girders.  Do not drive unless your doctor says you may.  3. If you have any warning that you may have a seizure, lay down in a safe place where you can't hurt yourself.    4.  No driving for 6 months from last seizure, as per [facility].   Please refer to the following link on the Epile[employer] of [employer]'s website for more information: http://www.epilepsyfoundation.org/answerplace/Social/driving/drivingu.cfm   5.  Maintain good sleep hygiene. Avoid alcohol.  6.  Notify your neurology if you are planning pregnancy or if you become pregnant.  7.  Contact your doctor if you have any problems that may be related to the medicine you are taking.  8.  Call 911 and bring the patient back to the ED if:        A.  The seizure lasts longer than 5 minutes.       B.  The patient doesn't awaken shortly after the seizure  C.  The patient has new problems such as difficulty seeing, speaking or moving  D.  The patient was injured during the seizure  E.  The patient has a temperature over 102 F (39C)  F.  The patient vomited and now is having trouble breathing

## 2019-10-10 ENCOUNTER — Telehealth: Payer: Self-pay | Admitting: Neurology

## 2019-10-10 NOTE — Telephone Encounter (Signed)
Pt c/o:  side effect on medication New medication started: zonisamide When did they start medication?  10/09/2019 HS When did side effects start? This AM Side effects reported: states she feels "tipsy" like she drank too much, didn't feel safe going to work today Still taking medication? Yes.     Pt wondering if this is because she is still taking Keppra as well, wants to be able to work, wondering if this is common and should wear off soon? Told her that Dr Karel Jarvis suggests taking the medication earlier in the evening so that SE will wear off sooner and that if she can cont for 1-2 weeks the SEs should decrease, she verbalized understanding and will try taking it closer to dinner time instead of HS. She knows to call for any further questions.

## 2019-10-10 NOTE — Telephone Encounter (Signed)
Patient called in and left a message asking to speak with someone to go over some side effect questions on her new medicine.

## 2019-10-18 ENCOUNTER — Telehealth: Payer: Self-pay | Admitting: Neurology

## 2019-10-18 NOTE — Telephone Encounter (Signed)
Tried to call patient, went straight to voicemail, left message for patient to call back with best time/number to give her a call.

## 2019-10-18 NOTE — Telephone Encounter (Signed)
AccessNurse 10/17/19 @ 7:59PM:  "Caller states that she started a new medication and she now has a rash around one of her eyes. She started the medication a week ago. Zonegran.   Nurse assessment: Caller states that she started a new medication and she now has a rash around one of her eyes for 24 hours now. She started the medication a week ago- Zonegran 100mg  nightly. States she has noticed her eyes have been really dry since she started it, feeling pins/needle feeling as well. Taking Keppra 250mg  daily, decreased 2 days ago from 500mg  daily.  Patient was advised according to Rash or Redness - Localized guidelines. On Call Physician was paged and reached. RN relayed triage outcome and caller symptoms to Dr. . Instructed to go back up on the Keppra to 500mg  daily, stop the zonegran, and call Dr. tomorrow for further direction. RN relayed provider instruction to caller. Caller verbalized understanding."

## 2019-10-22 ENCOUNTER — Telehealth: Payer: Self-pay | Admitting: Neurology

## 2019-10-22 NOTE — Telephone Encounter (Signed)
I know she wants to get off medication, but we discussed that this is not my recommendation. We can try staying on the very low dose of Keppra 250mg  once a day, but if she starts having any seizure-like symptoms, we will either go back to 250mg  BID or talk about another medication again. We can do that on her f/u in Nov. If she would like to stay on low dose Keppra 250mg  daily, pls send in Rx, thanks

## 2019-10-22 NOTE — Telephone Encounter (Signed)
Patient called in and left a message. Her rash is better, she is wondering if she needs a new prescription of her Kathy [name]. If she can decrease and how to decrease that?

## 2019-10-24 NOTE — Telephone Encounter (Signed)
Spoke with pt who states she is taking Keppra 250 mg BID. She was taking Keppra in the AM and Zonegran at night and felt good then had the rash so resumed Keppra 250 mg BID because Zonegran stopped. She says every time she just takes Keppra 250 mg daily she gets really tired and sluggish. She would like to try getting off Keppra but she cannot tolerate the tiredness. She is hoping to try another medication so she can try going down on Keppra again. Told her I'd let Dr Karel Jarvis know and call her back with update.

## 2019-10-25 NOTE — Telephone Encounter (Signed)
Called patient back, she is at work and cannot talk currently. We agreed to touch base on Monday morning, I'll call her. Thanks

## 2019-10-28 NOTE — Telephone Encounter (Signed)
Spoke to patient. Initially, eyes were really dry, then started having rash around her right eye, bumps that were itchy, watery eyes. Liked med because she felt sharper on it, could think more clearly, lost weight. Back to taking Keppra 250mg  BID. Tried Lamictal as a teenager for mood and did not do well on it, made her like a zombie. Felt generally good when she was only on 250mg  once a day, then in the winter time she was having more twitches. Discussed that this is a very low dose but since she was on this before, we can try going down again but she knows to call for any changes. She would like to stay on Keppra for now and we can discuss other options on her next f/u.

## 2019-11-25 ENCOUNTER — Telehealth: Payer: Self-pay | Admitting: Neurology

## 2019-11-25 NOTE — Telephone Encounter (Signed)
Patient left a voicemail wanting to up her meds, she is having symptoms she wants taken care of. Please call.

## 2019-11-26 NOTE — Telephone Encounter (Signed)
Having little twitches again, mood drastically changed. Went back to 250mg  BID which made her feel a lot better. Only issue is when she was on 250mg , drastically lost weight quickly. She feels it is water weight, she exercises 3 times a week and is vegan. We discussed options, including re-trial of Lamictal, or Topamax (no current pregnancy plans), or Vimpat. Agreed to continue on Keppra for now and discuss options on f/u in Nov. She was advised to also speak with PCP if any other suggestion for weight.

## 2020-02-05 ENCOUNTER — Other Ambulatory Visit: Payer: Self-pay

## 2020-02-05 ENCOUNTER — Encounter: Payer: Self-pay | Admitting: Neurology

## 2020-02-05 ENCOUNTER — Ambulatory Visit: Payer: Medicaid Other | Admitting: Neurology

## 2020-02-05 VITALS — BP 111/68 | HR 61 | Ht 65.0 in | Wt 180.0 lb

## 2020-02-05 DIAGNOSIS — G40B09 Juvenile myoclonic epilepsy, not intractable, without status epilepticus: Secondary | ICD-10-CM

## 2020-02-05 MED ORDER — LEVETIRACETAM 250 MG PO TABS: 250.0000 mg | ORAL_TABLET | Freq: Two times a day (BID) | ORAL | 3 refills | Status: DC

## 2020-02-05 NOTE — Progress Notes (Signed)
NEUROLOGY FOLLOW UP OFFICE NOTE  Kathy [name] [id number] Sep 13, 1990  HISTORY OF PRESENT ILLNESS: I had the pleasure of seeing Kathy [name] in follow-up in the neurology clinic on 02/05/2020.  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 On her last visit, she was started on Zonisamide due to side effects on Levetiracetam, however she called our office a day later reporting drowsiness, then a week later to report a rash. Zonisamide was stopped and she went back to Levetiracetam 250mg  daily, however noticed little twitches again, as well as drastic changes in mood. She was noticing more pain in her upper back/shoulders due to muscle spasms. She was having more lapses with word-finding. She felt a weird out of body sensation like she was here but not here, not connected to her body. She had 2 bigger episodes, one while at work she felt sick and had to urinate a lot. She felt twitching in her right arm. She had to lie down in a fetal position and started crying. She could not verbalize what was happening. She always feels a rush of emotions after and cries. She was tired after but was back to her baseline in 15- 20 minutes. She had another episode while at a store and she had to go in her car and cried. Triggers appear to be dehydration, when she is not getting enough salt, potassium, and magnesium. Her LEV dose was increased to 250mg  BID in August and now she feels norma. She has slight twitching in her legs that may be due more to dehydration. She did notice losing the weight with reduction in Levetiracetam.    History on Initial Assessment 04/06/2017: This is a 29 year old right-handed woman with a history of bipolar disorder, depression, anxiety, ADHD, presenting after hospital admission last 03/11/2017. She recalls having back issues in the beginning of December and saw a chiropractor for adjustment. The next day, she felt lethargic and did not eat  much. She recalls feeling anxious, then went to [place] for Christmas shopping with her children. She started to have what she considers as a panic attack, she felt her left ear starting to go numb and she felt herself listing to her left side and feel dizzy. She started getting more anxious and "freaking out" with palpitations, drove to her mother's house 2 miles away. Her mother called EMS, and when they arrived she was in the middle of a panic attack and reports her muscles on the left arm started to spasm. She shows a video on her phone that she took herself, she reports she was awake and alert, and could feel the muscles tightening at her upper back then the left arm jerking would start. She reports being given a muscle relaxant in the hospital, and the jerking stopped. She was evaluated by Neurology during her admission, she reported left-sided weakness and occipital headaches, as well as arm spasms. she had a normal MRI brain and cervical spine without contrast which I personally reviewed. Due to the left arm spasms, she had an EEG done, which was abnormal showing occasional 2-4 second bursts of generalized high voltage 4 Hz spike and polyspike and wave discharges without clinical correlate. She was started on Keppra the day prior to hospital discharge, and reports the muscle relaxant was stopped. She came back to the ER that evening because she noticed increased pain and left arm spasms. She was given Flexeril and states this helped again with the spasms. She  saw PT once since hospital discharge due to left-sided weakness, and feels her strength is better. She states that she has not had any further jerks/spasms since she went back to the ER and restarted the Flexeril. She stopped having the back pain, no further headaches, and stopped the muscle relaxant after a week, with no recurrence of symptoms. She self-tapered Keppra to 250mg  BID after 3 days because it made her very moody and mean. She started  feeling a little anxious again the other day when she felt overstimulated at a children's party, but was able to calm herself down with no progression of symptoms. She has a diagnosis of ADHD and anxiety but does not want to take any medications.   She and her mother deny any staring/unresponsive episodes, gaps in time, olfactory/gustatory hallucinations, focal numbness/tingling/weakness, early morning myoclonus. She denies any further headaches, dizziness, diplopia, dysarthria/dysphagia, bowel dysfunction. She has interstitial cystitis. She was previously seen by one of my partners, Dr. Everlena CooperJaffe in 2015 when she had left facial numbness, and was diagnosed with migraines. She has not had similar symptoms since. She had vertigo one time related to ear issues. She states she no longer has any neck or back pain.   Epilepsy Risk Factors:  She had a normal birth and early development.  There is no history of febrile convulsions, CNS infections such as meningitis/encephalitis, significant traumatic brain injury, neurosurgical procedures, or family history of seizures.  Diagnostic Data: Repeat EEG in January 2020 again showed frequent generalized high voltage 4 Hz spike and wave discharges lasting 2-5 seconds.  Prior AEDs: Zonisamide, Lamotrigine  PAST MEDICAL HISTORY: Past Medical History:  Diagnosis Date  . Asthma   . Bipolar 1 disorder (HCC)   . Cystitis, interstitial   . Depression    better now, situational, had pp with last child  . Dysthymic disorder   . Frequency of urination   . Headache(784.0)   . History of abnormal cervical Pap smear   . History of ovarian cyst   . History of pelvic inflammatory disease   . History of suicide attempt    06/ 2012  overdose oxycontin  . HSV-1 (herpes simplex virus 1) infection   . Nocturia   . Orthostatic hypotension   . Pelvic pain in female   . Seizures (HCC)   . Vaginal Pap smear, abnormal    ok since    MEDICATIONS: Current Outpatient  Medications on File Prior to Visit  Medication Sig Dispense Refill  . ferrous sulfate 325 (65 FE) MG tablet Take 325 mg by mouth daily with breakfast.    . Fexofenadine HCl (ALLEGRA ALLERGY PO) Take 1 tablet by mouth daily as needed (allergies).    Marland Kitchen. levETIRAcetam (KEPPRA) 250 MG tablet Take 250 mg by mouth 2 (two) times daily.    . vitamin B-12 (CYANOCOBALAMIN) 250 MCG tablet Take 250 mcg by mouth daily.     No current facility-administered medications on file prior to visit.    ALLERGIES: Allergies  Allergen Reactions  . Diflucan [Fluconazole] Itching and Rash  . Latex Itching and Rash    redness  . Zonegran [Zonisamide] Rash    FAMILY HISTORY: Family History  Adopted: Yes  Problem Relation Age of Onset  . Breast cancer Maternal Grandmother   . Anesthesia problems Neg Hx     SOCIAL HISTORY: Social History   Socioeconomic History  . Marital status: Single    Spouse name: Not on file  . Number of children: 3  .  Years of education: Not on file  . Highest education level: Not on file  Occupational History  . Not on file  Tobacco Use  . Smoking status: Former Smoker    Packs/day: 0.25    Years: 0.30    Pack years: 0.07    Types: Cigarettes    Quit date: 03/28/2014    Years since quitting: 5.8  . Smokeless tobacco: Never Used  Vaping Use  . Vaping Use: Never used  Substance and Sexual Activity  . Alcohol use: No    Comment: Sober a month ago  . Drug use: No  . Sexual activity: Yes    Partners: Male    Birth control/protection: None  Other Topics Concern  . Not on file  Social History Narrative   Right handed      Highest level of edu- college      Two story home. Has three children   Social Determinants of Health   Financial Resource Strain:   . Difficulty of Paying Living Expenses: Not on file  Food Insecurity:   . Worried About Programme researcher, broadcasting/film/video in the Last Year: Not on file  . Ran Out of Food in the Last Year: Not on file  Transportation Needs:    . Lack of Transportation (Medical): Not on file  . Lack of Transportation (Non-Medical): Not on file  Physical Activity:   . Days of Exercise per Week: Not on file  . Minutes of Exercise per Session: Not on file  Stress:   . Feeling of Stress : Not on file  Social Connections:   . Frequency of Communication with Friends and Family: Not on file  . Frequency of Social Gatherings with Friends and Family: Not on file  . Attends Religious Services: Not on file  . Active Member of Clubs or Organizations: Not on file  . Attends Banker Meetings: Not on file  . Marital Status: Not on file  Intimate Partner Violence:   . Fear of Current or Ex-Partner: Not on file  . Emotionally Abused: Not on file  . Physically Abused: Not on file  . Sexually Abused: Not on file     PHYSICAL EXAM: Vitals:   02/05/20 0858  BP: 111/68  Pulse: 61  SpO2: 100%   General: No acute distress Head:  Normocephalic/atraumatic Skin/Extremities: No rash, no edema Neurological Exam: alert and oriented to person, place, and time. No aphasia or dysarthria. Fund of knowledge is appropriate.  Recent and remote memory are intact.  Attention and concentration are normal.   Cranial nerves: Pupils equal, round. Extraocular movements intact with no nystagmus. Visual fields full.  No facial asymmetry.  Motor: Bulk and tone normal, muscle strength 5/5 throughout with no pronator drift.   Finger to nose testing intact.  Gait narrow-based and steady, able to tandem walk adequately.    IMPRESSION: This is a 29 yo RH woman with a history of ADHD, anxiety, bipolar disorder, depression, with Juvenile Myoclonic Epilepsy. Repeat EEGs have shown generalized high voltage 4 Hz spike and polyspike and wave discharges. MRI brain normal. She had side effects on Zonisamide and on lower dose of Levetiracetam. She feels normal back on low dose Levetiracetam 250mg  BID with no significant myoclonic jerks, however she does not like  that she cannot lose weight despite exercise and diet. We discussed other options, including retrial of Lamotrigine, Topiramate, Vimpat, or Briviact, however she is scared to try a new medication at this time. Continue avoidance of seizure  triggers. She is aware of [place] driving laws to stop driving after a seizure until 6 months seizure-free. Follow-up in 4-5 months, she knows to call for any changes.   Thank you for allowing me to participate in her care.  Please do not hesitate to call for any questions or concerns.   Patrcia Dolly, M.D.   CC: Mady Gemma, PA-C

## 2020-02-05 NOTE — Patient Instructions (Addendum)
Continue Keppra 250mg  twice a day for now. Follow-up in 4-5 months, call for any changes.  Medications to consider: Re-trial of Lamictal (Lamotrigine) Topamax (Topiramate) Vimpat (Lacosamide) Briviact (Brivaracetam)   Seizure Precautions: 1. If medication has been prescribed for you to prevent seizures, take it exactly as directed.  Do not stop taking the medicine without talking to your doctor first, even if you have not had a seizure in a long time.   2. Avoid activities in which a seizure would cause danger to yourself or to others.  Don't operate dangerous machinery, swim alone, or climb in high or dangerous places, such as on ladders, roofs, or girders.  Do not drive unless your doctor says you may.  3. If you have any warning that you may have a seizure, lay down in a safe place where you can't hurt yourself.    4.  No driving for 6 months from last seizure, as per [facility].   Please refer to the following link on the Epilepsy Foundation of America's website for more information: http://www.epilepsyfoundation.org/answerplace/Social/driving/drivingu.cfm   5.  Maintain good sleep hygiene. Avoid alcohol.  6.  Notify your neurology if you are planning pregnancy or if you become pregnant.  7.  Contact your doctor if you have any problems that may be related to the medicine you are taking.  8.  Call 911 and bring the patient back to the ED if:        A.  The seizure lasts longer than 5 minutes.       B.  The patient doesn't awaken shortly after the seizure  C.  The patient has new problems such as difficulty seeing, speaking or moving  D.  The patient was injured during the seizure  E.  The patient has a temperature over 102 F (39C)  F.  The patient vomited and now is having trouble breathing

## 2020-02-11 ENCOUNTER — Other Ambulatory Visit (HOSPITAL_COMMUNITY): Payer: Self-pay | Admitting: Surgery

## 2020-02-11 ENCOUNTER — Other Ambulatory Visit: Payer: Self-pay | Admitting: Surgery

## 2020-02-11 DIAGNOSIS — R109 Unspecified abdominal pain: Secondary | ICD-10-CM

## 2020-02-17 ENCOUNTER — Other Ambulatory Visit (HOSPITAL_COMMUNITY): Payer: Self-pay | Admitting: Surgery

## 2020-02-17 ENCOUNTER — Other Ambulatory Visit: Payer: Self-pay | Admitting: Surgery

## 2020-02-17 DIAGNOSIS — R109 Unspecified abdominal pain: Secondary | ICD-10-CM

## 2020-04-29 ENCOUNTER — Other Ambulatory Visit: Payer: Self-pay

## 2020-04-29 ENCOUNTER — Encounter: Payer: Self-pay | Admitting: Physical Therapy

## 2020-04-29 ENCOUNTER — Ambulatory Visit: Payer: Medicaid Other | Attending: Surgery | Admitting: Physical Therapy

## 2020-04-29 DIAGNOSIS — M6281 Muscle weakness (generalized): Secondary | ICD-10-CM | POA: Diagnosis present

## 2020-04-29 DIAGNOSIS — R252 Cramp and spasm: Secondary | ICD-10-CM | POA: Diagnosis present

## 2020-04-29 NOTE — Therapy (Signed)
[facility] [address], [address] [place], [place], [address] Phone: [phone]   Fax:  [phone]  Physical Therapy Evaluation  Patient Details  Name: Kathy [name] MRN: [id number] Date of Birth: 12-13-1990 Referring Provider (PT): Dr. Leeroy Bock A. Fredricka Bonine   Encounter Date: 04/29/2020   PT End of Session - 04/29/20 0928    Visit Number 1    Date for PT Re-Evaluation 07/22/20    Authorization Type [employer]-medicaid    PT Start Time 0845    PT Stop Time 0925    PT Time Calculation (min) 40 min    Activity Tolerance Patient tolerated treatment well;No increased pain    Behavior During Therapy WFL for tasks assessed/performed           Past Medical History:  Diagnosis Date  . Asthma   . Bipolar 1 disorder (HCC)   . Cystitis, interstitial   . Depression    better now, situational, had pp with last child  . Dysthymic disorder   . Frequency of urination   . Headache(784.0)   . History of abnormal cervical Pap smear   . History of ovarian cyst   . History of pelvic inflammatory disease   . History of suicide attempt    06/ 2012  overdose oxycontin  . HSV-1 (herpes simplex virus 1) infection   . Nocturia   . Orthostatic hypotension   . Pelvic pain in female   . Seizures (HCC)   . Vaginal Pap smear, abnormal    ok since    Past Surgical History:  Procedure Laterality Date  . CYSTO WITH HYDRODISTENSION N/A 02/25/2014   Procedure: CYSTOSCOPY/HYDRODISTENSION WITH INSTILLATION;  Surgeon: Martina Sinner, MD;  Location: [facility];  Service: Urology;  Laterality: N/A;  . DILATION AND CURETTAGE OF UTERUS  2011   W/ SUCTION  . TONSILLECTOMY  09-10-2007    There were no vitals filed for this visit.    Subjective Assessment - 04/29/20 0850    Subjective When she exercises she gets a muscle spasm in the right lower quadrant. Started running and gets pain in the hips.Started with  the pain in 2015. She feels like the trunk is shortened on the right trunk. She will get spasms in different parts of her body due to epilepsy. Patient is a single mom with three kids.    Patient Stated Goals reduce pain understand how to manage it    Currently in Pain? Yes    Pain Score 6     Pain Location Abdomen    Pain Orientation Right    Pain Descriptors / Indicators Aching;Pressure    Pain Type Chronic pain    Pain Onset More than a month ago    Pain Frequency Intermittent    Aggravating Factors  after a day of exercise, being too full    Pain Relieving Factors massage    Multiple Pain Sites Yes    Pain Score 3    Pain Location Hip    Pain Orientation Right;Left    Pain Descriptors / Indicators Aching    Pain Type Acute pain    Pain Onset More than a month ago    Pain Frequency Intermittent    Aggravating Factors  running, rolling in bed    Pain Relieving Factors stretch              [facility] PT Assessment - 04/29/20 0001      Assessment   Medical Diagnosis  R10.9 Abdominal pain    Referring Provider (PT) Dr. Leeroy Bock A. Connor    Onset Date/Surgical Date --   2015   Prior Therapy none      Precautions   Precautions None      Restrictions   Weight Bearing Restrictions No      Balance Screen   Has the patient fallen in the past 6 months No    Has the patient had a decrease in activity level because of a fear of falling?  No    Is the patient reluctant to leave their home because of a fear of falling?  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 herld    Leisure running 1 mile per day, yoga, would like to get into weightlifing      Cognition   Overall Cognitive Status Within Functional Limits for tasks assessed      Observation/Other Assessments   Focus  on Therapeutic Outcomes (FOTO)  PFIQ-7 43pts; POPIQ-7 10 pts; CRAIQ-7 10 pts; UIQ-7 24 pts      Posture/Postural Control   Posture/Postural Control No significant limitations      ROM / Strength   AROM / PROM / Strength AROM;PROM;Strength      AROM   Lumbar - Right Rotation decreased by 25%    Lumbar - Left Rotation decreased by 25%      Strength   Overall Strength Comments abdominal strength 2/5    Right Hip ABduction 4/5    Right Hip ADduction 4/5    Left Hip ABduction 4/5    Left Hip ADduction 4/5      Palpation   SI assessment  right ilium rotated anteriorly    Palpation comment Tenderness located in the lower abdominals, diaphgram, right abdominals, right gluteus medius, left SI joint, right intercoastals from 5-12, right quadratus,  lumbar paraspinals                      Objective measurements completed on examination: See above findings.     Pelvic Floor Special Questions - 04/29/20 0001    Prior Pregnancies Yes    Number of Pregnancies 3    Number of Vaginal Deliveries 3   years 10, 8 5   Any difficulty with labor and deliveries No    Currently Sexually Active Yes   not right now   Is this Painful No    Urinary Leakage Yes    Activities that cause leaking Other    Other activities that cause leaking when having a flare up with IC, flare ups are irregular    Urinary urgency Yes    Urinary frequency every hour to hour and half    Fecal incontinence Yes   started after third child, wipe and then still left,                   PT Education - 04/29/20 0926    Education Details Access Code: [email or web address]    Person(s) Educated Patient    Methods Explanation;Demonstration;Verbal cues;Handout    Comprehension Returned demonstration;Verbalized understanding            PT Short Term Goals - 04/29/20 1308      PT SHORT TERM  GOAL #1   Title independent with initial HEP    Baseline not educated yet    Time 4    Period Weeks    Status New     Target Date 05/27/20      PT SHORT TERM GOAL #2   Title abdominal pain after exercise decreased >/= 4/10    Baseline pain level 6/10    Time 12    Period Weeks    Status New    Target Date 07/22/20      PT SHORT TERM GOAL #3   Title ----    Baseline ---    Time --    Period --    Status --    Target Date --      PT SHORT TERM GOAL #4   Title ---    Baseline ---    Time --    Period --    Status --    Target Date --             PT Long Term Goals - 04/29/20 1314      PT LONG TERM GOAL #1   Title Pt will be I with HEP for advanced HEP    Baseline not educated yet    Time 109    Period Weeks    Status New    Target Date 07/22/20      PT LONG TERM GOAL #2   Title able to run with bilateral hip and abdominal </= 1-2/10 due to improved mobility and strength    Baseline pain level for abdominal is 6/10 and hip pain is 3/10    Time 12    Period Weeks    Status New    Target Date 07/22/20      PT LONG TERM GOAL #3   Title able to expand the lower rib cage then into the abdomen to reduce the restrictions in the lower rib cage    Baseline not able to open up the lower rib cage    Time 12    Period Weeks    Status New    Target Date 07/22/20      PT LONG TERM GOAL #4   Title able to perform proper body mechnics with taking care of her children and at work to manage her pain    Baseline not educated yet    Time 12    Period Weeks    Status New    Target Date 07/22/20                  Plan - 04/29/20 1258    Clinical Impression Statement Patient is a 30 year old female with abdominal pain since 2014 when she had her child. Patient reports her abdominal pain level is 6/10 after exercise and bilateral hip pain is 3/5 with running and rolling in bed. Patient has a history of seizures with muscle spasms. Patient lumbar ROM is limited by 25%. Bilateral hip abduction and adduction is 4/5. Right ilium is rotated anteirorly. She has tenderness located in the right  abdominals, diaphgram, gluteus medius, quadratus lumborum, lumbar paraspinals and left SI joint. Patient will have urinary leakage wiht intertitial cystitis flare-up. Patient has to urinate every hour. She has tightness in the right lower rib cage. Abdominal strength is 2/5. Patient will benefit from skilled therapy to reduce her pain so she is able to exercise, take care of her children and work as a Administrator, sports.    Personal  Factors and Comorbidities Age;Fitness;Comorbidity 3+;Profession    Comorbidities interstial cystitis, seizures with muscle spasms, bipolar    Examination-Activity Limitations Sit;Lift;Continence;Reach Overhead    Examination-Participation Restrictions Meal Prep;Cleaning;Laundry    Stability/Clinical Decision Making Stable/Uncomplicated    Clinical Decision Making Low    Rehab Potential Excellent    PT Frequency 1x / week    PT Duration 12 weeks    PT Treatment/Interventions Cryotherapy;Electrical Stimulation;Iontophoresis 4mg /ml Dexamethasone;Moist Heat;Ultrasound;Neuromuscular re-education;Therapeutic exercise;Therapeutic activities;Patient/family education;Manual techniques;Dry needling;Spinal Manipulations;Joint Manipulations    PT Next Visit Plan manual work to the right abdominals and lower rib cage; engagement of the abdominals, work on thoracic lumbar junction and elongate the area    PT Home Exercise Plan Access Code: [email or web address]    Consulted and Agree with Plan of Care Patient           Patient will benefit from skilled therapeutic intervention in order to improve the following deficits and impairments:  Decreased range of motion,Increased fascial restricitons,Decreased activity tolerance,Pain,Increased muscle spasms,Decreased strength,Decreased mobility  Visit Diagnosis: Muscle weakness (generalized) - Plan: PT plan of care cert/re-cert  Cramp and spasm - Plan: PT plan of care cert/re-cert     Problem List Patient Active Problem List   Diagnosis Date  Noted  . Seizures (HCC) 03/13/2017  . Left sided numbness 03/12/2017  . Neck pain 03/12/2017  . UTI (urinary tract infection) 03/12/2017  . Bad headache   . Interstitial cystitis 03/06/2017  . Asthma 06/18/2015  . Attention deficit disorder 06/18/2015  . Bipolar 1 disorder (HCC) 06/18/2015  . Dysphagia 06/18/2015  . Esophageal reflux 06/18/2015  . Sinus bradycardia 06/18/2015  . Tension type headache 06/18/2015  . Varicose veins of legs 06/18/2015  . Pregnant 04/16/2015  . Adjustment disorder with depressed mood 10/12/2014  . Anxiety 10/12/2014  . Viral gastroenteritis 10/18/2011    10/20/2011, PT 04/29/20 1:22 PM   [facility] [address] [place], [place], [place] Phone: [phone]   Fax:  [phone]  Name: Kathy [name] MRN: [id number] Date of Birth: 06-09-1990

## 2020-04-29 NOTE — Patient Instructions (Signed)
Access Code: [email or web address] URL: [email or web address] Date: 04/29/2020 Prepared by: Eulis Foster  Exercises Supine Quadratus Lumborum Stretch - 1 x daily - 7 x weekly - 1 sets - 2 reps - 30 sec hold Half Kneeling Hip Flexor Stretch with Sidebend - 1 x daily - 7 x weekly - 1 sets - 2 reps - 30 sec hold Child's Pose with Sidebending - 1 x daily - 7 x weekly - 1 sets - 2 reps - 30 sec hold [facility] [address], [address] [place], [place] [address] Phone # [phone] Fax [phone]

## 2020-05-04 ENCOUNTER — Ambulatory Visit: Payer: Medicaid Other | Admitting: Physical Therapy

## 2020-05-04 ENCOUNTER — Encounter: Payer: Self-pay | Admitting: Physical Therapy

## 2020-05-04 ENCOUNTER — Other Ambulatory Visit: Payer: Self-pay

## 2020-05-04 DIAGNOSIS — M6281 Muscle weakness (generalized): Secondary | ICD-10-CM

## 2020-05-04 DIAGNOSIS — R252 Cramp and spasm: Secondary | ICD-10-CM

## 2020-05-04 NOTE — Patient Instructions (Signed)
Access Code: [email or web address] URL: [email or web address] Date: 05/04/2020 Prepared by: Eulis Foster  Exercises Supine Quadratus Lumborum Stretch - 1 x daily - 7 x weekly - 1 sets - 2 reps - 30 sec hold Half Kneeling Hip Flexor Stretch with Sidebend - 1 x daily - 7 x weekly - 1 sets - 2 reps - 30 sec hold Child's Pose with Sidebending - 1 x daily - 7 x weekly - 1 sets - 2 reps - 30 sec hold Sidelying IT Band Foam Roll Mobilization - 1 x daily - 7 x weekly - 1 sets - 10 reps Hip Flexor Mobilization with Foam Roll - 1 x daily - 7 x weekly - 1 sets - 10 reps Thoracic Extension Mobilization on Foam Roll - 1 x daily - 7 x weekly - 3 sets - 10 reps Thoracic Mobilization on Foam Roll - 1 x daily - 7 x weekly - 3 sets - 10 reps [facility] [address] [place], [place] [address] Phone # [phone] Fax [phone]

## 2020-05-04 NOTE — Therapy (Signed)
[facility] [address] [place], [place], [address] Phone: [phone]   Fax:  [phone]  Physical Therapy Treatment  Patient Details  Name: Kathy [name] MRN: [id number] Date of Birth: Nov 07, 1990 Referring Provider (PT): Dr. Leeroy Bock A. Fredricka Bonine   Encounter Date: 05/04/2020   PT End of Session - 05/04/20 1019    Visit Number 2    Date for PT Re-Evaluation 07/22/20    Authorization Type [employer]med[employer]    Authorization - Visit Number 2    Authorization - Number of Visits 12    PT Start Time 1019   had to go to the bathroom   PT Stop Time 1057    PT Time Calculation (min) 38 min    Activity Tolerance Patient tolerated treatment well;No increased pain    Behavior During Therapy WFL for tasks assessed/performed           Past Medical History:  Diagnosis Date  . Asthma   . Bipolar 1 disorder (HCC)   . Cystitis, interstitial   . Depression    better now, situational, had pp with last child  . Dysthymic disorder   . Frequency of urination   . Headache(784.0)   . History of abnormal cervical Pap smear   . History of ovarian cyst   . History of pelvic inflammatory disease   . History of suicide attempt    06/ 2012  overdose oxycontin  . HSV-1 (herpes simplex virus 1) infection   . Nocturia   . Orthostatic hypotension   . Pelvic pain in female   . Seizures (HCC)   . Vaginal Pap smear, abnormal    ok since    Past Surgical History:  Procedure Laterality Date  . CYSTO WITH HYDRODISTENSION N/A 02/25/2014   Procedure: CYSTOSCOPY/HYDRODISTENSION WITH INSTILLATION;  Surgeon: Martina Sinner, MD;  Location: [facility];  Service: Urology;  Laterality: N/A;  . DILATION AND CURETTAGE OF UTERUS  2011   W/ SUCTION  . TONSILLECTOMY  09-10-2007    There were no vitals filed for this visit.   Subjective Assessment - 05/04/20 1020    Subjective I felt fine after the initial  evaluation.    Patient Stated Goals reduce pain understand how to manage it    Currently in Pain? Yes    Pain Score 8     Pain Location Abdomen    Pain Orientation Right    Pain Descriptors / Indicators Aching;Pressure    Pain Type Chronic pain    Pain Onset More than a month ago    Pain Frequency Intermittent    Aggravating Factors  after a day of exercise, being too full    Pain Relieving Factors massage    Multiple Pain Sites Yes    Pain Score 2    Pain Location Hip    Pain Orientation Right;Left    Pain Descriptors / Indicators Aching    Pain Type Acute pain    Pain Onset More than a month ago    Pain Frequency Intermittent    Aggravating Factors  running, rolling in bed    Pain Relieving Factors stretch                             [facility] Adult PT Treatment/Exercise - 05/04/20 0001      Self-Care   Self-Care Other Self-Care Comments    Other Self-Care Comments  use a tennis  ball to massage the quadratus, lumbar and thoracic paraspinals, lay on stomach to massage the trigger point      Lumbar Exercises: Stretches   Hip Flexor Stretch Right;Left;1 rep;60 seconds    Hip Flexor Stretch Limitations prone on foam roller    ITB Stretch Left;Right;1 rep;30 seconds    ITB Stretch Limitations sidely on foam    Other Lumbar Stretch Exercise prone on tennis ball and massage the abdomen; lay on right side to relesae the right side    Other Lumbar Stretch Exercise supine on foam roller to massage the thoracic spine and mobilize      Manual Therapy   Manual Therapy Myofascial release;Soft tissue mobilization    Soft tissue mobilization right quadratus, diaphgram and thoracic lumbar paraspinals    Myofascial Release using the suction cup to lift up the abdominals and lumbar thoracic parapsinals to release the fascia followed by tissue rollin                  PT Education - 05/04/20 1058    Education Details Access Code: [email or web address]; how to use a suction cup  for fascial release    Person(s) Educated Patient    Methods Explanation;Demonstration;Verbal cues;Handout    Comprehension Returned demonstration;Verbalized understanding            PT Short Term Goals - 04/29/20 1308      PT SHORT TERM GOAL #1   Title independent with initial HEP    Baseline not educated yet    Time 4    Period Weeks    Status New    Target Date 05/27/20      PT SHORT TERM GOAL #2   Title abdominal pain after exercise decreased >/= 4/10    Baseline pain level 6/10    Time 12    Period Weeks    Status New    Target Date 07/22/20      PT SHORT TERM GOAL #3   Title ----    Baseline ---    Time --    Period --    Status --    Target Date --      PT SHORT TERM GOAL #4   Title ---    Baseline ---    Time --    Period --    Status --    Target Date --             PT Long Term Goals - 04/29/20 1314      PT LONG TERM GOAL #1   Title Pt will be I with HEP for advanced HEP    Baseline not educated yet    Time 44    Period Weeks    Status New    Target Date 07/22/20      PT LONG TERM GOAL #2   Title able to run with bilateral hip and abdominal </= 1-2/10 due to improved mobility and strength    Baseline pain level for abdominal is 6/10 and hip pain is 3/10    Time 12    Period Weeks    Status New    Target Date 07/22/20      PT LONG TERM GOAL #3   Title able to expand the lower rib cage then into the abdomen to reduce the restrictions in the lower rib cage    Baseline not able to open up the lower rib cage    Time 12    Period Weeks  Status New    Target Date 07/22/20      PT LONG TERM GOAL #4   Title able to perform proper body mechnics with taking care of her children and at work to manage her pain    Baseline not educated yet    Time 12    Period Weeks    Status New    Target Date 07/22/20                 Plan - 05/04/20 1018    Clinical Impression Statement Patient had less pain after today. She had trigger  points in the right abdominals, lower intercoastals on the right, quadratus, and lumbar paraspinals. Patient has gotten a home program to work with her trigger points.  Patient will be going to the gym to use a foam roller. Patient will continue to work on ways to manage her pain and improve her strength.    Personal Factors and Comorbidities Age;Fitness;Comorbidity 3+;Profession    Comorbidities interstial cystitis, seizures with muscle spasms, bipolar    Examination-Activity Limitations Sit;Lift;Continence;Reach Overhead    Examination-Participation Restrictions Meal Prep;Cleaning;Laundry    Stability/Clinical Decision Making Stable/Uncomplicated    Rehab Potential Excellent    PT Frequency 1x / week    PT Duration 12 weeks    PT Treatment/Interventions Cryotherapy;Electrical Stimulation;Iontophoresis 4mg /ml Dexamethasone;Moist Heat;Ultrasound;Neuromuscular re-education;Therapeutic exercise;Therapeutic activities;Patient/family education;Manual techniques;Dry needling;Spinal Manipulations;Joint Manipulations    PT Next Visit Plan manual work to the right abdominals and lower rib cage; engagement of the abdominals, work on thoracic lumbar junction and elongate the area    PT Home Exercise Plan Access Code:    Recommended Other Services Md signed initial eval    Consulted and Agree with Plan of Care Patient           Patient will benefit from skilled therapeutic intervention in order to improve the following deficits and impairments:  Decreased range of motion,Increased fascial restricitons,Decreased activity tolerance,Pain,Increased muscle spasms,Decreased strength,Decreased mobility  Visit Diagnosis: Muscle weakness (generalized)  Cramp and spasm     Problem List Patient Active Problem List   Diagnosis Date Noted  . Seizures (HCC) 03/13/2017  . Left sided numbness 03/12/2017  . Neck pain 03/12/2017  . UTI (urinary tract infection) 03/12/2017  . Bad headache   .  Interstitial cystitis 03/06/2017  . Asthma 06/18/2015  . Attention deficit disorder 06/18/2015  . Bipolar 1 disorder (HCC) 06/18/2015  . Dysphagia 06/18/2015  . Esophageal reflux 06/18/2015  . Sinus bradycardia 06/18/2015  . Tension type headache 06/18/2015  . Varicose veins of legs 06/18/2015  . Pregnant 04/16/2015  . Adjustment disorder with depressed mood 10/12/2014  . Anxiety 10/12/2014  . Viral gastroenteritis 10/18/2011    10/20/2011, PT 05/04/20 11:01 AM    [facility] [address], [address] [place], [place], [place] Phone: [phone]   Fax:  [phone]  Name: Kathy [name] MRN: Jonna Clark Date of Birth: [birth year]

## 2020-05-25 ENCOUNTER — Other Ambulatory Visit: Payer: Self-pay

## 2020-05-25 ENCOUNTER — Encounter: Payer: Self-pay | Admitting: Physical Therapy

## 2020-05-25 ENCOUNTER — Ambulatory Visit: Payer: Medicaid Other | Admitting: Physical Therapy

## 2020-05-25 DIAGNOSIS — M6281 Muscle weakness (generalized): Secondary | ICD-10-CM

## 2020-05-25 DIAGNOSIS — R252 Cramp and spasm: Secondary | ICD-10-CM

## 2020-05-25 NOTE — Patient Instructions (Signed)
Access Code: [email or web address] URL: [email or web address] Date: 05/25/2020 Prepared by: Eulis Foster  Exercises Seated Piriformis Stretch with Trunk Bend - 1 x daily - 7 x weekly - 1 sets - 2 reps - 30 sec hold Supine Hamstring Stretch with Strap - 1 x daily - 7 x weekly - 1 sets - 2 reps - 30 sec hold Supine ITB Stretch with Strap - 1 x daily - 7 x weekly - 1 sets - 2 reps - 30 sec hold Supine Pelvic Floor Stretch - 1 x daily - 7 x weekly - 1 sets - 1 reps - 1 min hold V Sit Hip Adductor Hamstring Stretch - 1 x daily - 7 x weekly - 1 sets - 2 reps - 30 sec hold Child's Pose Stretch - 1 x daily - 7 x weekly - 1 sets - 2 reps - 30 sec hold Supine Piriformis Stretch Pulling Heel to Hip - 1 x daily - 7 x weekly - 1 sets - 2 reps - 30 sec hold Supine Hamstring Stretch - 1 x daily - 7 x weekly - 1 sets - 2 reps - 30 sec hold Supine Pelvic Floor Stretch - 1 x daily - 7 x weekly - 3 sets - 10 reps Quadruped Full Range Thoracic Rotation with Reach - 1 x daily - 7 x weekly - 1 sets - 10 reps Hooklying Isometric Hip Flexion - 1 x daily - 7 x weekly - 1 sets - 15 reps Standing Hip Flexion March - 1 x daily - 7 x weekly - 1 sets - 15 reps Seated Quadratus Lumborum Stretch with Arm Overhead - 1 x daily - 7 x weekly - 1 sets - 2 reps - 30 sec hold Sidelying IT Band Foam Roll Mobilization - 1 x daily - 7 x weekly - 1 sets - 10 reps Piriformis Mobilization on Foam Roll - 1 x daily - 7 x weekly - 1 sets - 10 reps  Patient Education Trigger [facility] Dry Needling [facility] [address], [address] [place], [place] [address] Phone # [phone] Fax [phone]

## 2020-05-25 NOTE — Therapy (Signed)
[facility] [address] [place], [place], [address] Phone: [phone]   Fax:  [phone]  Physical Therapy Treatment  Patient Details  Name: Kathy [name] MRN: [id number] Date of Birth: 03-02-1991 Referring Provider (PT): Dr. Leeroy Bock A. Fredricka Bonine   Encounter Date: 05/25/2020   PT End of Session - 05/25/20 1106    Visit Number 3    Date for PT Re-Evaluation 07/22/20    Authorization Type [employer]med[employer]    Authorization - Visit Number 3    Authorization - Number of Visits 12    PT Start Time 1015    PT Stop Time 1100    PT Time Calculation (min) 45 min    Activity Tolerance Patient tolerated treatment well;No increased pain    Behavior During Therapy WFL for tasks assessed/performed           Past Medical History:  Diagnosis Date  . Asthma   . Bipolar 1 disorder (HCC)   . Cystitis, interstitial   . Depression    better now, situational, had pp with last child  . Dysthymic disorder   . Frequency of urination   . Headache(784.0)   . History of abnormal cervical Pap smear   . History of ovarian cyst   . History of pelvic inflammatory disease   . History of suicide attempt    06/ 2012  overdose oxycontin  . HSV-1 (herpes simplex virus 1) infection   . Nocturia   . Orthostatic hypotension   . Pelvic pain in female   . Seizures (HCC)   . Vaginal Pap smear, abnormal    ok since    Past Surgical History:  Procedure Laterality Date  . CYSTO WITH HYDRODISTENSION N/A 02/25/2014   Procedure: CYSTOSCOPY/HYDRODISTENSION WITH INSTILLATION;  Surgeon: Martina Sinner, MD;  Location: [facility];  Service: Urology;  Laterality: N/A;  . DILATION AND CURETTAGE OF UTERUS  2011   W/ SUCTION  . TONSILLECTOMY  09-10-2007    There were no vitals filed for this visit.   Subjective Assessment - 05/25/20 1020    Subjective I had a wierd injury. I have been exercising more. I  feel better now.    Patient Stated Goals reduce pain understand how to manage it    Currently in Pain? Yes    Pain Score 3     Pain Location Abdomen    Pain Orientation Right    Pain Descriptors / Indicators Aching;Pressure;Tightness    Pain Type Chronic pain    Pain Onset More than a month ago    Pain Frequency Intermittent    Aggravating Factors  after a day of exercise, being too full    Pain Relieving Factors massage    Pain Score 6    Pain Location Hip    Pain Orientation Right;Left    Pain Descriptors / Indicators Aching    Pain Type Acute pain    Pain Onset More than a month ago    Pain Frequency Intermittent    Aggravating Factors  running, rolling bed    Pain Relieving Factors stretch                             [facility] Adult PT Treatment/Exercise - 05/25/20 0001      Lumbar Exercises: Stretches   ITB Stretch Left;Right;1 rep;60 seconds    ITB Stretch Limitations foam roll    Piriformis Stretch Right;Left;1 rep;60  seconds    Piriformis Stretch Limitations foam roll      Lumbar Exercises: Aerobic   UBE (Upper Arm Bike) 3 minutes forward and 3 minutes backward level 2      Lumbar Exercises: Standing   Other Standing Lumbar Exercises stand with side against the wall, wall arm strethed in front of the wall and slide the wall leg up and down engaging the abdominals 10x each leg      Lumbar Exercises: Supine   Other Supine Lumbar Exercises supine hip isometric and moving the opposite hip and arm flexion 15x each side working the abdominals      Lumbar Exercises: Sidelying   Other Sidelying Lumbar Exercises lay on left side with foam roll under the left rib cage with rotation of the trunk and therapist manually opening up the rib cage and using breath to stretch out the area      Lumbar Exercises: Quadruped   Other Quadruped Lumbar Exercises quadruped thoracic rotation reaching upward then going into thread the needle pose 10x each side                   PT Education - 05/25/20 1101    Education Details Access Code: [email or web address]    Person(s) Educated Patient    Methods Explanation;Demonstration;Verbal cues;Handout    Comprehension Returned demonstration;Verbalized understanding            PT Short Term Goals - 05/25/20 1115      PT SHORT TERM GOAL #1   Title independent with initial HEP    Time 4    Period Weeks    Status Achieved      PT SHORT TERM GOAL #2   Title abdominal pain after exercise decreased >/= 4/10    Baseline pain level 3/10    Time 12    Period Weeks    Status Achieved             PT Long Term Goals - 04/29/20 1314      PT LONG TERM GOAL #1   Title Pt will be I with HEP for advanced HEP    Baseline not educated yet    Time 12    Period Weeks    Status New    Target Date 07/22/20      PT LONG TERM GOAL #2   Title able to run with bilateral hip and abdominal </= 1-2/10 due to improved mobility and strength    Baseline pain level for abdominal is 6/10 and hip pain is 3/10    Time 12    Period Weeks    Status New    Target Date 07/22/20      PT LONG TERM GOAL #3   Title able to expand the lower rib cage then into the abdomen to reduce the restrictions in the lower rib cage    Baseline not able to open up the lower rib cage    Time 12    Period Weeks    Status New    Target Date 07/22/20      PT LONG TERM GOAL #4   Title able to perform proper body mechnics with taking care of her children and at work to manage her pain    Baseline not educated yet    Time 12    Period Weeks    Status New    Target Date 07/22/20  Plan - 05/25/20 1019    Clinical Impression Statement After therapy, patient was able to open up the right trunk. Patient has increased opening of her right lower rib cage. She is able to engage her lower abdominals with greater ease and is not being challenged with arm and leg movement at the same time. Patient pain has decreased from 8/10 to  3/10. Patient is having hip pain but foam rolling has been helping. Patient will benefit from skilled therapy to work on [employer] and improve her strength.    Personal Factors and Comorbidities Age;Fitness;Comorbidity 3+;Profession    Comorbidities interstial cystitis, seizures with muscle spasms, bipolar    Examination-Activity Limitations Sit;Lift;Continence;Reach Overhead    Examination-Participation Restrictions Meal Prep;Cleaning;Laundry    Stability/Clinical Decision Making Stable/Uncomplicated    Rehab Potential Excellent    PT Frequency 1x / week    PT Duration 12 weeks    PT Treatment/Interventions Cryotherapy;Electrical Stimulation;Iontophoresis 4mg /ml Dexamethasone;Moist Heat;Ultrasound;Neuromuscular re-education;Therapeutic exercise;Therapeutic activities;Patient/family education;Manual techniques;Dry needling;Spinal Manipulations;Joint Manipulations    PT Next Visit Plan manual work to the right abdominals, work on joint and check alignment and of the pelvis    PT Home Exercise Plan Access Code: [email or web address]    Consulted and Agree with Plan of Care Patient           Patient will benefit from skilled therapeutic intervention in order to improve the following deficits and impairments:  Decreased range of motion,Increased fascial restricitons,Decreased activity tolerance,Pain,Increased muscle spasms,Decreased strength,Decreased mobility  Visit Diagnosis: Muscle weakness (generalized)  Cramp and spasm     Problem List Patient Active Problem List   Diagnosis Date Noted  . Seizures (HCC) 03/13/2017  . Left sided numbness 03/12/2017  . Neck pain 03/12/2017  . UTI (urinary tract infection) 03/12/2017  . Bad headache   . Interstitial cystitis 03/06/2017  . Asthma 06/18/2015  . Attention deficit disorder 06/18/2015  . Bipolar 1 disorder (HCC) 06/18/2015  . Dysphagia 06/18/2015  . Esophageal reflux 06/18/2015  . Sinus bradycardia 06/18/2015  . Tension type headache  06/18/2015  . Varicose veins of legs 06/18/2015  . Pregnant 04/16/2015  . Adjustment disorder with depressed mood 10/12/2014  . Anxiety 10/12/2014  . Viral gastroenteritis 10/18/2011    10/20/2011, PT 05/25/20 11:16 AM   [facility] [address] [place], [place], [place] Phone: [phone]   Fax:  [phone]  Name: Lorin Hauck Grigorian MRN: Jonna Clark Date of Birth: September 10, 1990

## 2020-06-03 ENCOUNTER — Ambulatory Visit: Payer: Medicaid Other | Attending: Surgery | Admitting: Physical Therapy

## 2020-06-03 ENCOUNTER — Other Ambulatory Visit: Payer: Self-pay

## 2020-06-03 ENCOUNTER — Encounter: Payer: Self-pay | Admitting: Physical Therapy

## 2020-06-03 DIAGNOSIS — M6281 Muscle weakness (generalized): Secondary | ICD-10-CM | POA: Insufficient documentation

## 2020-06-03 DIAGNOSIS — R252 Cramp and spasm: Secondary | ICD-10-CM | POA: Insufficient documentation

## 2020-06-03 NOTE — Patient Instructions (Addendum)

## 2020-06-03 NOTE — Therapy (Signed)
[facility] [address], [address] [place], [place], [address] Phone: [phone]   Fax:  [phone]  Physical Therapy Treatment  Patient Details  Name: Kathy [name] MRN: [id number] Date of Birth: 08/21/1990 Referring Provider (PT): Dr. Leeroy Bock A. Fredricka Bonine   Encounter Date: 06/03/2020   PT End of Session - 06/03/20 0942    Visit Number 4    Date for PT Re-Evaluation 07/22/20    Authorization Type [employer]med[employer]    Authorization - Visit Number 4    Authorization - Number of Visits 12    PT Start Time 0930    PT Stop Time 1002    PT Time Calculation (min) 32 min    Activity Tolerance Patient tolerated treatment well;No increased pain    Behavior During Therapy WFL for tasks assessed/performed           Past Medical History:  Diagnosis Date  . Asthma   . Bipolar 1 disorder (HCC)   . Cystitis, interstitial   . Depression    better now, situational, had pp with last child  . Dysthymic disorder   . Frequency of urination   . Headache(784.0)   . History of abnormal cervical Pap smear   . History of ovarian cyst   . History of pelvic inflammatory disease   . History of suicide attempt    06/ 2012  overdose oxycontin  . HSV-1 (herpes simplex virus 1) infection   . Nocturia   . Orthostatic hypotension   . Pelvic pain in female   . Seizures (HCC)   . Vaginal Pap smear, abnormal    ok since    Past Surgical History:  Procedure Laterality Date  . CYSTO WITH HYDRODISTENSION N/A 02/25/2014   Procedure: CYSTOSCOPY/HYDRODISTENSION WITH INSTILLATION;  Surgeon: Martina Sinner, MD;  Location: [facility];  Service: Urology;  Laterality: N/A;  . DILATION AND CURETTAGE OF UTERUS  2011   W/ SUCTION  . TONSILLECTOMY  09-10-2007    There were no vitals filed for this visit.   Subjective Assessment - 06/03/20 0932    Subjective I have a foam roller for the house and using it. I have  started [employer]. The pain just comes on randomly. I am having the lightening stabbing ache and not the dull ache. Feels like a muscle spasm ache.    Patient Stated Goals reduce pain understand how to manage it    Pain Score 8     Pain Location Abdomen    Pain Orientation Right    Pain Descriptors / Indicators Aching;Stabbing    Pain Type Chronic pain    Pain Onset More than a month ago    Pain Frequency Intermittent    Aggravating Factors  after a day of exercise, being too full    Pain Relieving Factors massage    Pain Score 4    Pain Location Hip    Pain Orientation Right;Left    Pain Descriptors / Indicators Aching;Dull    Pain Type Acute pain    Pain Onset More than a month ago    Pain Frequency Intermittent    Aggravating Factors  running, rolling bed    Pain Relieving Factors stretch              [facility] PT Assessment - 06/03/20 0001      Palpation   SI assessment  pelvis in correct alignment  [facility] Adult PT Treatment/Exercise - 06/03/20 0001      Lumbar Exercises: Aerobic   UBE (Upper Arm Bike) 3 minutes forward and 3 minutes backward level 2      Manual Therapy   Manual Therapy Soft tissue mobilization    Manual therapy comments to assess tissue with dry needling    Soft tissue mobilization along the rectus abdominus and obliques to elongate the tissue after dry needling            Trigger Point Dry Needling - 06/03/20 0001    Consent Given? Yes    Education Handout Provided Yes    Dry Needling Comments to right rectus abdominus   trigger point release; elongation of muscle               PT Education - 06/03/20 1003    Education Details education on dry needling    Person(s) Educated Patient    Methods Explanation;Handout    Comprehension Verbalized understanding            PT Short Term Goals - 05/25/20 1115      PT SHORT TERM GOAL #1   Title independent with initial HEP    Time 4     Period Weeks    Status Achieved      PT SHORT TERM GOAL #2   Title abdominal pain after exercise decreased >/= 4/10    Baseline pain level 3/10    Time 12    Period Weeks    Status Achieved             PT Long Term Goals - 06/03/20 1010      PT LONG TERM GOAL #1   Title Pt will be I with HEP for advanced HEP    Baseline not educated yet    Period Weeks    Status On-going      PT LONG TERM GOAL #2   Title able to run with bilateral hip and abdominal </= 1-2/10 due to improved mobility and strength    Baseline pain level for abdominal is 6/10 and hip pain is 3/10    Time 12    Period Weeks    Status On-going      PT LONG TERM GOAL #3   Title able to expand the lower rib cage then into the abdomen to reduce the restrictions in the lower rib cage    Baseline not able to open up the lower rib cage    Time 12    Period Weeks    Status On-going                 Plan - 06/03/20 0941    Clinical Impression Statement Today her pain was 4/10 instead of 6/10. Pelvis is in correct alignment. Patient has gotten a foam roll to work on her muscles at home. Patient had release of the right rectus abdominus after the dry needling and did not have the know located in the muscle. Patient pain is now not as constant. Patient will get a sharp pain sporadically without reason. Patient is now doing [employer] for strengthening. Patient will benefit from skilled therapy to work on pain management and improve her strength.    Personal Factors and Comorbidities Age;Fitness;Comorbidity 3+;Profession    Comorbidities interstial cystitis, seizures with muscle spasms, bipolar    Examination-Activity Limitations Sit;Lift;Continence;Reach Overhead    Examination-Participation Restrictions Meal Prep;Cleaning;Laundry    Stability/Clinical Decision Making Stable/Uncomplicated    Rehab Potential Excellent  PT Frequency 1x / week    PT Duration 12 weeks    PT Treatment/Interventions  Cryotherapy;Electrical Stimulation;Iontophoresis 4mg /ml Dexamethasone;Moist Heat;Ultrasound;Neuromuscular re-education;Therapeutic exercise;Therapeutic activities;Patient/family education;Manual techniques;Dry needling;Spinal Manipulations;Joint Manipulations    PT Next Visit Plan see how she did with the dry needling; manual work to abdominal; check on lower rib cage expansion    PT Home Exercise Plan Access Code: [email or web address]    Consulted and Agree with Plan of Care Patient           Patient will benefit from skilled therapeutic intervention in order to improve the following deficits and impairments:  Decreased range of motion,Increased fascial restricitons,Decreased activity tolerance,Pain,Increased muscle spasms,Decreased strength,Decreased mobility  Visit Diagnosis: Muscle weakness (generalized)  Cramp and spasm     Problem List Patient Active Problem List   Diagnosis Date Noted  . Seizures (HCC) 03/13/2017  . Left sided numbness 03/12/2017  . Neck pain 03/12/2017  . UTI (urinary tract infection) 03/12/2017  . Bad headache   . Interstitial cystitis 03/06/2017  . Asthma 06/18/2015  . Attention deficit disorder 06/18/2015  . Bipolar 1 disorder (HCC) 06/18/2015  . Dysphagia 06/18/2015  . Esophageal reflux 06/18/2015  . Sinus bradycardia 06/18/2015  . Tension type headache 06/18/2015  . Varicose veins of legs 06/18/2015  . Pregnant 04/16/2015  . Adjustment disorder with depressed mood 10/12/2014  . Anxiety 10/12/2014  . Viral gastroenteritis 10/18/2011    10/20/2011, PT 06/03/20 10:12 AM   [facility] [address] [place], [place], [place] Phone: [phone]   Fax:  [phone]  Name: Kathy [name] MRN: Jonna Clark Date of Birth: 12-19-1990

## 2020-06-08 ENCOUNTER — Other Ambulatory Visit: Payer: Self-pay

## 2020-06-08 ENCOUNTER — Encounter: Payer: Self-pay | Admitting: Physical Therapy

## 2020-06-08 ENCOUNTER — Ambulatory Visit: Payer: Medicaid Other | Admitting: Physical Therapy

## 2020-06-08 DIAGNOSIS — R252 Cramp and spasm: Secondary | ICD-10-CM

## 2020-06-08 DIAGNOSIS — M6281 Muscle weakness (generalized): Secondary | ICD-10-CM

## 2020-06-08 NOTE — Patient Instructions (Signed)
Access Code: [email or web address] URL: [email or web address] Date: 06/08/2020 Prepared by: Eulis Foster  Exercises Supine Quadratus Lumborum Stretch - 1 x daily - 7 x weekly - 1 sets - 2 reps - 30 sec hold Half Kneeling Hip Flexor Stretch with Sidebend - 1 x daily - 7 x weekly - 1 sets - 2 reps - 30 sec hold Child's Pose with Sidebending - 1 x daily - 7 x weekly - 1 sets - 2 reps - 30 sec hold Sidelying IT Band Foam Roll Mobilization - 1 x daily - 7 x weekly - 1 sets - 10 reps Hip Flexor Mobilization with Foam Roll - 1 x daily - 7 x weekly - 1 sets - 10 reps Thoracic Extension Mobilization on Foam Roll - 1 x daily - 7 x weekly - 3 sets - 10 reps Thoracic Mobilization on Foam Roll - 1 x daily - 7 x weekly - 3 sets - 10 reps Seated Pelvic Floor Contraction - 3 x daily - 7 x weekly - 1 sets - 5 reps - 5 hold [facility] [address], [address] [place], [place] [address] Phone # [phone] Fax [phone]

## 2020-06-08 NOTE — Therapy (Signed)
[facility] [address], [address] [place], [place], [address] Phone: [phone]   Fax:  [phone]  Physical Therapy Treatment  Patient Details  Name: Kathy [name] MRN: [id number] Date of Birth: 30-Aug-1990 Referring Provider (PT): Dr. Leeroy Bock A. Fredricka Bonine   Encounter Date: 06/08/2020   PT End of Session - 06/08/20 1013    Visit Number 5    Date for PT Re-Evaluation 07/22/20    Authorization Type [employer]med[employer]    Authorization - Visit Number 5    PT Start Time 0930    PT Stop Time 1008    PT Time Calculation (min) 38 min    Activity Tolerance Patient tolerated treatment well;No increased pain    Behavior During Therapy WFL for tasks assessed/performed           Past Medical History:  Diagnosis Date  . Asthma   . Bipolar 1 disorder (HCC)   . Cystitis, interstitial   . Depression    better now, situational, had pp with last child  . Dysthymic disorder   . Frequency of urination   . Headache(784.0)   . History of abnormal cervical Pap smear   . History of ovarian cyst   . History of pelvic inflammatory disease   . History of suicide attempt    06/ 2012  overdose oxycontin  . HSV-1 (herpes simplex virus 1) infection   . Nocturia   . Orthostatic hypotension   . Pelvic pain in female   . Seizures (HCC)   . Vaginal Pap smear, abnormal    ok since    Past Surgical History:  Procedure Laterality Date  . CYSTO WITH HYDRODISTENSION N/A 02/25/2014   Procedure: CYSTOSCOPY/HYDRODISTENSION WITH INSTILLATION;  Surgeon: Martina Sinner, MD;  Location: [facility];  Service: Urology;  Laterality: N/A;  . DILATION AND CURETTAGE OF UTERUS  2011   W/ SUCTION  . TONSILLECTOMY  09-10-2007    There were no vitals filed for this visit.   Subjective Assessment - 06/08/20 0929    Subjective I have not had the pain since last visit.    Patient Stated Goals reduce pain understand how  to manage it    Currently in Pain? No/denies    Multiple Pain Sites No              [facility] PT Assessment - 06/08/20 0001      Assessment   Medical Diagnosis R10.9 Abdominal pain    Referring Provider (PT) Dr. Leeroy Bock A. Connor      Precautions   Precautions None      Restrictions   Weight Bearing Restrictions No      Prior Function   Level of Independence Independent      Cognition   Overall Cognitive Status Within Functional Limits for tasks assessed      Posture/Postural Control   Posture/Postural Control No significant limitations      AROM   Lumbar - Right Rotation full    Lumbar - Left Rotation full      Strength   Right Hip ABduction 5/5    Right Hip ADduction 5/5    Left Hip ABduction 5/5    Left Hip ADduction 5/5      Palpation   SI assessment  pelvis in correct alignment                      Pelvic Floor Special Questions - 06/08/20 0001  Urinary Leakage Yes    Activities that cause leaking Running;Lifting    Pelvic Floor Internal Exam Patient confirms identification and approves PT to assess pelvic floor and treatment    Exam Type Vaginal    Sensation decreased sensation with palpation    Palpation tightness in the puborectalis and pubovaginalis    Strength fair squeeze, definite lift             [facility] Adult PT Treatment/Exercise - 06/08/20 0001      Lumbar Exercises: Aerobic   UBE (Upper Arm Bike) 3 minutes forward and 3 minutes backward level 2      Lumbar Exercises: Supine   Ab Set 10 reps;5 seconds    Other Supine Lumbar Exercises pelvic floor contraction holding 5 seconds 10x with tactile cues vaginally and verbal cues to not hold her breath      Manual Therapy   Manual Therapy Soft tissue mobilization;Myofascial release    Manual therapy comments instructed patient on how to manually perform tissue work to the vaginal tissue to improve blood flow    Soft tissue mobilization manual work to the right diaphram and rectus     Myofascial Release fascial release along the right abdominal area going through the areas of the restrictions                  PT Education - 06/08/20 1012    Education Details Access Code: [email or web address]; how to perform manual work to the pelvic floor tissue    Person(s) Educated Patient    Methods Explanation;Demonstration;Verbal cues;Handout    Comprehension Returned demonstration;Verbalized understanding            PT Short Term Goals - 05/25/20 1115      PT SHORT TERM GOAL #1   Title independent with initial HEP    Time 4    Period Weeks    Status Achieved      PT SHORT TERM GOAL #2   Title abdominal pain after exercise decreased >/= 4/10    Baseline pain level 3/10    Time 12    Period Weeks    Status Achieved             PT Long Term Goals - 06/08/20 1016      PT LONG TERM GOAL #2   Title able to run with bilateral hip and abdominal </= 1-2/10 due to improved mobility and strength    Baseline pain level for abdominal is 6/10 and hip pain is 3/10    Period Weeks    Status On-going      PT LONG TERM GOAL #3   Title able to expand the lower rib cage then into the abdomen to reduce the restrictions in the lower rib cage    Baseline not able to open up the lower rib cage    Time 12    Period Weeks    Status Achieved                 Plan - 06/08/20 1013    Clinical Impression Statement Patient able to expand her lower rib cage after the restrictions were released. Patient has not had abdominal pain since last week. She still has some fascial restrictions in the right abdomial wall. Pelvic floor strength was 2/5 but after manual work increased to 3/5. Patient will leak urine with running and lifting. She also has fecal lieakage so she always has stool on the tissue when she wipes. Patient is going  to [facility] to exercise. Patient will benefit from skilled therapy to reduce pain and improve strength.    Personal Factors and Comorbidities  Age;Fitness;Comorbidity 3+;Profession    Comorbidities interstial cystitis, seizures with muscle spasms, bipolar    Examination-Activity Limitations Sit;Lift;Continence;Reach Overhead    Examination-Participation Restrictions Meal Prep;Cleaning;Laundry    Stability/Clinical Decision Making Stable/Uncomplicated    Rehab Potential Excellent    PT Frequency 1x / week    PT Duration 12 weeks    PT Treatment/Interventions Cryotherapy;Electrical Stimulation;Iontophoresis 4mg /ml Dexamethasone;Moist Heat;Ultrasound;Neuromuscular re-education;Therapeutic exercise;Therapeutic activities;Patient/family education;Manual techniques;Dry needling;Spinal Manipulations;Joint Manipulations    PT Next Visit Plan see about leakage; see if she is contracting her pelvic floor with her core exercise; body mechanics with lifting and not leaking urine; if doing well then discharge    PT Home Exercise Plan Access Code: [email or web address]    Consulted and Agree with Plan of Care Patient           Patient will benefit from skilled therapeutic intervention in order to improve the following deficits and impairments:  Decreased range of motion,Increased fascial restricitons,Decreased activity tolerance,Pain,Increased muscle spasms,Decreased strength,Decreased mobility  Visit Diagnosis: Muscle weakness (generalized)  Cramp and spasm     Problem List Patient Active Problem List   Diagnosis Date Noted  . Seizures (HCC) 03/13/2017  . Left sided numbness 03/12/2017  . Neck pain 03/12/2017  . UTI (urinary tract infection) 03/12/2017  . Bad headache   . Interstitial cystitis 03/06/2017  . Asthma 06/18/2015  . Attention deficit disorder 06/18/2015  . Bipolar 1 disorder (HCC) 06/18/2015  . Dysphagia 06/18/2015  . Esophageal reflux 06/18/2015  . Sinus bradycardia 06/18/2015  . Tension type headache 06/18/2015  . Varicose veins of legs 06/18/2015  . Pregnant 04/16/2015  . Adjustment disorder with depressed mood 10/12/2014   . Anxiety 10/12/2014  . Viral gastroenteritis 10/18/2011    10/20/2011, PT 06/08/20 10:18 AM   [facility] [address], [address] [place], [place], [place] Phone: [phone]   Fax:  [phone]  Name: Kathy [name] MRN: Jonna Clark Date of Birth: 1990/12/04

## 2020-06-15 ENCOUNTER — Ambulatory Visit: Payer: Medicaid Other | Admitting: Physical Therapy

## 2020-06-15 ENCOUNTER — Other Ambulatory Visit: Payer: Self-pay

## 2020-06-15 ENCOUNTER — Encounter: Payer: Self-pay | Admitting: Physical Therapy

## 2020-06-15 DIAGNOSIS — M6281 Muscle weakness (generalized): Secondary | ICD-10-CM

## 2020-06-15 DIAGNOSIS — R252 Cramp and spasm: Secondary | ICD-10-CM

## 2020-06-15 NOTE — Patient Instructions (Signed)
Access Code: [email or web address] URL: [email or web address] Date: 06/15/2020 Prepared by: Eulis Foster  Exercises Supine Quadratus Lumborum Stretch - 1 x daily - 7 x weekly - 1 sets - 2 reps - 30 sec hold Half Kneeling Hip Flexor Stretch with Sidebend - 1 x daily - 7 x weekly - 1 sets - 2 reps - 30 sec hold Child's Pose with Sidebending - 1 x daily - 7 x weekly - 1 sets - 2 reps - 30 sec hold Sidelying IT Band Foam Roll Mobilization - 1 x daily - 7 x weekly - 1 sets - 10 reps Hip Flexor Mobilization with Foam Roll - 1 x daily - 7 x weekly - 1 sets - 10 reps Thoracic Extension Mobilization on Foam Roll - 1 x daily - 7 x weekly - 3 sets - 10 reps Thoracic Mobilization on Foam Roll - 1 x daily - 7 x weekly - 3 sets - 10 reps Seated Pelvic Floor Contraction - 3 x daily - 7 x weekly - 1 sets - 5 reps - 5 hold Hooklying Isometric Hip Flexion - 1 x daily - 7 x weekly - 1 sets - 10 reps Quadruped Full Range Thoracic Rotation with Reach - 1 x daily - 7 x weekly - 2 sets - 10 reps Standard Plank - 1 x daily - 7 x weekly - 1 sets - 5 reps - 15 sec hold [facility] [address], [address] [place], [place] [address] Phone # [phone] Fax [phone]

## 2020-06-15 NOTE — Therapy (Addendum)
[facility] [address] [place], [place], [address] Phone: [phone]   Fax:  [phone]  Physical Therapy Treatment  Patient Details  Name: Kathy [name] MRN: [id number] Date of Birth: 11-Apr-1990 Referring Provider (PT): Dr. Vikki Ports A. Kae Heller   Encounter Date: 06/15/2020   PT End of Session - 06/15/20 1015    Visit Number 6    Date for PT Re-Evaluation 07/22/20    Authorization Type [employer]med[employer]    Authorization - Visit Number 6    Authorization - Number of Visits 12    PT Start Time 6720    PT Stop Time 1053    PT Time Calculation (min) 38 min    Activity Tolerance Patient tolerated treatment well;No increased pain    Behavior During Therapy WFL for tasks assessed/performed           Past Medical History:  Diagnosis Date  . Asthma   . Bipolar 1 disorder ([place])   . Cystitis, interstitial   . Depression    better now, situational, had pp with last child  . Dysthymic disorder   . Frequency of urination   . Headache(784.0)   . History of abnormal cervical Pap smear   . History of ovarian cyst   . History of pelvic inflammatory disease   . History of suicide attempt    06/ 2012  overdose oxycontin  . HSV-1 (herpes simplex virus 1) infection   . Nocturia   . Orthostatic hypotension   . Pelvic pain in female   . Seizures ([place])   . Vaginal Pap smear, abnormal    ok since    Past Surgical History:  Procedure Laterality Date  . CYSTO WITH HYDRODISTENSION N/A 02/25/2014   Procedure: CYSTOSCOPY/HYDRODISTENSION WITH INSTILLATION;  Surgeon: Reece Packer, MD;  Location: [facility];  Service: Urology;  Laterality: N/A;  . DILATION AND CURETTAGE OF UTERUS  2011   W/ SUCTION  . TONSILLECTOMY  09-10-2007    There were no vitals filed for this visit.   Subjective Assessment - 06/15/20 1016    Subjective I have had alot of pelvic pain and cycle was late.  Yesterday my side pain started again. I have a colopscopy tomorrow to look at my cervix. I have not been to [facility] since Tuesday. Still have urinary leakage daily. When I get a flare-up from her IC. Leak urine with sneeze.    Patient Stated Goals reduce pain understand how to manage it    Currently in Pain? Yes    Pain Score 4     Pain Location Abdomen    Pain Orientation Right    Pain Descriptors / Indicators Aching;Dull    Pain Type Chronic pain    Pain Onset More than a month ago    Pain Frequency Intermittent    Aggravating Factors  after a day of exercise, being too full, around her cycle    Pain Relieving Factors massage    Multiple Pain Sites Yes    Pain Score 8    Pain Location Hip    Pain Orientation Left    Pain Descriptors / Indicators Aching;Constant    Pain Type Acute pain    Pain Onset More than a month ago    Pain Frequency Constant    Aggravating Factors  running, rolling in bed    Pain Relieving Factors stretch              [facility] PT  Assessment - 06/15/20 0001      Palpation   SI assessment  pelvis in correct alignment                      Pelvic Floor Special Questions - 06/15/20 0001    Urinary Leakage Yes    Activities that cause leaking Running;Lifting             [facility] Adult PT Treatment/Exercise - 06/15/20 0001      Lumbar Exercises: Aerobic   UBE (Upper Arm Bike) 3 minutes forward and 3 minutes backward level 2      Lumbar Exercises: Supine   Isometric Hip Flexion 10 reps;1 second    Isometric Hip Flexion Limitations resist one hip and move the other arm and leg up and down      Lumbar Exercises: Sidelying   Other Sidelying Lumbar Exercises iliacus pull back 15 times each side with tactile cues to bring hip back and down      Lumbar Exercises: Prone   Other Prone Lumbar Exercises Plank with ball squeeze hold 15 seconds      Lumbar Exercises: Quadruped   Other Quadruped Lumbar Exercises thoracic rotation with ball  squeeze and pelvic floor contraction using the yellow band in hands for resistance 15x each way      Manual Therapy   Manual Therapy Soft tissue mobilization;Joint mobilization    Joint Mobilization gapping of the left T10-L5 in right sidely    Soft tissue mobilization using the [place]aday to the left hip, gluteal, quadratus                  PT Education - 06/15/20 1055    Education Details Access Code: [email or web address]    Person(s) Educated Patient    Methods Explanation;Demonstration;Verbal cues;Handout    Comprehension Returned demonstration;Verbalized understanding            PT Short Term Goals - 05/25/20 1115      PT SHORT TERM GOAL #1   Title independent with initial HEP    Time 4    Period Weeks    Status Achieved      PT SHORT TERM GOAL #2   Title abdominal pain after exercise decreased >/= 4/10    Baseline pain level 3/10    Time 12    Period Weeks    Status Achieved             PT Long Term Goals - 06/15/20 1100      PT LONG TERM GOAL #1   Title Pt will be I with HEP for advanced HEP    Time 12    Period Weeks    Status On-going      PT LONG TERM GOAL #2   Title able to run with bilateral hip and abdominal </= 1-2/10 due to improved mobility and strength    Baseline pain level for abdominal is 6/10 and hip pain is 3/10    Time 12    Period Weeks    Status On-going      PT LONG TERM GOAL #3   Title able to expand the lower rib cage then into the abdomen to reduce the restrictions in the lower rib cage    Baseline not able to open up the lower rib cage    Time 12    Period Weeks    Status Achieved      PT LONG TERM GOAL #4   Title able to  perform proper body mechnics with taking care of her children and at work to manage her pain    Time 12    Period Weeks    Status On-going                 Plan - 06/15/20 1055    Clinical Impression Statement Patient has just started her cycle and has increased pain. She continues to leak with  coughing, sneezing and when her IC is flared up. Patient pelvis in correct alignment. She has more difficulty with iliacus pullbacks on the left than the right. Unable to do internal work to the pelvic floor due to being on her cycle. Patient has learned more core exercises  with pelvic floor engagement. Patient has hip pain on the left. Patient will benefit from skilled therapy to improve strength and reduce pain.    Personal Factors and Comorbidities Age;Fitness;Comorbidity 3+;Profession    Comorbidities interstial cystitis, seizures with muscle spasms, bipolar    Examination-Activity Limitations Sit;Lift;Continence;Reach Overhead    Examination-Participation Restrictions Meal Prep;Cleaning;Laundry    Stability/Clinical Decision Making Stable/Uncomplicated    Rehab Potential Excellent    PT Duration 12 weeks    PT Treatment/Interventions Cryotherapy;Electrical Stimulation;Iontophoresis 51m/ml Dexamethasone;Moist Heat;Ultrasound;Neuromuscular re-education;Therapeutic exercise;Therapeutic activities;Patient/family education;Manual techniques;Dry needling;Spinal Manipulations;Joint Manipulations    PT Next Visit Plan see about leakage; ; body mechanics with lifting and not leaking urine; she is good till 4/27 so she can make more appts.    PT Home Exercise Plan Access Code: [email or web address]    Consulted and Agree with Plan of Care Patient           Patient will benefit from skilled therapeutic intervention in order to improve the following deficits and impairments:  Decreased range of motion,Increased fascial restricitons,Decreased activity tolerance,Pain,Increased muscle spasms,Decreased strength,Decreased mobility  Visit Diagnosis: Muscle weakness (generalized)  Cramp and spasm     Problem List Patient Active Problem List   Diagnosis Date Noted  . Seizures ([place] 03/13/2017  . Left sided numbness 03/12/2017  . Neck pain 03/12/2017  . UTI (urinary tract infection) 03/12/2017  . Bad headache    . Interstitial cystitis 03/06/2017  . Asthma 06/18/2015  . Attention deficit disorder 06/18/2015  . Bipolar 1 disorder ([place] 06/18/2015  . Dysphagia 06/18/2015  . Esophageal reflux 06/18/2015  . Sinus bradycardia 06/18/2015  . Tension type headache 06/18/2015  . Varicose veins of legs 06/18/2015  . Pregnant 04/16/2015  . Adjustment disorder with depressed mood 10/12/2014  . Anxiety 10/12/2014  . Viral gastroenteritis 10/18/2011    CEarlie Counts PT 06/15/20 11:01 AM   [facility] [address] [place] [address]Phone: [phone]  Fax:  [phone] Name: CKoral ThadenRidenhour MRN: [id number] of Birth: [birth year] PHYSICAL THERAPY DISCHARGE SUMMARY  Visits from Start of Care: 6  Current functional level related to goals / functional outcomes: See above. Patient called to cancel her appointment on 07/16/2020 due to being sick. She wants to be discharged due to her insurance is ending the end of the month.    Remaining deficits: See above.    Education / Equipment: HEP Plan: Patient agrees to discharge.  Patient goals were partially met. Patient is being discharged due to the patient's request. Thank you for the referral. CEarlie Counts PT 07/16/20 8:27 AM   ?????

## 2020-06-22 ENCOUNTER — Ambulatory Visit: Payer: Medicaid Other | Admitting: Physical Therapy

## 2020-06-29 ENCOUNTER — Ambulatory Visit (INDEPENDENT_AMBULATORY_CARE_PROVIDER_SITE_OTHER): Payer: Medicaid Other | Admitting: Neurology

## 2020-06-29 ENCOUNTER — Encounter: Payer: Self-pay | Admitting: Neurology

## 2020-06-29 ENCOUNTER — Other Ambulatory Visit: Payer: Self-pay

## 2020-06-29 VITALS — BP 100/64 | HR 81 | Ht 65.0 in | Wt 185.2 lb

## 2020-06-29 DIAGNOSIS — G40B09 Juvenile myoclonic epilepsy, not intractable, without status epilepticus: Secondary | ICD-10-CM | POA: Diagnosis not present

## 2020-06-29 MED ORDER — LEVETIRACETAM 250 MG PO TABS: 250.0000 mg | ORAL_TABLET | Freq: Two times a day (BID) | ORAL | 3 refills | Status: DC

## 2020-06-29 MED ORDER — TOPIRAMATE 25 MG PO TABS: ORAL_TABLET | ORAL | 11 refills | Status: DC

## 2020-06-29 NOTE — Progress Notes (Signed)
NEUROLOGY FOLLOW UP OFFICE NOTE  Kathy [name] [id number] [birth year]  HISTORY OF PRESENT ILLNESS: I had the pleasure of seeing Kathy [name] in follow-up in the neurology clinic on 06/29/2020.  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 She is on low dose Levetiracetam 250mg  BID. She denies any seizures or seizure-like symptoms since August 2021, no significant myoclonic jerks. She reports the Keppra helps with mood stabilization, but she continues to have difficulty losing weight despite Vegan diet and regular exercise. She has short-term memory issues and word-finding difficulties. She would have "little space outs," but denies any staring/unresponsive episodes. No headache, dizziness, no falls. She tries to get 7-8 hours of sleep, waking up a few times at night but falling back to sleep easily.    History on Initial Assessment 04/06/2017: This is a 30 year old right-handed woman with a history of bipolar disorder, depression, anxiety, ADHD, presenting after hospital admission last 03/11/2017. She recalls having back issues in the beginning of December and saw a chiropractor for adjustment. The next day, she felt lethargic and did not eat much. She recalls feeling anxious, then went to [place] for Christmas shopping with her children. She started to have what she considers as a panic attack, she felt her left ear starting to go numb and she felt herself listing to her left side and feel dizzy. She started getting more anxious and "freaking out" with palpitations, drove to her mother's house 2 miles away. Her mother called EMS, and when they arrived she was in the middle of a panic attack and reports her muscles on the left arm started to spasm. She shows a video on her phone that she took herself, she reports she was awake and alert, and could feel the muscles tightening at her upper back then the left arm jerking would start. She reports being given a  muscle relaxant in the hospital, and the jerking stopped. She was evaluated by Neurology during her admission, she reported left-sided weakness and occipital headaches, as well as arm spasms. she had a normal MRI brain and cervical spine without contrast which I personally reviewed. Due to the left arm spasms, she had an EEG done, which was abnormal showing occasional 2-4 second bursts of generalized high voltage 4 Hz spike and polyspike and wave discharges without clinical correlate. She was started on Keppra the day prior to hospital discharge, and reports the muscle relaxant was stopped. She came back to the ER that evening because she noticed increased pain and left arm spasms. She was given Flexeril and states this helped again with the spasms. She saw PT once since hospital discharge due to left-sided weakness, and feels her strength is better. She states that she has not had any further jerks/spasms since she went back to the ER and restarted the Flexeril. She stopped having the back pain, no further headaches, and stopped the muscle relaxant after a week, with no recurrence of symptoms. She self-tapered Keppra to 250mg  BID after 3 days because it made her very moody and mean. She started feeling a little anxious again the other day when she felt overstimulated at a children's party, but was able to calm herself down with no progression of symptoms. She has a diagnosis of ADHD and anxiety but does not want to take any medications.   She and her mother deny any staring/unresponsive episodes, gaps in time, olfactory/gustatory hallucinations, focal numbness/tingling/weakness, early morning myoclonus. She denies any further headaches,  dizziness, diplopia, dysarthria/dysphagia, bowel dysfunction. She has interstitial cystitis. She was previously seen by one of my partners, Dr. Everlena Cooper in 2015 when she had left facial numbness, and was diagnosed with migraines. She has not had similar symptoms since. She had  vertigo one time related to ear issues. She states she no longer has any neck or back pain.   Epilepsy Risk Factors:  She had a normal birth and early development.  There is no history of febrile convulsions, CNS infections such as meningitis/encephalitis, significant traumatic brain injury, neurosurgical procedures, or family history of seizures.  Diagnostic Data: Repeat EEG in January 2020 again showed frequent generalized high voltage 4 Hz spike and wave discharges lasting 2-5 seconds.  Prior AEDs: Zonisamide, Lamotrigine  PAST MEDICAL HISTORY: Past Medical History:  Diagnosis Date  . Asthma   . Bipolar 1 disorder ([facility])   . Cystitis, interstitial   . Depression    better now, situational, had pp with last child  . Dysthymic disorder   . Frequency of urination   . Headache(784.0)   . History of abnormal cervical Pap smear   . History of ovarian cyst   . History of pelvic inflammatory disease   . History of suicide attempt    06/ 2012  overdose oxycontin  . HSV-1 (herpes simplex virus 1) infection   . Nocturia   . Orthostatic hypotension   . Pelvic pain in female   . Seizures ([facility])   . Vaginal Pap smear, abnormal    ok since    MEDICATIONS: Current Outpatient Medications on File Prior to Visit  Medication Sig Dispense Refill  . albuterol (VENTOLIN HFA) 108 (90 Base) MCG/ACT inhaler Inhale into the lungs.    . doxycycline (VIBRAMYCIN) 100 MG capsule Take 100 mg by mouth 2 (two) times daily.    . ferrous sulfate 325 (65 FE) MG tablet Take 325 mg by mouth daily with breakfast.    . Fexofenadine HCl (ALLEGRA ALLERGY PO) Take 1 tablet by mouth daily as needed (allergies).    Marland Kitchen levETIRAcetam (KEPPRA) 250 MG tablet Take 1 tablet (250 mg total) by mouth 2 (two) times daily. 180 tablet 3  . MAGNESIUM PO Take by mouth.    . vitamin B-12 (CYANOCOBALAMIN) 250 MCG tablet Take 250 mcg by mouth daily.     No current facility-administered medications on file prior to visit.     ALLERGIES: Allergies  Allergen Reactions  . Diflucan [Fluconazole] Itching and Rash  . Latex Itching and Rash    redness  . Zonegran [Zonisamide] Rash    FAMILY HISTORY: Family History  Adopted: Yes  Problem Relation Age of Onset  . Breast cancer Maternal Grandmother   . Anesthesia problems Neg Hx     SOCIAL HISTORY: Social History   Socioeconomic History  . Marital status: Single    Spouse name: Not on file  . Number of children: 3  . Years of education: Not on file  . Highest education level: Not on file  Occupational History  . Not on file  Tobacco Use  . Smoking status: Former Smoker    Packs/day: 0.25    Years: 0.30    Pack years: 0.07    Types: Cigarettes    Quit date: 03/28/2014    Years since quitting: 6.2  . Smokeless tobacco: Never Used  Vaping Use  . Vaping Use: Never used  Substance and Sexual Activity  . Alcohol use: No    Comment: Sober a month ago  .  Drug use: No  . Sexual activity: Yes    Partners: Male    Birth control/protection: None  Other Topics Concern  . Not on file  Social History Narrative   Right handed      Highest level of edu- college      Two story home. Has three children   Social Determinants of Health   Financial Resource Strain: Not on file  Food Insecurity: Not on file  Transportation Needs: Not on file  Physical Activity: Not on file  Stress: Not on file  Social Connections: Not on file  Intimate Partner Violence: Not on file     PHYSICAL EXAM: Vitals:   06/29/20 1135  BP: 100/64  Pulse: 81  SpO2: 98%   General: No acute distress Head:  Normocephalic/atraumatic Skin/Extremities: No rash, no edema Neurological Exam: alert and awake. No aphasia or dysarthria. Fund of knowledge is appropriate.  Recent and remote memory are intact.  Attention and concentration are normal.   Cranial nerves: Pupils equal, round. Extraocular movements intact with no nystagmus. Visual fields full.  No facial asymmetry.   Motor: Bulk and tone normal, muscle strength 5/5 throughout with no pronator drift.   Finger to nose testing intact.  Gait narrow-based and steady, able to tandem walk adequately.  Romberg negative.   IMPRESSION: This is a 30 yo RH woman with a history of ADHD, anxiety, bipolar disorder, depression, with Juvenile Myoclonic Epilepsy. Repeat EEGs have shown generalized high voltage 4 Hz spike and polyspike and wave discharges. MRI brain normal. She had side effects on Zonisamide and on lower dose of Levetiracetam. She feels overall stable on Levetiracetam 250mg  BID, but continues to report difficulty losing weight since starting the LEV. We discussed starting low dose Topiramate, side effects discussed. Start very low dose 25mg  qhs, uptitrate as tolerated. Continue LEV 250mg  BID for now. She is aware of [place] driving laws to stop driving after a seizure until 6 months seizure-free. Follow-up in 4 months, call for any changes.    Thank you for allowing me to participate in her care.  Please do not hesitate to call for any questions or concerns.   , M.D.   CC: , PA-C

## 2020-06-29 NOTE — Patient Instructions (Signed)
1. Start Topiramate 25mg : take 1 tablet every night. Update me in a month on how you are feeling, and if no issues, we will plan to slowly increase dose  2. Continue Keppra 250mg  twice a day  3. Follow-up in 4 months, call for any changes   Seizure Precautions: 1. If medication has been prescribed for you to prevent seizures, take it exactly as directed.  Do not stop taking the medicine without talking to your doctor first, even if you have not had a seizure in a long time.   2. Avoid activities in which a seizure would cause danger to yourself or to others.  Don't operate dangerous machinery, swim alone, or climb in high or dangerous places, such as on ladders, roofs, or girders.  Do not drive unless your doctor says you may.  3. If you have any warning that you may have a seizure, lay down in a safe place where you can't hurt yourself.    4.  No driving for 6 months from last seizure, as per [facility].   Please refer to the following link on the Epilepsy Foundation of America's website for more information: http://www.epilepsyfoundation.org/answerplace/Social/driving/drivingu.cfm   5.  Maintain good sleep hygiene. Avoid alcohol.  6.  Notify your neurology if you are planning pregnancy or if you become pregnant.  7.  Contact your doctor if you have any problems that may be related to the medicine you are taking.  8.  Call 911 and bring the patient back to the ED if:        A.  The seizure lasts longer than 5 minutes.       B.  The patient doesn't awaken shortly after the seizure  C.  The patient has new problems such as difficulty seeing, speaking or moving  D.  The patient was injured during the seizure  E.  The patient has a temperature over 102 F (39C)  F.  The patient vomited and now is having trouble breathing

## 2020-07-16 ENCOUNTER — Encounter: Payer: Medicaid Other | Admitting: Physical Therapy

## 2020-07-31 ENCOUNTER — Telehealth: Payer: Self-pay | Admitting: Neurology

## 2020-07-31 NOTE — Telephone Encounter (Signed)
Pt c/o: seizure Missed medications? No, Just took Wellbutrin later than normal. Patient stated it was a 2 hour difference.  Sleep deprived? No  Alcohol intake? No   Back to their usual baseline self?  Hand is good now but patient is tired. Current medications prescribed by Dr. Karel Jarvis: Keppra 500mg   Patient stated that her seizure started in her  middle finger and it moved up to her wrist where it began to spasm. Patient stated her hand was in a Claw and jerking which lasted for 3 mins where then patient was able to move her hand. Patient stated it happened again but this time for 1 min. Patients Hand from wrist down and stayed in "claw thing" for awhile. Patient states it hurt really bad. Patient states she is feeling better but tired.    Informed patient hat I will send this information to Dr. and we will be in touch once we hear back. Patient verbalized understanding.

## 2020-07-31 NOTE — Telephone Encounter (Signed)
New message    Patient C/o mild seizure at work today in hand / finger, calling to discuss with nurse.

## 2020-08-03 NOTE — Telephone Encounter (Signed)
Pt has also spoken with her other MD she is going to come off her Wellbutrin,

## 2020-08-03 NOTE — Telephone Encounter (Signed)
We had sent a message back when she called that Wellbutrin can trigger seizures in some people. Options are to either keep an eye on her symptoms, increase Keppra (which I don't think she wants to do), or discuss reducing dose of Wellbutrin with her doctor. Thanks

## 2020-10-29 ENCOUNTER — Encounter: Payer: Self-pay | Admitting: Neurology

## 2020-10-29 ENCOUNTER — Ambulatory Visit: Payer: Medicaid Other | Admitting: Neurology

## 2020-10-29 ENCOUNTER — Other Ambulatory Visit: Payer: Self-pay

## 2020-10-29 VITALS — BP 103/67 | HR 55 | Ht 65.0 in | Wt 192.0 lb

## 2020-10-29 DIAGNOSIS — G40B09 Juvenile myoclonic epilepsy, not intractable, without status epilepticus: Secondary | ICD-10-CM | POA: Diagnosis not present

## 2020-10-29 MED ORDER — TOPIRAMATE 25 MG PO TABS: ORAL_TABLET | ORAL | 4 refills | Status: DC

## 2020-10-29 NOTE — Patient Instructions (Signed)
Start Topamax 25mg : take 1 tablet every night  2. Continue Keppra 250mg  twice a day  3. Follow-up in 3-4 months, call for any changes   Seizure Precautions: 1. If medication has been prescribed for you to prevent seizures, take it exactly as directed.  Do not stop taking the medicine without talking to your doctor first, even if you have not had a seizure in a long time.   2. Avoid activities in which a seizure would cause danger to yourself or to others.  Don't operate dangerous machinery, swim alone, or climb in high or dangerous places, such as on ladders, roofs, or girders.  Do not drive unless your doctor says you may.  3. If you have any warning that you may have a seizure, lay down in a safe place where you can't hurt yourself.    4.  No driving for 6 months from last seizure, as per [facility].   Please refer to the following link on the Epilepsy Foundation of America's website for more information: http://www.epilepsyfoundation.org/answerplace/Social/driving/drivingu.cfm   5.  Maintain good sleep hygiene. Avoid alcohol.  6.  Notify your neurology if you are planning pregnancy or if you become pregnant.  7.  Contact your doctor if you have any problems that may be related to the medicine you are taking.  8.  Call 911 and bring the patient back to the ED if:        A.  The seizure lasts longer than 5 minutes.       B.  The patient doesn't awaken shortly after the seizure  C.  The patient has new problems such as difficulty seeing, speaking or moving  D.  The patient was injured during the seizure  E.  The patient has a temperature over 102 F (39C)  F.  The patient vomited and now is having trouble breathing

## 2020-10-29 NOTE — Progress Notes (Signed)
NEUROLOGY FOLLOW UP OFFICE NOTE  Kathy [name] [id number] July 14, 1990  HISTORY OF PRESENT ILLNESS: I had the pleasure of seeing Kathy [name] in follow-up in the neurology clinic on 10/29/2020.  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 She is alone in the office today. She is on low dose Levetiracetam 250mg  BID. We had discussed adding on low dose Topiramate 25mg  qhs, however after seeing her PCP and discussing anxiety and weight issues, she was started on Wellbutrin instead. She contacted our office on 07/31/20 that she had a seizure that started in her middle finger, moved up to her wrist and began to spasm. Her hand was in a claw and jerking for 3 minutes, then another for 1 minute. She denies any loss of awareness but could not talk and had an emotional roller coaster "really quick" where she started crying and feeling guilty that last for a minute. She felt tired after and was good emotionally but still had the myoclonic jerking. She stopped the Wellbutrin and denies any similar bigger episodes, she has occasional little tiny movements in her toe or finger. She is frustrated because the Wellbutrin helped keep her brain quiet with the ADHD, she felt it was helping her and she started losing weight. When she stopped Wellbutrin, weight gain returned, making her very depressed. She states she is moody today because she has not had her period. No pregnancy plans.    History on Initial Assessment 04/06/2017: This is a 30 year old right-handed woman with a history of bipolar disorder, depression, anxiety, ADHD, presenting after hospital admission last 03/11/2017. She recalls having back issues in the beginning of December and saw a chiropractor for adjustment. The next day, she felt lethargic and did not eat much. She recalls feeling anxious, then went to [place] for Christmas shopping with her children. She started to have what she considers as a panic  attack, she felt her left ear starting to go numb and she felt herself listing to her left side and feel dizzy. She started getting more anxious and "freaking out" with palpitations, drove to her mother's house 2 miles away. Her mother called EMS, and when they arrived she was in the middle of a panic attack and reports her muscles on the left arm started to spasm. She shows a video on her phone that she took herself, she reports she was awake and alert, and could feel the muscles tightening at her upper back then the left arm jerking would start. She reports being given a muscle relaxant in the hospital, and the jerking stopped. She was evaluated by Neurology during her admission, she reported left-sided weakness and occipital headaches, as well as arm spasms. she had a normal MRI brain and cervical spine without contrast which I personally reviewed. Due to the left arm spasms, she had an EEG done, which was abnormal showing occasional 2-4 second bursts of generalized high voltage 4 Hz spike and polyspike and wave discharges without clinical correlate. She was started on Keppra the day prior to hospital discharge, and reports the muscle relaxant was stopped. She came back to the ER that evening because she noticed increased pain and left arm spasms. She was given Flexeril and states this helped again with the spasms. She saw PT once since hospital discharge due to left-sided weakness, and feels her strength is better. She states that she has not had any further jerks/spasms since she went back to the ER and restarted  the Flexeril. She stopped having the back pain, no further headaches, and stopped the muscle relaxant after a week, with no recurrence of symptoms. She self-tapered Keppra to 250mg  BID after 3 days because it made her very moody and mean. She started feeling a little anxious again the other day when she felt overstimulated at a children's party, but was able to calm herself down with no progression of  symptoms. She has a diagnosis of ADHD and anxiety but does not want to take any medications.   She and her mother deny any staring/unresponsive episodes, gaps in time, olfactory/gustatory hallucinations, focal numbness/tingling/weakness, early morning myoclonus. She denies any further headaches, dizziness, diplopia, dysarthria/dysphagia, bowel dysfunction. She has interstitial cystitis. She was previously seen by one of my partners, Dr. in 2015 when she had left facial numbness, and was diagnosed with migraines. She has not had similar symptoms since. She had vertigo one time related to ear issues. She states she no longer has any neck or back pain.   Epilepsy Risk Factors:  She had a normal birth and early development.  There is no history of febrile convulsions, CNS infections such as meningitis/encephalitis, significant traumatic brain injury, neurosurgical procedures, or family history of seizures.  Diagnostic Data: Repeat EEG in January 2020 again showed frequent generalized high voltage 4 Hz spike and wave discharges lasting 2-5 seconds.  Prior AEDs: Zonisamide, Lamotrigine  PAST MEDICAL HISTORY: Past Medical History:  Diagnosis Date   Asthma    Bipolar 1 disorder (HCC)    Cystitis, interstitial    Depression    better now, situational, had pp with last child   Dysthymic disorder    Frequency of urination    Headache(784.0)    History of abnormal cervical Pap smear    History of ovarian cyst    History of pelvic inflammatory disease    History of suicide attempt    06/ 2012  overdose oxycontin   HSV-1 (herpes simplex virus 1) infection    Nocturia    Orthostatic hypotension    Pelvic pain in female    Seizures (HCC)    Vaginal Pap smear, abnormal    ok since    MEDICATIONS: Current Outpatient Medications on File Prior to Visit  Medication Sig Dispense Refill   albuterol (VENTOLIN HFA) 108 (90 Base) MCG/ACT inhaler Inhale into the lungs.     ferrous sulfate 325 (65  FE) MG tablet Take 325 mg by mouth daily with breakfast.     Fexofenadine HCl (ALLEGRA ALLERGY PO) Take 1 tablet by mouth daily as needed (allergies).     levETIRAcetam (KEPPRA) 250 MG tablet Take 1 tablet (250 mg total) by mouth 2 (two) times daily. 180 tablet 3   MAGNESIUM PO Take by mouth.     vitamin B-12 (CYANOCOBALAMIN) 250 MCG tablet Take 250 mcg by mouth daily.     No current facility-administered medications on file prior to visit.    ALLERGIES: Allergies  Allergen Reactions   Diflucan [Fluconazole] Itching and Rash   Latex Itching and Rash    redness   Zonegran [Zonisamide] Rash    FAMILY HISTORY: Family History  Adopted: Yes  Problem Relation Age of Onset   Breast cancer Maternal Grandmother    Anesthesia problems Neg Hx     SOCIAL HISTORY: Social History   Socioeconomic History   Marital status: Single    Spouse name: Not on file   Number of children: 3   Years of education: Not on file  Highest education level: Not on file  Occupational History   Not on file  Tobacco Use   Smoking status: Former    Packs/day: 0.25    Years: 0.30    Pack years: 0.08    Types: Cigarettes    Quit date: 03/28/2014    Years since quitting: 6.5   Smokeless tobacco: Never  Vaping Use   Vaping Use: Never used  Substance and Sexual Activity   Alcohol use: No    Comment: Sober a month ago   Drug use: No   Sexual activity: Yes    Partners: Male    Birth control/protection: None  Other Topics Concern   Not on file  Social History Narrative   Right handed      Highest level of edu- college      Two story home. Has three children   Social Determinants of Health   Financial Resource Strain: Not on file  Food Insecurity: Not on file  Transportation Needs: Not on file  Physical Activity: Not on file  Stress: Not on file  Social Connections: Not on file  Intimate Partner Violence: Not on file     PHYSICAL EXAM: Vitals:   10/29/20 0900  BP: 103/67  Pulse: (!)  55  SpO2: 98%   General: No acute distress Head:  Normocephalic/atraumatic Skin/Extremities: No rash, no edema Neurological Exam: alert and awake. No aphasia or dysarthria. Fund of knowledge is appropriate.  Recent and remote memory are intact.  Attention and concentration are normal.   Cranial nerves: Pupils equal, round. Extraocular movements intact with no nystagmus. Visual fields full.  No facial asymmetry.  Motor: Bulk and tone normal, muscle strength 5/5 throughout with no pronator drift.   Finger to nose testing intact.  Gait narrow-based and steady, no ataxia   IMPRESSION: This is a 30 yo RH woman with a history of ADHD, anxiety, bipolar disorder, depression, with Juvenile Myoclonic Epilepsy. Her recent seizure last 07/31/20 has some focal features with right hand jerking and ?dystonic posturing however repeat EEGs have shown generalized high voltage 4 Hz spike and polyspike and wave discharges seen with generalized epilepsy. MRI brain normal. She is on a low dose of Levetiracetam 250mg  BID with side effects on higher dose. She expressed frustration with her medical issues, including epilepsy, weight gain, ADHD. We again discussed starting Topiramate 25mg  qhs, side effects discussed, this may help with weight loss and anxiety as well. Discussed this would not help with ADHD, she will ask about restarting Adderall with her PCP which should be fine from an epilepsy standpoint. She will update our office in a month, if no issues on Topiramate, we will uptitrate dose and potentially wean off Levetiracetam, but would continue LEV 250mg  BID for now. She reports mood changes and frustration, declines psychotherapy and states SSRIs have not helped in the past. She is aware of [place] driving laws to stop driving after a seizure until 6 months seizure-free. Follow-up in 3 months, call for any changes.    Thank you for allowing me to participate in her care.  Please do not hesitate to call for any questions or  concerns.    , M.D.   CC: , PA-C

## 2021-02-04 ENCOUNTER — Encounter: Payer: Self-pay | Admitting: Neurology

## 2021-02-04 ENCOUNTER — Other Ambulatory Visit: Payer: Self-pay

## 2021-02-04 ENCOUNTER — Ambulatory Visit (INDEPENDENT_AMBULATORY_CARE_PROVIDER_SITE_OTHER): Payer: Medicaid Other | Admitting: Neurology

## 2021-02-04 VITALS — BP 104/70 | HR 69 | Resp 18 | Ht 65.0 in | Wt 190.0 lb

## 2021-02-04 DIAGNOSIS — G40B09 Juvenile myoclonic epilepsy, not intractable, without status epilepticus: Secondary | ICD-10-CM

## 2021-02-04 NOTE — Progress Notes (Signed)
NEUROLOGY FOLLOW UP OFFICE NOTE  Kathy [name] [id number] [birth year]  HISTORY OF PRESENT ILLNESS: I had the pleasure of seeing Kathy [name] in follow-up in the neurology clinic on 02/04/2021.  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 She is alone in the office today. On her last visit, she continued to express frustration with weight issues, anxiety, ADHD. Wellbutrin appeared to trigger a seizure so it was stopped. She was started on low dose Topiramate which did help with weight loss and mood, but she was constantly hot, not sleeping, always using the bathroom, mixing up words and opted to stay on low dose Levetiracetam 250mg  BID. She denies any major seizures since 07/2020. She has little twitches at times. She denies any loss of awareness. She has been feeling very tired, not sleeping well since the time change last weekend. She has been a little off balance, veering off sometimes, but it is not a daily thing. No falls. She has a lot on her plate right now, she states she is not depressed "just very tired."    History on Initial Assessment 04/06/2017: This is a 29 year old right-handed woman with a history of bipolar disorder, depression, anxiety, ADHD, presenting after hospital admission last 03/11/2017. She recalls having back issues in the beginning of December and saw a chiropractor for adjustment. The next day, she felt lethargic and did not eat much. She recalls feeling anxious, then went to [place] for Christmas shopping with her children. She started to have what she considers as a panic attack, she felt her left ear starting to go numb and she felt herself listing to her left side and feel dizzy. She started getting more anxious and "freaking out" with palpitations, drove to her mother's house 2 miles away. Her mother called EMS, and when they arrived she was in the middle of a panic attack and reports her muscles on the left arm started to  spasm. She shows a video on her phone that she took herself, she reports she was awake and alert, and could feel the muscles tightening at her upper back then the left arm jerking would start. She reports being given a muscle relaxant in the hospital, and the jerking stopped. She was evaluated by Neurology during her admission, she reported left-sided weakness and occipital headaches, as well as arm spasms. she had a normal MRI brain and cervical spine without contrast which I personally reviewed. Due to the left arm spasms, she had an EEG done, which was abnormal showing occasional 2-4 second bursts of generalized high voltage 4 Hz spike and polyspike and wave discharges without clinical correlate. She was started on Keppra the day prior to hospital discharge, and reports the muscle relaxant was stopped. She came back to the ER that evening because she noticed increased pain and left arm spasms. She was given Flexeril and states this helped again with the spasms. She saw PT once since hospital discharge due to left-sided weakness, and feels her strength is better. She states that she has not had any further jerks/spasms since she went back to the ER and restarted the Flexeril. She stopped having the back pain, no further headaches, and stopped the muscle relaxant after a week, with no recurrence of symptoms. She self-tapered Keppra to 250mg  BID after 3 days because it made her very moody and mean. She started feeling a little anxious again the other day when she felt overstimulated at a children's party, but  was able to calm herself down with no progression of symptoms. She has a diagnosis of ADHD and anxiety but does not want to take any medications.   She and her mother deny any staring/unresponsive episodes, gaps in time, olfactory/gustatory hallucinations, focal numbness/tingling/weakness, early morning myoclonus. She denies any further headaches, dizziness, diplopia, dysarthria/dysphagia, bowel dysfunction.  She has interstitial cystitis. She was previously seen by one of my partners, Dr. Everlena Cooper in 2015 when she had left facial numbness, and was diagnosed with migraines. She has not had similar symptoms since. She had vertigo one time related to ear issues. She states she no longer has any neck or back pain.   Epilepsy Risk Factors:  She had a normal birth and early development.  There is no history of febrile convulsions, CNS infections such as meningitis/encephalitis, significant traumatic brain injury, neurosurgical procedures, or family history of seizures.  Diagnostic Data: Repeat EEG in January 2020 again showed frequent generalized high voltage 4 Hz spike and wave discharges lasting 2-5 seconds.  Prior AEDs: Zonisamide, Lamotrigine  PAST MEDICAL HISTORY: Past Medical History:  Diagnosis Date   Asthma    Bipolar 1 disorder (HCC)    Cystitis, interstitial    Depression    better now, situational, had pp with last child   Dysthymic disorder    Frequency of urination    Headache(784.0)    History of abnormal cervical Pap smear    History of ovarian cyst    History of pelvic inflammatory disease    History of suicide attempt    06/ 2012  overdose oxycontin   HSV-1 (herpes simplex virus 1) infection    Nocturia    Orthostatic hypotension    Pelvic pain in female    Seizures (HCC)    Vaginal Pap smear, abnormal    ok since    MEDICATIONS: Current Outpatient Medications on File Prior to Visit  Medication Sig Dispense Refill   albuterol (VENTOLIN HFA) 108 (90 Base) MCG/ACT inhaler Inhale into the lungs.     ferrous sulfate 325 (65 FE) MG tablet Take 325 mg by mouth daily with breakfast.     Fexofenadine HCl (ALLEGRA ALLERGY PO) Take 1 tablet by mouth daily as needed (allergies).     levETIRAcetam (KEPPRA) 250 MG tablet Take 1 tablet (250 mg total) by mouth 2 (two) times daily. 180 tablet 3   MAGNESIUM PO Take by mouth.     vitamin B-12 (CYANOCOBALAMIN) 250 MCG tablet Take 250 mcg  by mouth daily.     topiramate (TOPAMAX) 25 MG tablet Take 1 tablet every night (Patient not taking: Reported on 02/04/2021) 30 tablet 4   No current facility-administered medications on file prior to visit.    ALLERGIES: Allergies  Allergen Reactions   Diflucan [Fluconazole] Itching and Rash   Latex Itching and Rash    redness   Zonegran [Zonisamide] Rash    FAMILY HISTORY: Family History  Adopted: Yes  Problem Relation Age of Onset   Breast cancer Maternal Grandmother    Anesthesia problems Neg Hx     SOCIAL HISTORY: Social History   Socioeconomic History   Marital status: Single    Spouse name: Not on file   Number of children: 3   Years of education: Not on file   Highest education level: Not on file  Occupational History   Not on file  Tobacco Use   Smoking status: Former    Packs/day: 0.25    Years: 0.30    Pack years:  0.08    Types: Cigarettes    Quit date: 03/28/2014    Years since quitting: 6.8   Smokeless tobacco: Never  Vaping Use   Vaping Use: Never used  Substance and Sexual Activity   Alcohol use: No    Comment: Sober a month ago   Drug use: No   Sexual activity: Yes    Partners: Male    Birth control/protection: None  Other Topics Concern   Not on file  Social History Narrative   Right handed      Highest level of edu- college      Two story home. Has three children   Social Determinants of Health   Financial Resource Strain: Not on file  Food Insecurity: Not on file  Transportation Needs: Not on file  Physical Activity: Not on file  Stress: Not on file  Social Connections: Not on file  Intimate Partner Violence: Not on file     PHYSICAL EXAM: Vitals:   02/04/21 1031  BP: 104/70  Pulse: 69  Resp: 18  SpO2: 95%   General: No acute distress, tired-appearing Head:  Normocephalic/atraumatic Skin/Extremities: No rash, no edema Neurological Exam: alert and awake. No aphasia or dysarthria. Fund of knowledge is appropriate.   Attention and concentration are normal.   Cranial nerves: Pupils equal, round. Extraocular movements intact with no nystagmus. Visual fields full.  No facial asymmetry.  Motor: Bulk and tone normal, muscle strength 5/5 throughout with no pronator drift.   Finger to nose testing intact.  Gait narrow-based and steady, no ataxia   IMPRESSION: This is a 30 yo RH woman with a history of ADHD, anxiety, bipolar disorder, depression, with Juvenile Myoclonic Epilepsy. Her recent seizure last 07/31/20 has some focal features with right hand jerking and ?dystonic posturing however repeat EEGs have shown generalized high voltage 4 Hz spike and polyspike and wave discharges seen with generalized epilepsy. MRI brain normal. She has had side effects on various seizure medications, but is interested in restarting low dose Lamotrigine. She is scheduled to see Endocrinology next month for various issues, she has been concerned Keppra has been causing her inability to lose weight however she is on a very low dose. She reports there is something going on with her nervous system making her very sensitive to medications. I feel she would benefit from seeing Behavioral Health at some point as well. We will plan to add on low dose Lamotrigine 25mg  daily to Levetiracetam 250mg  BID on her next visit in 2-3 months. She is aware of [place] driving laws to stop driving after a seizure until 6 months seizure-free. Follow-up in 2-3 months, call for any changes.      Thank you for allowing me to participate in her care.  Please do not hesitate to call for any questions or concerns.    , M.D.   CC: , PA-C

## 2021-02-04 NOTE — Patient Instructions (Signed)
Continue Levetiracetam 250mg  twice a day  2. Proceed with Endocrinology evaluation as scheduled  3. Follow-up in 2-3 months, call for any changes   Seizure Precautions: 1. If medication has been prescribed for you to prevent seizures, take it exactly as directed.  Do not stop taking the medicine without talking to your doctor first, even if you have not had a seizure in a long time.   2. Avoid activities in which a seizure would cause danger to yourself or to others.  Don't operate dangerous machinery, swim alone, or climb in high or dangerous places, such as on ladders, roofs, or girders.  Do not drive unless your doctor says you may.  3. If you have any warning that you may have a seizure, lay down in a safe place where you can't hurt yourself.    4.  No driving for 6 months from last seizure, as per [facility].   Please refer to the following link on the [employer]ile[employer]'s website for more information: http://www.epilepsyfoundation.org/answerplace/Social/driving/drivingu.cfm   5.  Maintain good sleep hygiene. Avoid alcohol.  6.  Notify your neurology if you are planning pregnancy or if you become pregnant.  7.  Contact your doctor if you have any problems that may be related to the medicine you are taking.  8.  Call 911 and bring the patient back to the ED if:        A.  The seizure lasts longer than 5 minutes.       B.  The patient doesn't awaken shortly after the seizure  C.  The patient has new problems such as difficulty seeing, speaking or moving  D.  The patient was injured during the seizure  E.  The patient has a temperature over 102 F (39C)  F.  The patient vomited and now is having trouble breathing

## 2021-04-26 ENCOUNTER — Ambulatory Visit: Payer: Medicaid Other | Admitting: Neurology

## 2021-05-11 ENCOUNTER — Encounter: Payer: Self-pay | Admitting: Neurology

## 2021-05-11 ENCOUNTER — Other Ambulatory Visit: Payer: Self-pay

## 2021-05-11 ENCOUNTER — Ambulatory Visit: Payer: Medicaid Other | Admitting: Neurology

## 2021-05-11 VITALS — BP 125/73 | HR 55 | Ht 65.0 in | Wt 191.2 lb

## 2021-05-11 DIAGNOSIS — G40B09 Juvenile myoclonic epilepsy, not intractable, without status epilepticus: Secondary | ICD-10-CM

## 2021-05-11 DIAGNOSIS — R635 Abnormal weight gain: Secondary | ICD-10-CM

## 2021-05-11 MED ORDER — LEVETIRACETAM 250 MG PO TABS: 250.0000 mg | ORAL_TABLET | Freq: Two times a day (BID) | ORAL | 3 refills | Status: DC

## 2021-05-11 NOTE — Patient Instructions (Signed)
Good to see you.  Continue Keppra 250mg  twice a day  2. Referral will be sent to [facility] (tel no. [phone])  3. Follow-up in 6 months, call for any changes.

## 2021-05-11 NOTE — Progress Notes (Signed)
NEUROLOGY FOLLOW UP OFFICE NOTE  Kathy [name] [id number] December 14, 1990  HISTORY OF PRESENT ILLNESS: I had the pleasure of seeing Kathy [name] in follow-up in the neurology clinic on 05/11/2021.  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 She is alone in the office today. On her last visit, she reported side effects on low dose Topiramate. She considered starting Lamotrigine on this visit, however at this time would like to stay on Levetiracetam 250mg  BID. She is concerned about how she feels when she would taper medication, when she missed a dose of LEV, she feels different. She has not had any major seizures since 07/2020, she has little twitches every now and then if she is tired or does not drink enough water. No loss of awareness. She denies any headaches. She has rare dizziness. Her right shoulder/upper back, and arm always feel like they are not part of her body, it gets numb and heavy. No falls. She is overall tolerating LEV, she does not have hair loss and mood is stable. She usually gets 7 hours of sleep. She has seen Endocrinology and does not have PCOS. She did not get to check her cortisol levels. They had discussed weight loss options, despite regular exercise and eating healthy, the weight is not coming off.    History on Initial Assessment 04/06/2017: This is a 31 year old right-handed woman with a history of bipolar disorder, depression, anxiety, ADHD, presenting after hospital admission last 03/11/2017. She recalls having back issues in the beginning of December and saw a chiropractor for adjustment. The next day, she felt lethargic and did not eat much. She recalls feeling anxious, then went to [place] for Christmas shopping with her children. She started to have what she considers as a panic attack, she felt her left ear starting to go numb and she felt herself listing to her left side and feel dizzy. She started getting more anxious and  "freaking out" with palpitations, drove to her mother's house 2 miles away. Her mother called EMS, and when they arrived she was in the middle of a panic attack and reports her muscles on the left arm started to spasm. She shows a video on her phone that she took herself, she reports she was awake and alert, and could feel the muscles tightening at her upper back then the left arm jerking would start. She reports being given a muscle relaxant in the hospital, and the jerking stopped. She was evaluated by Neurology during her admission, she reported left-sided weakness and occipital headaches, as well as arm spasms. she had a normal MRI brain and cervical spine without contrast which I personally reviewed. Due to the left arm spasms, she had an EEG done, which was abnormal showing occasional 2-4 second bursts of generalized high voltage 4 Hz spike and polyspike and wave discharges without clinical correlate. She was started on Keppra the day prior to hospital discharge, and reports the muscle relaxant was stopped. She came back to the ER that evening because she noticed increased pain and left arm spasms. She was given Flexeril and states this helped again with the spasms. She saw PT once since hospital discharge due to left-sided weakness, and feels her strength is better. She states that she has not had any further jerks/spasms since she went back to the ER and restarted the Flexeril. She stopped having the back pain, no further headaches, and stopped the muscle relaxant after a week, with no  recurrence of symptoms. She self-tapered Keppra to 250mg  BID after 3 days because it made her very moody and mean. She started feeling a little anxious again the other day when she felt overstimulated at a children's party, but was able to calm herself down with no progression of symptoms. She has a diagnosis of ADHD and anxiety but does not want to take any medications.   She and her mother deny any staring/unresponsive  episodes, gaps in time, olfactory/gustatory hallucinations, focal numbness/tingling/weakness, early morning myoclonus. She denies any further headaches, dizziness, diplopia, dysarthria/dysphagia, bowel dysfunction. She has interstitial cystitis. She was previously seen by one of my partners, Dr. Everlena Cooper in 2015 when she had left facial numbness, and was diagnosed with migraines. She has not had similar symptoms since. She had vertigo one time related to ear issues. She states she no longer has any neck or back pain.   Epilepsy Risk Factors:  She had a normal birth and early development.  There is no history of febrile convulsions, CNS infections such as meningitis/encephalitis, significant traumatic brain injury, neurosurgical procedures, or family history of seizures.  Diagnostic Data: Repeat EEG in January 2020 again showed frequent generalized high voltage 4 Hz spike and wave discharges lasting 2-5 seconds.  Prior AEDs: Zonisamide, Lamotrigine, Topiramate  PAST MEDICAL HISTORY: Past Medical History:  Diagnosis Date   Asthma    Bipolar 1 disorder (HCC)    Cystitis, interstitial    Depression    better now, situational, had pp with last child   Dysthymic disorder    Frequency of urination    Headache(784.0)    History of abnormal cervical Pap smear    History of ovarian cyst    History of pelvic inflammatory disease    History of suicide attempt    06/ 2012  overdose oxycontin   HSV-1 (herpes simplex virus 1) infection    Nocturia    Orthostatic hypotension    Pelvic pain in female    Seizures (HCC)    Vaginal Pap smear, abnormal    ok since    MEDICATIONS: Current Outpatient Medications on File Prior to Visit  Medication Sig Dispense Refill   albuterol (VENTOLIN HFA) 108 (90 Base) MCG/ACT inhaler Inhale into the lungs.     ferrous sulfate 325 (65 FE) MG tablet Take 325 mg by mouth daily with breakfast.     Fexofenadine HCl (ALLEGRA ALLERGY PO) Take 1 tablet by mouth daily as  needed (allergies).     levETIRAcetam (KEPPRA) 250 MG tablet Take 1 tablet (250 mg total) by mouth 2 (two) times daily. 180 tablet 3   MAGNESIUM PO Take by mouth.     vitamin B-12 (CYANOCOBALAMIN) 250 MCG tablet Take 250 mcg by mouth daily.     No current facility-administered medications on file prior to visit.    ALLERGIES: Allergies  Allergen Reactions   Diflucan [Fluconazole] Itching and Rash   Latex Itching and Rash    redness   Zonegran [Zonisamide] Rash    FAMILY HISTORY: Family History  Adopted: Yes  Problem Relation Age of Onset   Breast cancer Maternal Grandmother    Anesthesia problems Neg Hx     SOCIAL HISTORY: Social History   Socioeconomic History   Marital status: Single    Spouse name: Not on file   Number of children: 3   Years of education: Not on file   Highest education level: Not on file  Occupational History   Not on file  Tobacco Use  Smoking status: Former    Packs/day: 0.25    Years: 0.30    Pack years: 0.08    Types: Cigarettes    Quit date: 03/28/2014    Years since quitting: 7.1   Smokeless tobacco: Never  Vaping Use   Vaping Use: Never used  Substance and Sexual Activity   Alcohol use: No    Comment: Sober a month ago   Drug use: No   Sexual activity: Yes    Partners: Male    Birth control/protection: None  Other Topics Concern   Not on file  Social History Narrative   Right handed      Highest level of edu- college      Two story home. Has three children   Social Determinants of Health   Financial Resource Strain: Not on file  Food Insecurity: Not on file  Transportation Needs: Not on file  Physical Activity: Not on file  Stress: Not on file  Social Connections: Not on file  Intimate Partner Violence: Not on file     PHYSICAL EXAM: Vitals:   05/11/21 0858  BP: 125/73  Pulse: (!) 55  SpO2: 100%   General: No acute distress Head:  Normocephalic/atraumatic Skin/Extremities: No rash, no edema Neurological  Exam: alert and awake. No aphasia or dysarthria. Fund of knowledge is appropriate. Attention and concentration are normal.   Cranial nerves: Pupils equal, round. Extraocular movements intact with no nystagmus. Visual fields full.  No facial asymmetry.  Motor: Bulk and tone normal, muscle strength 5/5 throughout with no pronator drift.   Finger to nose testing intact.  Gait narrow-based and steady, able to tandem walk adequately.  Romberg negative.   IMPRESSION: This is a 31 yo RH woman with a history of ADHD, anxiety, bipolar disorder, depression, with Juvenile Myoclonic Epilepsy. Her last seizure in May 2022 had some focal features with right hand jerking and ?dystonic posturing however repeat EEGs have shown generalized high voltage 4 Hz spike and polyspike and wave discharges seen with generalized epilepsy. MRI brain normal. She has been sensitive to seizure medications, even at low doses, and would like to stay on low dose Levetiracetam 250mg  BID, refills sent. She has been unable to lose weight since starting Levetiracetam, she has seen Endo and would like a referral to [facility] weight loss and management. She is aware of [place] driving laws to stop driving after a seizure until 6 months seizure-free. Follow-up in 6 months, call for any changes.     Thank you for allowing me to participate in her care.  Please do not hesitate to call for any questions or concerns.    [facility], M.D.   CC: Patrcia Dolly, PA-C

## 2021-06-28 ENCOUNTER — Encounter: Payer: Medicaid Other | Attending: Neurology | Admitting: Dietician

## 2021-10-18 ENCOUNTER — Ambulatory Visit: Payer: Medicaid Other | Admitting: Neurology

## 2021-11-24 ENCOUNTER — Encounter: Payer: Self-pay | Admitting: Neurology

## 2021-11-24 ENCOUNTER — Telehealth (INDEPENDENT_AMBULATORY_CARE_PROVIDER_SITE_OTHER): Payer: Medicaid Other | Admitting: Neurology

## 2021-11-24 VITALS — Ht 64.0 in | Wt 180.0 lb

## 2021-11-24 DIAGNOSIS — G40B09 Juvenile myoclonic epilepsy, not intractable, without status epilepticus: Secondary | ICD-10-CM | POA: Diagnosis not present

## 2021-11-24 MED ORDER — LEVETIRACETAM 250 MG PO TABS: 250.0000 mg | ORAL_TABLET | Freq: Two times a day (BID) | ORAL | 3 refills | Status: DC

## 2021-11-24 NOTE — Patient Instructions (Signed)
Good to see you. Continue Levetiracetam 250mg  twice a day. Follow-up in 8 months, call for any changes.    Seizure Precautions: 1. If medication has been prescribed for you to prevent seizures, take it exactly as directed.  Do not stop taking the medicine without talking to your doctor first, even if you have not had a seizure in a long time.   2. Avoid activities in which a seizure would cause danger to yourself or to others.  Don't operate dangerous machinery, swim alone, or climb in high or dangerous places, such as on ladders, roofs, or girders.  Do not drive unless your doctor says you may.  3. If you have any warning that you may have a seizure, lay down in a safe place where you can't hurt yourself.    4.  No driving for 6 months from last seizure, as per [facility].   Please refer to the following link on the Epilepsy Foundation of America's website for more information: http://www.epilepsyfoundation.org/answerplace/Social/driving/drivingu.cfm   5.  Maintain good sleep hygiene. Avoid alcohol.  6.  Notify your neurology if you are planning pregnancy or if you become pregnant.  7.  Contact your doctor if you have any problems that may be related to the medicine you are taking.  8.  Call 911 and bring the patient back to the ED if:        A.  The seizure lasts longer than 5 minutes.       B.  The patient doesn't awaken shortly after the seizure  C.  The patient has new problems such as difficulty seeing, speaking or moving  D.  The patient was injured during the seizure  E.  The patient has a temperature over 102 F (39C)  F.  The patient vomited and now is having trouble breathing

## 2021-11-24 NOTE — Progress Notes (Signed)
Virtual Visit via Video Note The purpose of this virtual visit is to provide medical care while limiting exposure to the novel coronavirus.    Consent was obtained for video visit:  Yes.   Answered questions that patient had about telehealth interaction:  Yes.   I discussed the limitations, risks, security and privacy concerns of performing an evaluation and management service by telemedicine. I also discussed with the patient that there may be a patient responsible charge related to this service. The patient expressed understanding and agreed to proceed.  Pt location: Home Physician Location: office Name of referring provider:  Richmond Campbell., PA-C I connected with Jonna Clark Tallie at patients initiation/request on 11/24/2021 at 11:30 AM EDT by video enabled telemedicine application and verified that I am speaking with the correct person using two identifiers. Pt MRN:  [id number] Pt DOB:  01-31-1991 Video Participants:  Jonna Clark Vig   History of Present Illness:  The patient had a virtual video visit on 11/24/2021. She was last seen in the neurology clinic 6 months ago for Juvenile Myoclonic Epilepsy. She is on low dose Levetiracetam 250mg  BID. Since her last visit, she denies any major seizures since 07/2020. She has little twitches every now and then, usually near her period. She denies any staring/unresponsive episodes. Her right arm/shoulder sometimes still feels tight/stiff. No tingling/numbness. She denies any headaches. She gets dizzy when she does not eat or drink enough. She still notices at times that her word recall is "kind of crappy," she can't think of a word. 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 She would like to stay on her current regimen and feels content on it. No falls.   History on Initial Assessment 04/06/2017: This is a 31 year old right-handed woman with a history of bipolar disorder, depression, anxiety, ADHD,  presenting after hospital admission last 03/11/2017. She recalls having back issues in the beginning of December and saw a chiropractor for adjustment. The next day, she felt lethargic and did not eat much. She recalls feeling anxious, then went to [place] for Christmas shopping with her children. She started to have what she considers as a panic attack, she felt her left ear starting to go numb and she felt herself listing to her left side and feel dizzy. She started getting more anxious and "freaking out" with palpitations, drove to her mother's house 2 miles away. Her mother called EMS, and when they arrived she was in the middle of a panic attack and reports her muscles on the left arm started to spasm. She shows a video on her phone that she took herself, she reports she was awake and alert, and could feel the muscles tightening at her upper back then the left arm jerking would start. She reports being given a muscle relaxant in the hospital, and the jerking stopped. She was evaluated by Neurology during her admission, she reported left-sided weakness and occipital headaches, as well as arm spasms. she had a normal MRI brain and cervical spine without contrast which I personally reviewed. Due to the left arm spasms, she had an EEG done, which was abnormal showing occasional 2-4 second bursts of generalized high voltage 4 Hz spike and polyspike and wave discharges without clinical correlate. She was started on Keppra the day prior to hospital discharge, and reports the muscle relaxant was stopped. She came back to the ER that evening because she noticed increased pain and left arm spasms.  She was given Flexeril and states this helped again with the spasms. She saw PT once since hospital discharge due to left-sided weakness, and feels her strength is better. She states that she has not had any further jerks/spasms since she went back to the ER and restarted the Flexeril. She stopped having the back pain, no  further headaches, and stopped the muscle relaxant after a week, with no recurrence of symptoms. She self-tapered Keppra to 250mg  BID after 3 days because it made her very moody and mean. She started feeling a little anxious again the other day when she felt overstimulated at a children's party, but was able to calm herself down with no progression of symptoms. She has a diagnosis of ADHD and anxiety but does not want to take any medications.   She and her mother deny any staring/unresponsive episodes, gaps in time, olfactory/gustatory hallucinations, focal numbness/tingling/weakness, early morning myoclonus. She denies any further headaches, dizziness, diplopia, dysarthria/dysphagia, bowel dysfunction. She has interstitial cystitis. She was previously seen by one of my partners, Dr. in 2015 when she had left facial numbness, and was diagnosed with migraines. She has not had similar symptoms since. She had vertigo one time related to ear issues. She states she no longer has any neck or back pain.   Epilepsy Risk Factors:  She had a normal birth and early development.  There is no history of febrile convulsions, CNS infections such as meningitis/encephalitis, significant traumatic brain injury, neurosurgical procedures, or family history of seizures.  Diagnostic Data: Repeat EEG in January 2020 again showed frequent generalized high voltage 4 Hz spike and wave discharges lasting 2-5 seconds.  Prior AEDs: Zonisamide, Lamotrigine, Topiramate   Current Outpatient Medications on File Prior to Visit  Medication Sig Dispense Refill   albuterol (VENTOLIN HFA) 108 (90 Base) MCG/ACT inhaler Inhale into the lungs.     ferrous sulfate 325 (65 FE) MG tablet Take 325 mg by mouth daily with breakfast.     Fexofenadine HCl (ALLEGRA ALLERGY PO) Take 1 tablet by mouth daily as needed (allergies).     levETIRAcetam (KEPPRA) 250 MG tablet Take 1 tablet (250 mg total) by mouth 2 (two) times daily. 180 tablet 3    MAGNESIUM PO Take by mouth.     vitamin B-12 (CYANOCOBALAMIN) 250 MCG tablet Take 250 mcg by mouth daily.     No current facility-administered medications on file prior to visit.     Observations/Objective:   Vitals:   11/24/21 1108  Weight: 180 lb (81.6 kg)  Height: 5\' 4"  (1.626 m)   GEN:  The patient appears stated age and is in NAD.  Neurological examination: Patient is awake, alert. No aphasia or dysarthria. Intact fluency and comprehension. Cranial nerves: Extraocular movements intact. No facial asymmetry. Motor: moves all extremities symmetrically, at least anti-gravity x 4.    Assessment and Plan:   This is a 31 yo RH woman with a history of ADHD, anxiety, bipolar disorder, depression, with Juvenile Myoclonic Epilepsy. Her last seizure in May 2022 had some focal features with right hand jerking and ?dystonic posturing however repeat EEGs have shown generalized high voltage 4 Hz spike and polyspike and wave discharges seen with generalized epilepsy. MRI brain normal. She is content with current regimen of Levetiracetam 250mg  BID with no major seizures since 07/2020. She is aware of [place] driving laws to stop driving after a seizure until 6 months seizure-free. Follow-up in 8 months, call for any changes.    Follow Up Instructions:   -  I discussed the assessment and treatment plan with the patient. The patient was provided an opportunity to ask questions and all were answered. The patient agreed with the plan and demonstrated an understanding of the instructions.   The patient was advised to call back or seek an in-person evaluation if the symptoms worsen or if the condition fails to improve as anticipated.     Cameron Sprang, MD

## 2022-02-22 ENCOUNTER — Ambulatory Visit: Payer: Medicaid Other | Admitting: Neurology

## 2022-07-26 ENCOUNTER — Ambulatory Visit: Payer: Medicaid Other | Admitting: Neurology

## 2022-07-29 ENCOUNTER — Emergency Department (HOSPITAL_BASED_OUTPATIENT_CLINIC_OR_DEPARTMENT_OTHER): Payer: Medicaid Other

## 2022-07-29 ENCOUNTER — Other Ambulatory Visit: Payer: Self-pay

## 2022-07-29 ENCOUNTER — Emergency Department (HOSPITAL_BASED_OUTPATIENT_CLINIC_OR_DEPARTMENT_OTHER)
Admission: EM | Admit: 2022-07-29 | Discharge: 2022-07-29 | Disposition: A | Discharge status: Home / Self Care | Payer: Medicaid Other | Attending: Emergency Medicine | Admitting: Emergency Medicine

## 2022-07-29 ENCOUNTER — Other Ambulatory Visit (HOSPITAL_BASED_OUTPATIENT_CLINIC_OR_DEPARTMENT_OTHER): Payer: Self-pay

## 2022-07-29 DIAGNOSIS — Z87891 Personal history of nicotine dependence: Secondary | ICD-10-CM | POA: Insufficient documentation

## 2022-07-29 DIAGNOSIS — R1011 Right upper quadrant pain: Secondary | ICD-10-CM | POA: Diagnosis not present

## 2022-07-29 DIAGNOSIS — Z91040 Latex allergy status: Secondary | ICD-10-CM | POA: Insufficient documentation

## 2022-07-29 LAB — URINALYSIS, ROUTINE W REFLEX MICROSCOPIC
Bilirubin Urine: NEGATIVE
Glucose, UA: NEGATIVE mg/dL
Hgb urine dipstick: NEGATIVE
Ketones, ur: NEGATIVE mg/dL
Nitrite: NEGATIVE
Specific Gravity, Urine: 1.014 (ref 1.005–1.030)
pH: 7.5 (ref 5.0–8.0)

## 2022-07-29 LAB — COMPREHENSIVE METABOLIC PANEL
ALT: 9 U/L (ref 0–44)
AST: 14 U/L — ABNORMAL LOW (ref 15–41)
Albumin: 3.9 g/dL (ref 3.5–5.0)
Alkaline Phosphatase: 44 U/L (ref 38–126)
Anion gap: 7 (ref 5–15)
BUN: 10 mg/dL (ref 6–20)
CO2: 28 mmol/L (ref 22–32)
Calcium: 9.1 mg/dL (ref 8.9–10.3)
Chloride: 103 mmol/L (ref 98–111)
Creatinine, Ser: 0.76 mg/dL (ref 0.44–1.00)
GFR, Estimated: >60 mL/min (ref >60)
Glucose, Bld: 88 mg/dL (ref 70–99)
Potassium: 3.9 mmol/L (ref 3.5–5.1)
Sodium: 138 mmol/L (ref 135–145)
Total Bilirubin: 0.6 mg/dL (ref 0.3–1.2)
Total Protein: 6.6 g/dL (ref 6.5–8.1)

## 2022-07-29 LAB — CBC WITH DIFFERENTIAL/PLATELET
Abs Immature Granulocytes: 0.02 10*3/uL (ref 0.00–0.07)
Basophils Absolute: 0.1 10*3/uL (ref 0.0–0.1)
Basophils Relative: 1 %
Eosinophils Absolute: 0.2 10*3/uL (ref 0.0–0.5)
Eosinophils Relative: 2 %
HCT: 38.9 % (ref 36.0–46.0)
Hemoglobin: 12.4 g/dL (ref 12.0–15.0)
Immature Granulocytes: 0 %
Lymphocytes Relative: 35 %
Lymphs Abs: 2.8 10*3/uL (ref 0.7–4.0)
MCH: 27.4 pg (ref 26.0–34.0)
MCHC: 31.9 g/dL (ref 30.0–36.0)
MCV: 86.1 fL (ref 80.0–100.0)
Monocytes Absolute: 0.5 10*3/uL (ref 0.1–1.0)
Monocytes Relative: 6 %
Neutro Abs: 4.4 10*3/uL (ref 1.7–7.7)
Neutrophils Relative %: 56 %
Platelets: 300 10*3/uL (ref 150–400)
RBC: 4.52 MIL/uL (ref 3.87–5.11)
RDW: 12.9 % (ref 11.5–15.5)
WBC: 7.8 10*3/uL (ref 4.0–10.5)
nRBC: 0 % (ref 0.0–0.2)

## 2022-07-29 LAB — PREGNANCY, URINE: Preg Test, Ur: NEGATIVE

## 2022-07-29 LAB — LIPASE, BLOOD: Lipase: 23 U/L (ref 11–51)

## 2022-07-29 MED ORDER — IOHEXOL 300 MG/ML  SOLN
100.0000 mL | Freq: Once | INTRAMUSCULAR | Status: AC | PRN
  Administered 2022-07-29: 80 mL via INTRAVENOUS

## 2022-07-29 NOTE — ED Notes (Signed)
Patient transported to CT 

## 2022-07-29 NOTE — Discharge Instructions (Signed)
As discussed, workup today overall reassuring.  No abnormality appreciated on ultrasound, CT abdomen pelvis.  Unsure of exactly what is causing your abdominal pain but it does not look life-threatening/emergent.  Recommend follow-up with primary care for reassessment of your symptoms.  Please do not hesitate to return to emergency department for worrisome signs and symptoms we discussed become apparent.

## 2022-07-29 NOTE — ED Provider Notes (Signed)
[facility] EMERGENCY DEPARTMENT AT [facility] Provider Note   CSN: [id number] Arrival date & time: 07/29/22  0941     History  Chief Complaint  Patient presents with   Flank Pain    Right    Abdominal Pain    Kathy [name] is a 32 y.o. female.   Flank Pain Associated symptoms include abdominal pain.  Abdominal Pain   32 year old female presents emergency department with complaints of abdominal pain.  Patient states that she has had intermittent right upper abdominal pain for the past 12 years.  States she has ultrasounds and other imaging of affected area with no evidence of acute abnormality.  States she was referred to PT and was diagnosed with "a lump."  States current symptoms worsened 3 days ago.  Reports some radiation to right infrascapular region.  Denies any known exacerbating or relieving factors.  Has taken no medication for this.  Reports feelings of nausea.  Denies emesis, chest pain, shortness of breath, urinary symptoms, change in bowel habits.  End of last menstrual period on 4/22  Past medical history significant for bipolar 1 disorder, HSV 1, headache, interstitial cystitis, dysthymic disorder, orthostatic hypotension, seizure  Home Medications Prior to Admission medications   Medication Sig Start Date End Date Taking? Authorizing Provider  albuterol (VENTOLIN HFA) 108 (90 Base) MCG/ACT inhaler Inhale into the lungs. 03/26/20   [provider]  ferrous sulfate 325 (65 FE) MG tablet Take 325 mg by mouth daily with breakfast.    [provider]  Fexofenadine HCl (ALLEGRA ALLERGY PO) Take 1 tablet by mouth daily as needed (allergies).    [provider]  levETIRAcetam (KEPPRA) 250 MG tablet Take 1 tablet (250 mg total) by mouth 2 (two) times daily. 11/24/21   Van Clines, MD  MAGNESIUM PO Take by mouth.    [provider]  vitamin B-12 (CYANOCOBALAMIN) 250 MCG tablet Take 250 mcg by mouth daily.     [provider]      Allergies    Diflucan [fluconazole], Latex, and Zonegran [zonisamide]    Review of Systems   Review of Systems  Gastrointestinal:  Positive for abdominal pain.  Genitourinary:  Positive for flank pain.  All other systems reviewed and are negative.   Physical Exam Updated Vital Signs BP 98/60 (BP Location: Right Arm)   Pulse (!) 58   Temp 98.5 F (36.9 C) (Oral)   Resp 16   Ht 5\' 5"  (1.651 m)   Wt 88.5 kg   SpO2 100%   BMI 32.45 kg/m  Physical Exam Vitals and nursing note reviewed.  Constitutional:      General: She is not in acute distress.    Appearance: She is well-developed.  HENT:     Head: Normocephalic and atraumatic.  Eyes:     Conjunctiva/sclera: Conjunctivae normal.  Cardiovascular:     Rate and Rhythm: Normal rate and regular rhythm.     Heart sounds: No murmur heard. Pulmonary:     Effort: Pulmonary effort is normal. No respiratory distress.     Breath sounds: Normal breath sounds.  Abdominal:     Palpations: Abdomen is soft.     Tenderness: There is abdominal tenderness. There is no right CVA tenderness or left CVA tenderness. Positive signs include Murphy's sign.  Musculoskeletal:        General: No swelling.     Cervical back: Neck supple.  Skin:    General: Skin is warm and dry.  Capillary Refill: Capillary refill takes less than 2 seconds.  Neurological:     Mental Status: She is alert.  Psychiatric:        Mood and Affect: Mood normal.     ED Results / Procedures / Treatments   Labs (all labs ordered are listed, but only abnormal results are displayed) Labs Reviewed  COMPREHENSIVE METABOLIC PANEL - Abnormal; Notable for the following components:      Result Value   AST 14 (*)    All other components within normal limits  URINALYSIS, ROUTINE W REFLEX MICROSCOPIC - Abnormal; Notable for the following components:   Protein, ur TRACE (*)    Leukocytes,Ua SMALL (*)    All other components within normal  limits  LIPASE, BLOOD  PREGNANCY, URINE  CBC WITH DIFFERENTIAL/PLATELET    EKG None  Radiology CT ABDOMEN PELVIS W CONTRAST  Result Date: 07/29/2022 CLINICAL DATA:  Right anterior/side abdominal pain. EXAM: CT ABDOMEN AND PELVIS WITH CONTRAST TECHNIQUE: Multidetector CT imaging of the abdomen and pelvis was performed using the standard protocol following bolus administration of intravenous contrast. RADIATION DOSE REDUCTION: This exam was performed according to the departmental dose-optimization program which includes automated exposure control, adjustment of the mA and/or kV according to patient size and/or use of iterative reconstruction technique. CONTRAST:  80mL OMNIPAQUE IOHEXOL 300 MG/ML  SOLN COMPARISON:  CT abdomen/pelvis 11/03/2016 and same-day abdominal ultrasound. FINDINGS: Lower chest: The lung bases are clear. The imaged heart is unremarkable. Hepatobiliary: Liver and gallbladder are unremarkable. There is no biliary ductal dilatation. Pancreas: Unremarkable. Spleen: Unremarkable. Adrenals/Urinary Tract: The adrenals are unremarkable. The kidneys are unremarkable, with no focal lesion, stone, hydronephrosis, or hydroureter. The bladder is unremarkable. Stomach/Bowel: The stomach is unremarkable. There is no evidence of bowel obstruction. There is no abnormal bowel wall thickening or inflammatory change. Vascular/Lymphatic: The abdominal aorta is normal in course and caliber. The main portal and splenic veins are patent. There is no abdominopelvic lymphadenopathy. Reproductive: The uterus is heterogeneous likely reflecting multiple fibroids. There is no adnexal mass. Other: There is no ascites or free air. There is no abdominal wall hernia. Musculoskeletal: There is no acute osseous abnormality or suspicious osseous lesion. IMPRESSION: 1. Acute finding in the abdomen or pelvis. No abdominal wall hernia. 2. Probable fibroid uterus. Electronically Signed   By: Lesia Hausen M.D.   On: 07/29/2022  12:47   US Abdomen Limited RUQ (LIVER/GB)  Result Date: 07/29/2022 CLINICAL DATA:  Right upper quadrant and back pain EXAM: ULTRASOUND ABDOMEN LIMITED RIGHT UPPER QUADRANT COMPARISON:  11/03/2016 CT without contrast FINDINGS: Gallbladder: No gallstones or wall thickening visualized. No sonographic Murphy sign noted by sonographer. Common bile duct: Diameter: 4 mm Liver: No focal lesion identified. Within normal limits in parenchymal echogenicity. Portal vein is patent on color Doppler imaging with normal direction of blood flow towards the liver. Other: No free fluid or ascites IMPRESSION: Normal right upper quadrant ultrasound. Electronically Signed   By: Judie Petit.  Shick M.D.   On: 07/29/2022 12:07    Procedures Procedures    Medications Ordered in ED Medications  iohexol (OMNIPAQUE) 300 MG/ML solution 100 mL (80 mLs Intravenous Contrast Given 07/29/22 1221)    ED Course/ Medical Decision Making/ A&P                             Medical Decision Making Amount and/or Complexity of Data Reviewed Labs: ordered. Radiology: ordered.  Risk Prescription drug management.  This patient presents to the ED for concern of abdominal pain, this involves an extensive number of treatment options, and is a complaint that carries with it a high risk of complications and morbidity.  The differential diagnosis includes gastritis, pancreatitis, CBD pathology, cholecystitis, hepatitis, SBO/LBO, volvulus, diverticulitis, appendicitis, pyelonephritis, nephrolithiasis, cystitis, Tubo-ovarian abscess, PID, ectopic pregnancy, ovarian torsion   Co morbidities that complicate the patient evaluation  See HPI   Additional history obtained:  Additional history obtained from EMR External records from outside source obtained and reviewed including hospital records   Lab Tests:  I Ordered, and personally interpreted labs.  The pertinent results include: No leukocytosis noted.  No evidence of anemia.  Platelets  within normal range.  No electrolyte imbalance.  No transaminitis.  No renal dysfunction.  Lipase within normal limits.  Urine pregnancy negative.  UA significant for small leukocytes, trace proteins but otherwise unremarkable.   Imaging Studies ordered:  I ordered imaging studies including right upper quadrant ultrasound, CT abdomen pelvis I independently visualized and interpreted imaging which showed  Right upper quadrant ultrasound: No acute maladies CT abdomen pelvis: No acute abnormalities.  Probable fibroid uterus I agree with the radiologist interpretation   Cardiac Monitoring: / EKG:  The patient was maintained on a cardiac monitor.  I personally viewed and interpreted the cardiac monitored which showed an underlying rhythm of: Sinus rhythm   Consultations Obtained:  N/a   Problem List / ED Course / Critical interventions / Medication management  Right upper abdominal pain Reevaluation of the patient showed that the patient stayed the same I have reviewed the patients home medicines and have made adjustments as needed   Social Determinants of Health:  Former cigarette use.  Denies illicit drug use   Test / Admission - Considered:  Right upper abdominal pain Vitals signs within normal range and stable throughout visit. Laboratory/imaging studies significant for: See above 32 year old female presents with acute on chronic right upper abdominal pain.  Patient with overall reassuring workup.  No evidence of acute abnormality appreciated on right upper quadrant ultrasound or on CT imaging of the abdomen/pelvis.  Patient without appreciable abnormality on laboratory studies regarding right upper abdominal pain.  Patient reassured by overall workup.  Possible musculoskeletal etiology of patient's abdominal pain given improvement of symptoms with physical therapy and dry needling per patient.  Patient recommended anti-inflammatory medication the form of ibuprofen for abdominal  pain and close follow-up with primary care for reassessment.  Would also recommend physical therapy since she is found relief in the past from send intervention.  Patient overall well-appearing, afebrile, tolerating p.o. without difficulty.  Treatment plan discussed at length with patient and she acknowledged understanding was agreeable to said plan. Worrisome signs and symptoms were discussed with the patient, and the patient acknowledged understanding to return to the ED if noticed. Patient was stable upon discharge.          Final Clinical Impression(s) / ED Diagnoses Final diagnoses:  Right upper quadrant abdominal pain    Rx / DC Orders ED Discharge Orders     None         Peter Garter, [place] 07/29/22 1722    Arby Barrette, MD 08/08/22 1939

## 2022-07-29 NOTE — ED Triage Notes (Signed)
Patient arrives with complaints of radiating right side abdominal pain, going into her back and shoulder. Patient states that this is a chronic issue, but over the past few days the pain has worsened. States that she was diagnoses with a "lump", no specific diagnosis.

## 2022-08-15 ENCOUNTER — Ambulatory Visit: Payer: Medicaid Other | Admitting: Neurology

## 2022-08-15 ENCOUNTER — Encounter: Payer: Self-pay | Admitting: Neurology

## 2022-08-15 VITALS — BP 119/67 | HR 71 | Ht 65.0 in | Wt 197.8 lb

## 2022-08-15 DIAGNOSIS — G40B09 Juvenile myoclonic epilepsy, not intractable, without status epilepticus: Secondary | ICD-10-CM

## 2022-08-15 MED ORDER — LEVETIRACETAM 250 MG PO TABS: 250.0000 mg | ORAL_TABLET | Freq: Two times a day (BID) | ORAL | 3 refills | Status: DC

## 2022-08-15 NOTE — Progress Notes (Signed)
Medication Samples have been provided to the patient.  Drug name: vimpat       Strength: 50        Qty: mg  LOT: [id number]  Exp.Date: 2/26  Dosing instructions: take as directed   The patient has been instructed regarding the correct time, dose, and frequency of taking this medication, including desired effects and most common side effects.

## 2022-08-15 NOTE — Progress Notes (Signed)
NEUROLOGY FOLLOW UP OFFICE NOTE  Kathy [name] [id number] June 09, 1990  HISTORY OF PRESENT ILLNESS: I had the pleasure of seeing Kathy [name] in follow-up in the neurology clinic on 08/15/2022.  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 She is alone in the office today. Records and images were personally reviewed where available. She has opted to continue on low dose Levetiracetam 250mg  BID. No major seizures since 07/2020. Since her last visit, she reports that when there are weather changes, poor sleep, on her period, she gets "spacey." There are little moments she feels off balance with little muscle spasms. There are infrequent episodes where she gets very shaky, usually when she has not eaten, then feels very tired after. Last episode was 2-3 months ago. She notes coconut water helps a lot. She reports being really fatigued all the time. She does not sleep well with her 33 year old daughter co-sleeping. She continues to have difficulties losing weight despite exercising and counting calories. No falls.   History on Initial Assessment 04/06/2017: This is a 32 year old right-handed woman with a history of bipolar disorder, depression, anxiety, ADHD, presenting after hospital admission last 03/11/2017. She recalls having back issues in the beginning of December and saw a chiropractor for adjustment. The next day, she felt lethargic and did not eat much. She recalls feeling anxious, then went to [place] for Christmas shopping with her children. She started to have what she considers as a panic attack, she felt her left ear starting to go numb and she felt herself listing to her left side and feel dizzy. She started getting more anxious and "freaking out" with palpitations, drove to her mother's house 2 miles away. Her mother called EMS, and when they arrived she was in the middle of a panic attack and reports her muscles on the left arm started to spasm.  She shows a video on her phone that she took herself, she reports she was awake and alert, and could feel the muscles tightening at her upper back then the left arm jerking would start. She reports being given a muscle relaxant in the hospital, and the jerking stopped. She was evaluated by Neurology during her admission, she reported left-sided weakness and occipital headaches, as well as arm spasms. she had a normal MRI brain and cervical spine without contrast which I personally reviewed. Due to the left arm spasms, she had an EEG done, which was abnormal showing occasional 2-4 second bursts of generalized high voltage 4 Hz spike and polyspike and wave discharges without clinical correlate. She was started on Keppra the day prior to hospital discharge, and reports the muscle relaxant was stopped. She came back to the ER that evening because she noticed increased pain and left arm spasms. She was given Flexeril and states this helped again with the spasms. She saw PT once since hospital discharge due to left-sided weakness, and feels her strength is better. She states that she has not had any further jerks/spasms since she went back to the ER and restarted the Flexeril. She stopped having the back pain, no further headaches, and stopped the muscle relaxant after a week, with no recurrence of symptoms. She self-tapered Keppra to 250mg  BID after 3 days because it made her very moody and mean. She started feeling a little anxious again the other day when she felt overstimulated at a children's party, but was able to calm herself down with no progression of symptoms. She  has a diagnosis of ADHD and anxiety but does not want to take any medications.   She and her mother deny any staring/unresponsive episodes, gaps in time, olfactory/gustatory hallucinations, focal numbness/tingling/weakness, early morning myoclonus. She denies any further headaches, dizziness, diplopia, dysarthria/dysphagia, bowel dysfunction. She has  interstitial cystitis. She was previously seen by one of my partners, Dr. Everlena Cooper in 2015 when she had left facial numbness, and was diagnosed with migraines. She has not had similar symptoms since. She had vertigo one time related to ear issues. She states she no longer has any neck or back pain.   Epilepsy Risk Factors:  She had a normal birth and early development.  There is no history of febrile convulsions, CNS infections such as meningitis/encephalitis, significant traumatic brain injury, neurosurgical procedures, or family history of seizures.  Diagnostic Data: Repeat EEG in January 2020 again showed frequent generalized high voltage 4 Hz spike and wave discharges lasting 2-5 seconds.  Prior AEDs: Zonisamide, Lamotrigine, Topiramate  PAST MEDICAL HISTORY: Past Medical History:  Diagnosis Date   Asthma    Bipolar 1 disorder (HCC)    Cystitis, interstitial    Depression    better now, situational, had pp with last child   Dysthymic disorder    Frequency of urination    Headache(784.0)    History of abnormal cervical Pap smear    History of ovarian cyst    History of pelvic inflammatory disease    History of suicide attempt    06/ 2012  overdose oxycontin   HSV-1 (herpes simplex virus 1) infection    Nocturia    Orthostatic hypotension    Pelvic pain in female    Seizures (HCC)    Vaginal Pap smear, abnormal    ok since    MEDICATIONS: Current Outpatient Medications on File Prior to Visit  Medication Sig Dispense Refill   albuterol (VENTOLIN HFA) 108 (90 Base) MCG/ACT inhaler Inhale into the lungs.     ferrous sulfate 325 (65 FE) MG tablet Take 325 mg by mouth daily with breakfast.     Fexofenadine HCl (ALLEGRA ALLERGY PO) Take 1 tablet by mouth daily as needed (allergies).     levETIRAcetam (KEPPRA) 250 MG tablet Take 1 tablet (250 mg total) by mouth 2 (two) times daily. 180 tablet 3   vitamin B-12 (CYANOCOBALAMIN) 250 MCG tablet Take 250 mcg by mouth daily.     No  current facility-administered medications on file prior to visit.    ALLERGIES: Allergies  Allergen Reactions   Diflucan [Fluconazole] Itching and Rash   Latex Itching and Rash    redness   Zonegran [Zonisamide] Rash    FAMILY HISTORY: Family History  Adopted: Yes  Problem Relation Age of Onset   Breast cancer Maternal Grandmother    Anesthesia problems Neg Hx     SOCIAL HISTORY: Social History   Socioeconomic History   Marital status: Single    Spouse name: Not on file   Number of children: 3   Years of education: Not on file   Highest education level: Not on file  Occupational History   Not on file  Tobacco Use   Smoking status: Former    Packs/day: 0.25    Years: 0.30    Additional pack years: 0.00    Total pack years: 0.08    Types: Cigarettes    Quit date: 03/28/2014    Years since quitting: 8.3   Smokeless tobacco: Never  Vaping Use   Vaping Use: Never used  Substance and Sexual Activity   Alcohol use: No    Comment: Sober a month ago   Drug use: No   Sexual activity: Yes    Partners: Male    Birth control/protection: None  Other Topics Concern   Not on file  Social History Narrative   Right handed      Highest level of edu- college      Two story home. Has three children   Social Determinants of Health   Financial Resource Strain: Not on file  Food Insecurity: Not on file  Transportation Needs: Not on file  Physical Activity: Not on file  Stress: Not on file  Social Connections: Unknown (03/12/2017)   Social Connection and Isolation Panel [NHANES]    Frequency of Communication with Friends and Family: More than three times a week    Frequency of Social Gatherings with Friends and Family: More than three times a week    Attends Religious Services: More than 4 times per year    Active Member of [employer] or Organizations: Yes    Attends Engineer, structural: More than 4 times per year    Marital Status: Not on file  Intimate Partner  Violence: Unknown (03/12/2017)   Humiliation, Afraid, Rape, and Kick questionnaire    Fear of Current or Ex-Partner: Patient declined    Emotionally Abused: Patient declined    Physically Abused: Patient declined    Sexually Abused: Patient declined     PHYSICAL EXAM: Vitals:   08/15/22 1056  BP: 119/67  Pulse: 71  SpO2: 98%   General: No acute distress Head:  Normocephalic/atraumatic Skin/Extremities: No rash, no edema Neurological Exam: alert and awake. No aphasia or dysarthria. Fund of knowledge is appropriate.  Attention and concentration are normal.   Cranial nerves: Pupils equal, round. Extraocular movements intact with no nystagmus. Visual fields full.  No facial asymmetry.  Motor: Bulk and tone normal, muscle strength 5/5 throughout with no pronator drift.   Finger to nose testing intact.  Gait narrow-based and steady, able to tandem walk adequately.  Romberg negative. No myoclonic jerks.   IMPRESSION: This is a 32 yo RH woman with a history of ADHD, anxiety, bipolar disorder, depression, with Juvenile Myoclonic Epilepsy. Her last seizure in May 2022 had some focal features with right hand jerking and ?dystonic posturing however repeat EEGs have shown generalized high voltage 4 Hz spike and polyspike and wave discharges seen with generalized epilepsy. MRI brain normal. No bigger events since 07/2020, but she still has times where she has more myoclonic jerks or feels spacey. She had side effects on higher dose of Levetiracetam. There are other alternatives for primary generalized epilepsy, including Lacosamide or Fycompa. She is agreeable to trying samples of low dose Vimpat 50mg  1/2 tablet twice a day. Side effects discussed, she will let us know if no side effects and potentially we can increase at least to 50mg  BID and wean off Levetiracetam 250mg  BID. She is aware of [place] driving laws to stop driving after a seizure until 6 months seizure-free. Follow-up in 6-8 months, or earlier if  needed.   Thank you for allowing me to participate in her care.  Please do not hesitate to call for any questions or concerns.    Patrcia Dolly, M.D.   CC: Mady Gemma, PA-C

## 2022-08-15 NOTE — Patient Instructions (Addendum)
Good to see you.  Continue Keppra 250mg  twice a day  2. You can try the Vimpat 50mg : take 1/2 tablet twice a day and see how you feel. If no issues, please send me a message on MyChart and we can talk about switching on an earlier follow-up. Otherwise, follow-up in 6-8 months  Seizure Precautions: 1. If medication has been prescribed for you to prevent seizures, take it exactly as directed.  Do not stop taking the medicine without talking to your doctor first, even if you have not had a seizure in a long time.   2. Avoid activities in which a seizure would cause danger to yourself or to others.  Don't operate dangerous machinery, swim alone, or climb in high or dangerous places, such as on ladders, roofs, or girders.  Do not drive unless your doctor says you may.  3. If you have any warning that you may have a seizure, lay down in a safe place where you can't hurt yourself.    4.  No driving for 6 months from last seizure, as per [facility]   Please refer to the following link on the Epilepsy Foundation of America's website for more information: http://www.epilepsyfoundation.org/answerplace/Social/driving/drivingu.cfm   5.  Maintain good sleep hygiene. Avoid alcohol.  6.  Notify your neurology if you are planning pregnancy or if you become pregnant.  7.  Contact your doctor if you have any problems that may be related to the medicine you are taking.  8.  Call 911 and bring the patient back to the ED if:        A.  The seizure lasts longer than 5 minutes.       B.  The patient doesn't awaken shortly after the seizure  C.  The patient has new problems such as difficulty seeing, speaking or moving  D.  The patient was injured during the seizure  E.  The patient has a temperature over 102 F (39C)  F.  The patient vomited and now is having trouble breathing

## 2022-11-18 ENCOUNTER — Emergency Department (HOSPITAL_BASED_OUTPATIENT_CLINIC_OR_DEPARTMENT_OTHER): Payer: Medicaid Other

## 2022-11-18 ENCOUNTER — Other Ambulatory Visit: Payer: Self-pay

## 2022-11-18 ENCOUNTER — Emergency Department (HOSPITAL_BASED_OUTPATIENT_CLINIC_OR_DEPARTMENT_OTHER)
Admission: EM | Admit: 2022-11-18 | Discharge: 2022-11-18 | Disposition: A | Discharge status: Home / Self Care | Payer: Medicaid Other | Attending: Emergency Medicine | Admitting: Emergency Medicine

## 2022-11-18 DIAGNOSIS — Z1152 Encounter for screening for COVID-19: Secondary | ICD-10-CM | POA: Diagnosis not present

## 2022-11-18 DIAGNOSIS — Z91040 Latex allergy status: Secondary | ICD-10-CM | POA: Insufficient documentation

## 2022-11-18 DIAGNOSIS — R059 Cough, unspecified: Secondary | ICD-10-CM | POA: Diagnosis present

## 2022-11-18 DIAGNOSIS — J4531 Mild persistent asthma with (acute) exacerbation: Secondary | ICD-10-CM | POA: Insufficient documentation

## 2022-11-18 DIAGNOSIS — J069 Acute upper respiratory infection, unspecified: Secondary | ICD-10-CM | POA: Diagnosis not present

## 2022-11-18 LAB — RESP PANEL BY RT-PCR (RSV, FLU A&B, COVID)  RVPGX2
Influenza A by PCR: NEGATIVE
Influenza B by PCR: NEGATIVE
Resp Syncytial Virus by PCR: NEGATIVE
SARS Coronavirus 2 by RT PCR: NEGATIVE

## 2022-11-18 MED ORDER — PSEUDOEPHEDRINE HCL 30 MG PO TABS: 30.0000 mg | ORAL_TABLET | ORAL | 0 refills | Status: DC | PRN

## 2022-11-18 MED ORDER — PREDNISONE 10 MG PO TABS: 50.0000 mg | ORAL_TABLET | Freq: Every day | ORAL | 0 refills | Status: DC

## 2022-11-18 MED ORDER — ALBUTEROL SULFATE (2.5 MG/3ML) 0.083% IN NEBU
5.0000 mg | INHALATION_SOLUTION | Freq: Once | RESPIRATORY_TRACT | Status: AC
  Administered 2022-11-18: 5 mg via RESPIRATORY_TRACT
  Filled 2022-11-18: qty 6

## 2022-11-18 MED ORDER — DEXAMETHASONE SODIUM PHOSPHATE 10 MG/ML IJ SOLN
10.0000 mg | Freq: Once | INTRAMUSCULAR | Status: AC
  Administered 2022-11-18: 10 mg via INTRAMUSCULAR
  Filled 2022-11-18: qty 1

## 2022-11-18 MED ORDER — ALBUTEROL SULFATE HFA 108 (90 BASE) MCG/ACT IN AERS: 2.0000 | INHALATION_SPRAY | RESPIRATORY_TRACT | 0 refills | Status: AC | PRN

## 2022-11-18 NOTE — ED Notes (Signed)
Pt discharged to home using teachback Method. Discharge instructions have been discussed with patient and/or family members. Pt verbally acknowledges understanding d/c instructions, has been given opportunity for questions to be answered, and endorses comprehension to checkout at registration before leaving.  

## 2022-11-18 NOTE — ED Triage Notes (Addendum)
Nasal congestion and productively  brown cough x 3 days. Presents today for concern for palpitations, sore throat, shortness of breath, and hoarse voice.  Tested negative for covid yesterday.  Tried her inhaler from previous viral illness without relief.   H/o epilepsy, no missed keppra doses; interstitial cystitis

## 2022-11-18 NOTE — ED Provider Notes (Signed)
[place] EMERGENCY DEPARTMENT AT [facility] Provider Note   CSN: [id number] Arrival date & time: 11/18/22  1914     History  Chief Complaint  Patient presents with   Cough    Kathy [name] is a 32 y.o. female.  HPI    SUBJECTIVE:  Kathy [name] is a 32 y.o. female who complains of congestion, sore throat, post nasal drip, and dry cough for 4-7 days. URI came first then cough for 4 days, now wheezing. Having shob and palpitations.  She denies a history of chills and fevers and has a history of asthma. Patient denies smoke cigarettes.   Home Medications Prior to Admission medications   Medication Sig Start Date End Date Taking? Authorizing Provider  albuterol (VENTOLIN HFA) 108 (90 Base) MCG/ACT inhaler Inhale 2 puffs into the lungs every 4 (four) hours as needed for wheezing or shortness of breath. 11/18/22  Yes Baylea Milburn, MD  predniSONE (DELTASONE) 10 MG tablet Take 5 tablets (50 mg total) by mouth daily. 11/19/22  Yes Derwood Kaplan, MD  pseudoephedrine (SUDAFED) 30 MG tablet Take 1 tablet (30 mg total) by mouth every 4 (four) hours as needed for congestion. 11/18/22  Yes Derwood Kaplan, MD  albuterol (VENTOLIN HFA) 108 (90 Base) MCG/ACT inhaler Inhale into the lungs. 03/26/20   [provider]  ferrous sulfate 325 (65 FE) MG tablet Take 325 mg by mouth daily with breakfast.    [provider]  Fexofenadine HCl (ALLEGRA ALLERGY PO) Take 1 tablet by mouth daily as needed (allergies).    [provider]  levETIRAcetam (KEPPRA) 250 MG tablet Take 1 tablet (250 mg total) by mouth 2 (two) times daily. 08/15/22   Van Clines, MD  vitamin B-12 (CYANOCOBALAMIN) 250 MCG tablet Take 250 mcg by mouth daily.    [provider]      Allergies    Diflucan [fluconazole], Latex, and Zonegran [zonisamide]    Review of Systems   Review of Systems  All other systems reviewed and are  negative.   Physical Exam Updated Vital Signs BP 108/73   Pulse 61   Temp 98 F (36.7 C) (Oral)   Resp 18   Wt 89 kg   LMP 11/03/2022 (Approximate)   SpO2 96%   BMI 32.65 kg/m  Physical Exam Vitals and nursing note reviewed.  Constitutional:      Appearance: She is well-developed.  HENT:     Head: Atraumatic.  Cardiovascular:     Rate and Rhythm: Normal rate.  Pulmonary:     Effort: Pulmonary effort is normal.     Breath sounds: No wheezing or rhonchi.  Musculoskeletal:     Cervical back: Normal range of motion and neck supple.  Skin:    General: Skin is warm and dry.  Neurological:     Mental Status: She is alert and oriented to person, place, and time.     ED Results / Procedures / Treatments   Labs (all labs ordered are listed, but only abnormal results are displayed) Labs Reviewed  RESP PANEL BY RT-PCR (RSV, FLU A&B, COVID)  [id number]    EKG None  Radiology DG Chest Portable 1 View  Result Date: 11/18/2022 CLINICAL DATA:  32 year old female with history of cough and nasal congestion. EXAM: PORTABLE CHEST 1 VIEW COMPARISON:  Chest x-ray 05/23/2010. FINDINGS: Lung volumes are normal. No consolidative airspace disease. No pleural effusions. No pneumothorax. No pulmonary nodule or mass noted. Pulmonary vasculature and the cardiomediastinal  silhouette are within normal limits. IMPRESSION: No radiographic evidence of acute cardiopulmonary disease. Electronically Signed   By: Trudie Reed M.D.   On: 11/18/2022 07:03    Procedures Procedures    Medications Ordered in ED Medications  dexamethasone (DECADRON) injection 10 mg (has no administration in time range)  albuterol (PROVENTIL) (2.5 MG/3ML) 0.083% nebulizer solution 5 mg (5 mg Nebulization Given 11/18/22 0743)    ED Course/ Medical Decision Making/ A&P Clinical Course as of 11/18/22 0921  Fri Nov 18, 2022  0920 Resp panel by RT-PCR (RSV, Flu A&B, Covid) Anterior Nasal Swab Patient's respiratory panel  is negative. Additionally, patient's chest x-ray was independently interpreted.  There is no evidence of pneumonia.  On reassessment she indicates that she is feeling a lot better after the nebulizer treatment.  Last night, the congestion was making her respirations worse and probably also making her cough worse.  We discussed OTC medications to manage her symptoms.  Instead of Mucinex, have advised her to take Sudafed.  Results of the ED workup discussed with the patient.  She is stable for discharge. [AN]    Clinical Course User Index [AN] Derwood Kaplan, MD                                 Medical Decision Making Risk OTC drugs. Prescription drug management.    OBJECTIVE: She appears well, vital signs are as noted. Ears normal.  Throat and pharynx normal.  Neck supple. No adenopathy in the neck. Nose is congested. Sinuses non tender. The chest is clear, without wheezes or rales.  ASSESSMENT:  viral upper respiratory illness  PLAN: Symptomatic therapy suggested: push fluids, rest, use antihistamine-decongestant of choice, cough suppressant of choice prn, return office visit prn if symptoms persist or worsen, and we will treat for asthma exacerbation. Lack of antibiotic effectiveness discussed with her. Call or return to clinic prn if these symptoms worsen or fail to improve as anticipated.  Final Clinical Impression(s) / ED Diagnoses Final diagnoses:  Mild persistent asthma with acute exacerbation  Viral URI with cough    Rx / DC Orders ED Discharge Orders          Ordered    predniSONE (DELTASONE) 10 MG tablet  Daily        11/18/22 0906    pseudoephedrine (SUDAFED) 30 MG tablet  Every 4 hours PRN        11/18/22 0906    albuterol (VENTOLIN HFA) 108 (90 Base) MCG/ACT inhaler  Every 4 hours PRN        11/18/22 0907              Derwood Kaplan, MD 11/18/22 [phone]

## 2022-11-18 NOTE — Discharge Instructions (Signed)
You were seen in the emergency room with chief complaint of cough, congestion and chest tightness.  Your COVID-19 and flu test is negative.  Your x-ray does not show any pneumonia.  We suspect that you are having a viral upper respiratory infection with cough and resultant asthma exacerbation.  Take the medications that are prescribed. Mucinex is great at breaking significant mucus buildup.  Sudafed reduces congestion in first place, therefore we will prescribe Sudafed.  Do not take both Sudafed and Mucinex.

## 2023-03-14 ENCOUNTER — Ambulatory Visit: Payer: Medicaid Other | Admitting: Neurology

## 2023-03-14 ENCOUNTER — Encounter: Payer: Self-pay | Admitting: Neurology

## 2023-03-14 VITALS — BP 107/71 | HR 63 | Ht 65.0 in | Wt 212.8 lb

## 2023-03-14 DIAGNOSIS — G40B09 Juvenile myoclonic epilepsy, not intractable, without status epilepticus: Secondary | ICD-10-CM | POA: Diagnosis not present

## 2023-03-14 MED ORDER — LEVETIRACETAM 250 MG PO TABS: 250.0000 mg | ORAL_TABLET | Freq: Two times a day (BID) | ORAL | 3 refills | Status: DC

## 2023-03-14 NOTE — Progress Notes (Signed)
NEUROLOGY FOLLOW UP OFFICE NOTE  Kathy [name] [id number] 01-11-1991  HISTORY OF PRESENT ILLNESS: I had the pleasure of seeing Kathy [name] in follow-up in the neurology clinic on 03/14/2023.  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 She is alone in the office today. Records and images were personally reviewed where available.  On her last visit, she reported episodes of feeling spacey and some myoclonic jerks on current regimen of low dose Levetiracetam 250mg  BID. She had side effects on higher dose. She was given samples of Lacosamide to try, however she did not take them due to concern for starting a different medication while she is overall stable on her regimen. She denies any major seizures since 07/2020. She has not had any myoclonic jerks. Sometimes there are little tiny muscle twitches in different body parts. There are very brief periods where she feels herself staring or her kids mention it to her, like a "blip" happens. They mostly occur at the end of the day when she is tired. There was one incident a few weeks ago when she only had 4 hours of sleep and had a long day, she "zoned out" and could not form sentences, went to sleep briefly, then woke up feeling fine. She denies any headaches, dizziness, vision changes, no falls. Mood is okay. 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    History on Initial Assessment 04/06/2017: This is a 32 year old right-handed woman with a history of bipolar disorder, depression, anxiety, ADHD, presenting after hospital admission last 03/11/2017. She recalls having back issues in the beginning of December and saw a chiropractor for adjustment. The next day, she felt lethargic and did not eat much. She recalls feeling anxious, then went to [place] for Christmas shopping with her children. She started to have what she considers as a panic attack, she felt her left ear starting to go numb and she felt herself  listing to her left side and feel dizzy. She started getting more anxious and "freaking out" with palpitations, drove to her mother's house 2 miles away. Her mother called EMS, and when they arrived she was in the middle of a panic attack and reports her muscles on the left arm started to spasm. She shows a video on her phone that she took herself, she reports she was awake and alert, and could feel the muscles tightening at her upper back then the left arm jerking would start. She reports being given a muscle relaxant in the hospital, and the jerking stopped. She was evaluated by Neurology during her admission, she reported left-sided weakness and occipital headaches, as well as arm spasms. she had a normal MRI brain and cervical spine without contrast which I personally reviewed. Due to the left arm spasms, she had an EEG done, which was abnormal showing occasional 2-4 second bursts of generalized high voltage 4 Hz spike and polyspike and wave discharges without clinical correlate. She was started on Keppra the day prior to hospital discharge, and reports the muscle relaxant was stopped. She came back to the ER that evening because she noticed increased pain and left arm spasms. She was given Flexeril and states this helped again with the spasms. She saw PT once since hospital discharge due to left-sided weakness, and feels her strength is better. She states that she has not had any further jerks/spasms since she went back to the ER and restarted the Flexeril. She stopped having the back pain, no further  headaches, and stopped the muscle relaxant after a week, with no recurrence of symptoms. She self-tapered Keppra to 250mg  BID after 3 days because it made her very moody and mean. She started feeling a little anxious again the other day when she felt overstimulated at a children's party, but was able to calm herself down with no progression of symptoms. She has a diagnosis of ADHD and anxiety but does not want to  take any medications.   She and her mother deny any staring/unresponsive episodes, gaps in time, olfactory/gustatory hallucinations, focal numbness/tingling/weakness, early morning myoclonus. She denies any further headaches, dizziness, diplopia, dysarthria/dysphagia, bowel dysfunction. She has interstitial cystitis. She was previously seen by one of my partners, Dr. Everlena Cooper in 2015 when she had left facial numbness, and was diagnosed with migraines. She has not had similar symptoms since. She had vertigo one time related to ear issues. She states she no longer has any neck or back pain.   Epilepsy Risk Factors:  She had a normal birth and early development.  There is no history of febrile convulsions, CNS infections such as meningitis/encephalitis, significant traumatic brain injury, neurosurgical procedures, or family history of seizures.  Diagnostic Data: Repeat EEG in January 2020 again showed frequent generalized high voltage 4 Hz spike and wave discharges lasting 2-5 seconds.  Prior AEDs: Zonisamide, Lamotrigine, Topiramate  PAST MEDICAL HISTORY: Past Medical History:  Diagnosis Date   Asthma    Bipolar 1 disorder (HCC)    Cystitis, interstitial    Depression    better now, situational, had pp with last child   Dysthymic disorder    Frequency of urination    Headache(784.0)    History of abnormal cervical Pap smear    History of ovarian cyst    History of pelvic inflammatory disease    History of suicide attempt    06/ 2012  overdose oxycontin   HSV-1 (herpes simplex virus 1) infection    Nocturia    Orthostatic hypotension    Pelvic pain in female    Seizures (HCC)    Vaginal Pap smear, abnormal    ok since    MEDICATIONS: Current Outpatient Medications on File Prior to Visit  Medication Sig Dispense Refill   albuterol (VENTOLIN HFA) 108 (90 Base) MCG/ACT inhaler Inhale into the lungs.     albuterol (VENTOLIN HFA) 108 (90 Base) MCG/ACT inhaler Inhale 2 puffs into the  lungs every 4 (four) hours as needed for wheezing or shortness of breath. 1 each 0   ferrous sulfate 325 (65 FE) MG tablet Take 325 mg by mouth daily with breakfast.     Fexofenadine HCl (ALLEGRA ALLERGY PO) Take 1 tablet by mouth daily as needed (allergies).     levETIRAcetam (KEPPRA) 250 MG tablet Take 1 tablet (250 mg total) by mouth 2 (two) times daily. 180 tablet 3   vitamin B-12 (CYANOCOBALAMIN) 250 MCG tablet Take 250 mcg by mouth daily.     pseudoephedrine (SUDAFED) 30 MG tablet Take 1 tablet (30 mg total) by mouth every 4 (four) hours as needed for congestion. (Patient not taking: Reported on 03/14/2023) 30 tablet 0   No current facility-administered medications on file prior to visit.    ALLERGIES: Allergies  Allergen Reactions   Diflucan [Fluconazole] Itching and Rash   Latex Itching and Rash    redness   Zonegran [Zonisamide] Rash    FAMILY HISTORY: Family History  Adopted: Yes  Problem Relation Age of Onset   Breast cancer Maternal Grandmother  Anesthesia problems Neg Hx     SOCIAL HISTORY: Social History   Socioeconomic History   Marital status: Single    Spouse name: Not on file   Number of children: 3   Years of education: Not on file   Highest education level: Not on file  Occupational History   Not on file  Tobacco Use   Smoking status: Former    Current packs/day: 0.00    Average packs/day: 0.3 packs/day for 0.3 years (0.1 ttl pk-yrs)    Types: Cigarettes    Start date: 12/09/2013    Quit date: 03/28/2014    Years since quitting: 8.9   Smokeless tobacco: Never  Vaping Use   Vaping status: Never Used  Substance and Sexual Activity   Alcohol use: No    Comment: Sober a month ago   Drug use: No   Sexual activity: Yes    Partners: Male    Birth control/protection: None  Other Topics Concern   Not on file  Social History Narrative   Right handed      Highest level of edu- college      Two story home. Has three children   Social Drivers of  Health   Financial Resource Strain: Medium Risk (07/25/2022)   Received from [facility], [facility]   Overall Financial Resource Strain (CARDIA)    Difficulty of Paying Living Expenses: Somewhat hard  Food Insecurity: Food Insecurity Present (07/25/2022)   Received from [facility], [facility]   Hunger Vital Sign    Worried About Running Out of Food in the Last Year: Sometimes true    Ran Out of Food in the Last Year: Sometimes true  Transportation Needs: No Transportation Needs (07/25/2022)   Received from [facility], [facility]   PRAPARE - Transportation    Lack of Transportation (Medical): No    Lack of Transportation (Non-Medical): No  Physical Activity: Insufficiently Active (07/25/2022)   Received from [facility], [facility]   Exercise Vital Sign    Days of Exercise per Week: 2 days    Minutes of Exercise per Session: 30 min  Stress: No Stress Concern Present (07/25/2022)   Received from [facility], [facility] - Occupational Stress Questionnaire    Feeling of Stress : Only a little  Social Connections: Socially Isolated (07/25/2022)   Received from [facility], [facility]   Social Network    How would you rate your social network (family, work, friends)?: Little participation, lonely and socially isolated  Intimate Partner Violence: Not At Risk (07/25/2022)   Received from [facility], [facility]   HITS    Over the last 12 months how often did your partner physically hurt you?: Never    Over the last 12 months how often did your partner insult you or talk down to you?: Never    Over the last 12 months how often did your partner threaten you with physical harm?: Never    Over the last 12 months how often did your partner scream or curse at you?: Never     PHYSICAL EXAM: Vitals:   03/14/23 0828  BP: 107/71  Pulse: 63  SpO2: 99%   General: No acute distress Head:   Normocephalic/atraumatic Skin/Extremities: No rash, no edema Neurological Exam: alert and awake. No aphasia or dysarthria. Fund of knowledge is appropriate.  Attention and concentration are normal.   Cranial nerves: Pupils equal, round. Extraocular movements intact with no nystagmus.  Visual fields full.  No facial asymmetry.  Motor: Bulk and tone normal, muscle strength 5/5 throughout with no pronator drift.   Finger to nose testing intact.  Gait narrow-based and steady, able to tandem walk adequately.  Romberg negative.   IMPRESSION: This is a 32 yo RH woman with a history of ADHD, anxiety, bipolar disorder, depression, with Juvenile Myoclonic Epilepsy. Her last seizure in May 2022 had some focal features with right hand jerking and ?dystonic posturing however repeat EEGs have shown generalized high voltage 4 Hz spike and polyspike and wave discharges seen with generalized epilepsy. MRI brain normal. No further bigger events since 07/2020 on low dose Levetiracetam 250mg  BID. She reports very brief "blips" of staring, no myoclonic jerks. She overall feels stable on current regimen and would like to continue on this, refills sent. She is aware of [place] driving laws to stop driving after a seizure until 6 months seizure-free. Follow-up in 8 months, call for any changes.    Thank you for allowing me to participate in her care.  Please do not hesitate to call for any questions or concerns.    Patrcia Dolly, M.D.   CC: Mady Gemma, PA-C

## 2023-03-14 NOTE — Patient Instructions (Signed)
Good to see you. Continue Levetiracetam 250mg  twice a day. Follow-up in 8 months, call for any changes.    Seizure Precautions: 1. If medication has been prescribed for you to prevent seizures, take it exactly as directed.  Do not stop taking the medicine without talking to your doctor first, even if you have not had a seizure in a long time.   2. Avoid activities in which a seizure would cause danger to yourself or to others.  Don't operate dangerous machinery, swim alone, or climb in high or dangerous places, such as on ladders, roofs, or girders.  Do not drive unless your doctor says you may.  3. If you have any warning that you may have a seizure, lay down in a safe place where you can't hurt yourself.    4.  No driving for 6 months from last seizure, as per [facility].   Please refer to the following link on the Epilepsy Foundation of America's website for more information: http://www.epilepsyfoundation.org/answerplace/Social/driving/drivingu.cfm   5.  Maintain good sleep hygiene. Avoid alcohol.  6.  Notify your neurology if you are planning pregnancy or if you become pregnant.  7.  Contact your doctor if you have any problems that may be related to the medicine you are taking.  8.  Call 911 and bring the patient back to the ED if:        A.  The seizure lasts longer than 5 minutes.       B.  The patient doesn't awaken shortly after the seizure  C.  The patient has new problems such as difficulty seeing, speaking or moving  D.  The patient was injured during the seizure  E.  The patient has a temperature over 102 F (39C)  F.  The patient vomited and now is having trouble breathing

## 2023-11-14 ENCOUNTER — Encounter: Payer: Self-pay | Admitting: Neurology

## 2023-11-14 ENCOUNTER — Ambulatory Visit (INDEPENDENT_AMBULATORY_CARE_PROVIDER_SITE_OTHER): Payer: Medicaid Other | Admitting: Neurology

## 2023-11-14 VITALS — BP 101/70 | HR 67 | Ht 65.0 in | Wt 232.0 lb

## 2023-11-14 DIAGNOSIS — G40B09 Juvenile myoclonic epilepsy, not intractable, without status epilepticus: Secondary | ICD-10-CM

## 2023-11-14 MED ORDER — LEVETIRACETAM 250 MG PO TABS: ORAL_TABLET | ORAL | 3 refills | Status: AC

## 2023-11-14 NOTE — Patient Instructions (Addendum)
 Good to see you.  Take the Keppra  250mg : take 1 tablet twice a day. Take additional 1 tablet daily for 7 days around your period  2. Please call the [facility] to schedule [phone]  3. Follow-up in 6-8 months, call for any changes   Seizure Precautions: 1. If medication has been prescribed for you to prevent seizures, take it exactly as directed.  Do not stop taking the medicine without talking to your doctor first, even if you have not had a seizure in a long time.   2. Avoid activities in which a seizure would cause danger to yourself or to others.  Don't operate dangerous machinery, swim alone, or climb in high or dangerous places, such as on ladders, roofs, or girders.  Do not drive unless your doctor says you may.  3. If you have any warning that you may have a seizure, lay down in a safe place where you can't hurt yourself.    4.  No driving for 6 months from last seizure, as per [place]  state law.   Please refer to the following link on the Epilepsy Foundation of America's website for more information: http://www.epilepsyfoundation.org/answerplace/Social/driving/drivingu.cfm   5.  Maintain good sleep hygiene. Avoid alcohol.  6.  Contact your doctor if you have any problems that may be related to the medicine you are taking.  7.  Call 911 and bring the patient back to the ED if:        A.  The seizure lasts longer than 5 minutes.       B.  The patient doesn't awaken shortly after the seizure  C.  The patient has new problems such as difficulty seeing, speaking or moving  D.  The patient was injured during the seizure  E.  The patient has a temperature over 102 F (39C)  F.  The patient vomited and now is having trouble breathing
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
## 2023-12-11 ENCOUNTER — Emergency Department (HOSPITAL_COMMUNITY)

## 2023-12-11 ENCOUNTER — Emergency Department (HOSPITAL_COMMUNITY)
Admission: EM | Admit: 2023-12-11 | Discharge: 2023-12-11 | Disposition: A | Discharge status: Home / Self Care | Attending: Emergency Medicine | Admitting: Emergency Medicine

## 2023-12-11 DIAGNOSIS — R519 Headache, unspecified: Secondary | ICD-10-CM | POA: Insufficient documentation

## 2023-12-11 DIAGNOSIS — Z91040 Latex allergy status: Secondary | ICD-10-CM | POA: Insufficient documentation

## 2023-12-11 DIAGNOSIS — Y92410 Unspecified street and highway as the place of occurrence of the external cause: Secondary | ICD-10-CM | POA: Insufficient documentation

## 2023-12-11 DIAGNOSIS — R109 Unspecified abdominal pain: Secondary | ICD-10-CM | POA: Insufficient documentation

## 2023-12-11 DIAGNOSIS — M542 Cervicalgia: Secondary | ICD-10-CM | POA: Insufficient documentation

## 2023-12-11 DIAGNOSIS — S20211A Contusion of right front wall of thorax, initial encounter: Secondary | ICD-10-CM | POA: Insufficient documentation

## 2023-12-11 DIAGNOSIS — R079 Chest pain, unspecified: Secondary | ICD-10-CM | POA: Diagnosis present

## 2023-12-11 DIAGNOSIS — J45909 Unspecified asthma, uncomplicated: Secondary | ICD-10-CM | POA: Diagnosis not present

## 2023-12-11 DIAGNOSIS — M25561 Pain in right knee: Secondary | ICD-10-CM | POA: Insufficient documentation

## 2023-12-11 DIAGNOSIS — S060X0A Concussion without loss of consciousness, initial encounter: Secondary | ICD-10-CM

## 2023-12-11 LAB — COMPREHENSIVE METABOLIC PANEL WITH GFR
ALT: 15 U/L (ref 0–44)
AST: 23 U/L (ref 15–41)
Albumin: 3.4 g/dL — ABNORMAL LOW (ref 3.5–5.0)
Alkaline Phosphatase: 49 U/L (ref 38–126)
Anion gap: 9 (ref 5–15)
BUN: 12 mg/dL (ref 6–20)
CO2: 23 mmol/L (ref 22–32)
Calcium: 8.9 mg/dL (ref 8.9–10.3)
Chloride: 104 mmol/L (ref 98–111)
Creatinine, Ser: 0.82 mg/dL (ref 0.44–1.00)
GFR, Estimated: >60 mL/min (ref >60)
Glucose, Bld: 96 mg/dL (ref 70–99)
Potassium: 3.9 mmol/L (ref 3.5–5.1)
Sodium: 136 mmol/L (ref 135–145)
Total Bilirubin: 0.7 mg/dL (ref 0.0–1.2)
Total Protein: 6.6 g/dL (ref 6.5–8.1)

## 2023-12-11 LAB — PROTIME-INR
INR: 0.9 (ref 0.8–1.2)
Prothrombin Time: 12.9 s (ref 11.4–15.2)

## 2023-12-11 LAB — CBC WITH DIFFERENTIAL/PLATELET
Abs Immature Granulocytes: 0.04 K/uL (ref 0.00–0.07)
Basophils Absolute: 0.1 K/uL (ref 0.0–0.1)
Basophils Relative: 1 %
Eosinophils Absolute: 0.4 K/uL (ref 0.0–0.5)
Eosinophils Relative: 4 %
HCT: 38.1 % (ref 36.0–46.0)
Hemoglobin: 11.9 g/dL — ABNORMAL LOW (ref 12.0–15.0)
Immature Granulocytes: 0 %
Lymphocytes Relative: 20 %
Lymphs Abs: 2.1 K/uL (ref 0.7–4.0)
MCH: 26.8 pg (ref 26.0–34.0)
MCHC: 31.2 g/dL (ref 30.0–36.0)
MCV: 85.8 fL (ref 80.0–100.0)
Monocytes Absolute: 0.6 K/uL (ref 0.1–1.0)
Monocytes Relative: 6 %
Neutro Abs: 7.4 K/uL (ref 1.7–7.7)
Neutrophils Relative %: 69 %
Platelets: 331 K/uL (ref 150–400)
RBC: 4.44 MIL/uL (ref 3.87–5.11)
RDW: 13.1 % (ref 11.5–15.5)
WBC: 10.5 K/uL (ref 4.0–10.5)
nRBC: 0 % (ref 0.0–0.2)

## 2023-12-11 LAB — I-STAT CHEM 8, ED
BUN: 13 mg/dL (ref 6–20)
Calcium, Ion: 1.16 mmol/L (ref 1.15–1.40)
Chloride: 104 mmol/L (ref 98–111)
Creatinine, Ser: 0.9 mg/dL (ref 0.44–1.00)
Glucose, Bld: 95 mg/dL (ref 70–99)
HCT: 36 % (ref 36.0–46.0)
Hemoglobin: 12.2 g/dL (ref 12.0–15.0)
Potassium: 3.9 mmol/L (ref 3.5–5.1)
Sodium: 139 mmol/L (ref 135–145)
TCO2: 23 mmol/L (ref 22–32)

## 2023-12-11 LAB — ETHANOL: Alcohol, Ethyl (B): <15 mg/dL (ref <15)

## 2023-12-11 LAB — URINALYSIS, ROUTINE W REFLEX MICROSCOPIC
Bilirubin Urine: NEGATIVE
Glucose, UA: NEGATIVE mg/dL
Ketones, ur: NEGATIVE mg/dL
Nitrite: NEGATIVE
Protein, ur: 100 mg/dL — AB
RBC / HPF: >50 RBC/hpf (ref 0–5)
Specific Gravity, Urine: 1.006 (ref 1.005–1.030)
pH: 5 (ref 5.0–8.0)

## 2023-12-11 LAB — HCG, SERUM, QUALITATIVE: Preg, Serum: NEGATIVE

## 2023-12-11 LAB — I-STAT CG4 LACTIC ACID, ED: Lactic Acid, Venous: 1.2 mmol/L (ref 0.5–1.9)

## 2023-12-11 MED ORDER — IBUPROFEN 400 MG PO TABS: 400.0000 mg | ORAL_TABLET | Freq: Four times a day (QID) | ORAL | 0 refills | Status: AC | PRN

## 2023-12-11 MED ORDER — IOHEXOL 350 MG/ML SOLN
75.0000 mL | Freq: Once | INTRAVENOUS | Status: AC | PRN
  Administered 2023-12-11: 75 mL via INTRAVENOUS

## 2023-12-11 MED ORDER — METHOCARBAMOL 500 MG PO TABS: 500.0000 mg | ORAL_TABLET | Freq: Three times a day (TID) | ORAL | 0 refills | Status: AC | PRN

## 2023-12-11 MED ORDER — FENTANYL CITRATE PF 50 MCG/ML IJ SOSY: 50.0000 ug | PREFILLED_SYRINGE | Freq: Once | INTRAMUSCULAR | Status: DC

## 2023-12-11 MED ORDER — LEVETIRACETAM 250 MG PO TABS
250.0000 mg | ORAL_TABLET | Freq: Two times a day (BID) | ORAL | Status: DC
  Administered 2023-12-11: 250 mg via ORAL
  Filled 2023-12-11: qty 1

## 2023-12-11 MED ORDER — ACETAMINOPHEN 500 MG PO TABS
1000.0000 mg | ORAL_TABLET | Freq: Once | ORAL | Status: AC
  Administered 2023-12-11: 1000 mg via ORAL
  Filled 2023-12-11: qty 2
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
## 2024-01-25 ENCOUNTER — Encounter: Admitting: Family Medicine

## 2024-01-30 ENCOUNTER — Ambulatory Visit (INDEPENDENT_AMBULATORY_CARE_PROVIDER_SITE_OTHER): Admitting: Family Medicine

## 2024-01-30 VITALS — BP 112/60 | HR 89 | Ht 65.0 in | Wt 229.0 lb

## 2024-01-30 DIAGNOSIS — R42 Dizziness and giddiness: Secondary | ICD-10-CM | POA: Diagnosis not present

## 2024-01-30 DIAGNOSIS — R5383 Other fatigue: Secondary | ICD-10-CM | POA: Diagnosis not present

## 2024-01-30 DIAGNOSIS — G43009 Migraine without aura, not intractable, without status migrainosus: Secondary | ICD-10-CM

## 2024-01-30 DIAGNOSIS — F0630 Mood disorder due to known physiological condition, unspecified: Secondary | ICD-10-CM

## 2024-01-30 DIAGNOSIS — F0781 Postconcussional syndrome: Secondary | ICD-10-CM | POA: Diagnosis not present

## 2024-01-30 MED ORDER — NORTRIPTYLINE HCL 25 MG PO CAPS: 25.0000 mg | ORAL_CAPSULE | Freq: Every day | ORAL | 2 refills | Status: DC

## 2024-01-30 MED ORDER — AMPHETAMINE-DEXTROAMPHET ER 10 MG PO CP24: 10.0000 mg | ORAL_CAPSULE | Freq: Every day | ORAL | 0 refills | Status: AC
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
## 2024-01-30 NOTE — Patient Instructions (Addendum)
 Thank you for coming in today.   School note provided today for accommodations.  See you back in 2 weeks.

## 2024-01-31 DIAGNOSIS — G43009 Migraine without aura, not intractable, without status migrainosus: Secondary | ICD-10-CM | POA: Insufficient documentation

## 2024-01-31 DIAGNOSIS — F0781 Postconcussional syndrome: Secondary | ICD-10-CM | POA: Insufficient documentation

## 2024-02-09 ENCOUNTER — Encounter: Payer: Self-pay | Admitting: Neurology

## 2024-02-20 ENCOUNTER — Ambulatory Visit (INDEPENDENT_AMBULATORY_CARE_PROVIDER_SITE_OTHER): Admitting: Family Medicine

## 2024-02-20 VITALS — BP 126/82 | HR 91 | Ht 65.0 in

## 2024-02-20 DIAGNOSIS — F902 Attention-deficit hyperactivity disorder, combined type: Secondary | ICD-10-CM | POA: Diagnosis not present

## 2024-02-20 DIAGNOSIS — R42 Dizziness and giddiness: Secondary | ICD-10-CM

## 2024-02-20 DIAGNOSIS — F0781 Postconcussional syndrome: Secondary | ICD-10-CM

## 2024-02-20 DIAGNOSIS — G43009 Migraine without aura, not intractable, without status migrainosus: Secondary | ICD-10-CM | POA: Diagnosis not present

## 2024-02-20 NOTE — Progress Notes (Unsigned)
 Subjective:   I, Kathy Collet, PhD, LAT, ATC acting as a scribe for Kathy Lloyd, MD.  Chief Complaint: Kathy [name],  is a 33 y.o. female who presents for f/u concussion. Pt was last seen by Dr. Lloyd on 01/30/24 and was prescribed nortriptyline  and Adderall.  Today, pt reports has been taking both rx. HA aren't constant, but variable. She wonders if the HA are related to her epilepsy. She initial felt better taking the Adderall, and finds her brain more calm but then finds herself craving it after a couple days.  Injury date: 12/11/23 Visit #: 2  History of Present Illness:   Concussion Self-Reported Symptom Score Symptoms rated on a scale 1-6, in last 24 hours  Headache: 0   Pressure in head: 2 Neck pain: 2 Nausea or vomiting: 0 Dizziness: 0  Blurred vision: 0  Balance problems: 1 Sensitivity to light:  4 Sensitivity to noise: 3 Feeling slowed down: 2 Feeling like "in a fog": 3 Don't feel right": 0 Difficulty concentrating: 2 Difficulty remembering: 2 Fatigue or low energy: 4 Confusion: 0 Drowsiness: 4 More emotional: 2 Irritability: 2 Sadness: 0 Nervous or anxious: 0 Trouble falling asleep: 0   Total # of Symptoms: 13/22 Total Symptom Score: 33/132  Previous Total # of Symptoms: 19/22 Previous Symptom Score: 80/132  Tinnitus: Yes  Review of Systems: No fevers or chills    Review of History: History of seizures currently controlled  Objective:    Physical Examination Vitals:   02/20/24 1524  BP: 126/82  Pulse: 91  SpO2: 95%   MSK: Normal cervical motion Neuro: Alert and oriented normal coordination and balance Psych: Speech thought process and affect.      Assessment and Plan   33 y.o. female with concussion.  Improving symptoms.  Extended release Adderall has been helpful.  She is not quite sure if it is right for her yet but will continue to give it a try.  She will report back in a few weeks and we will consider  using the immediate release Adderall in the future.    Recheck in a month.    Action/Discussion: Reviewed diagnosis, management options, expected outcomes, and the reasons for scheduled and emergent follow-up. Questions were adequately answered. Patient expressed verbal understanding and agreement with the following plan.     Patient Education: Reviewed with patient the risks (i.e, a repeat concussion, post-concussion syndrome, second-impact syndrome) of returning to play prior to complete resolution, and thoroughly reviewed the signs and symptoms of concussion.Reviewed need for complete resolution of all symptoms, with rest AND exertion, prior to return to play. Reviewed red flags for urgent medical evaluation: worsening symptoms, nausea/vomiting, intractable headache, musculoskeletal changes, focal neurological deficits. [facility] Plan, which clearly outlines the plans stated above, was given to patient.   Level of service: Total encounter time 30 minutes including face-to-face time with the patient and, reviewing past medical record, and charting on the date of service.        After Visit Summary printed out and provided to patient as appropriate.  The above documentation has been reviewed and is accurate and complete Kathy [name]

## 2024-02-20 NOTE — Patient Instructions (Addendum)
 Thank you for coming in today.   Let me know how the Adderall goes in a week or 2.  Check back in 1 month

## 2024-04-01 ENCOUNTER — Encounter: Admitting: Family Medicine

## 2024-06-14 ENCOUNTER — Ambulatory Visit: Admitting: Neurology
